# Patient Record
Sex: Female | Born: 1945 | Race: Black or African American | Hispanic: No | Smoking: Never smoker
Health system: Southern US, Community
[De-identification: ages and names within clinical notes are randomized; demographics above are authoritative.]

## PROBLEM LIST (undated history)

## (undated) DIAGNOSIS — E039 Hypothyroidism, unspecified: Secondary | ICD-10-CM

## (undated) DIAGNOSIS — A4902 Methicillin resistant Staphylococcus aureus infection, unspecified site: Secondary | ICD-10-CM

## (undated) DIAGNOSIS — R569 Unspecified convulsions: Secondary | ICD-10-CM

## (undated) DIAGNOSIS — I739 Peripheral vascular disease, unspecified: Secondary | ICD-10-CM

## (undated) DIAGNOSIS — F32A Depression, unspecified: Secondary | ICD-10-CM

## (undated) DIAGNOSIS — Z86718 Personal history of other venous thrombosis and embolism: Secondary | ICD-10-CM

## (undated) DIAGNOSIS — D649 Anemia, unspecified: Secondary | ICD-10-CM

## (undated) DIAGNOSIS — I639 Cerebral infarction, unspecified: Secondary | ICD-10-CM

## (undated) DIAGNOSIS — R130 Aphagia: Secondary | ICD-10-CM

## (undated) DIAGNOSIS — Z789 Other specified health status: Secondary | ICD-10-CM

## (undated) DIAGNOSIS — F329 Major depressive disorder, single episode, unspecified: Secondary | ICD-10-CM

## (undated) DIAGNOSIS — I82409 Acute embolism and thrombosis of unspecified deep veins of unspecified lower extremity: Secondary | ICD-10-CM

## (undated) DIAGNOSIS — I779 Disorder of arteries and arterioles, unspecified: Secondary | ICD-10-CM

## (undated) DIAGNOSIS — K5790 Diverticulosis of intestine, part unspecified, without perforation or abscess without bleeding: Secondary | ICD-10-CM

## (undated) DIAGNOSIS — I10 Essential (primary) hypertension: Secondary | ICD-10-CM

## (undated) HISTORY — PX: FEMORAL-POPLITEAL BYPASS GRAFT: SHX937

## (undated) HISTORY — DX: Personal history of other venous thrombosis and embolism: Z86.718

## (undated) HISTORY — DX: Methicillin resistant Staphylococcus aureus infection, unspecified site: A49.02

## (undated) HISTORY — DX: Unspecified convulsions: R56.9

## (undated) HISTORY — PX: ABOVE KNEE LEG AMPUTATION: SUR20

---

## 2001-07-31 ENCOUNTER — Encounter: Payer: Self-pay | Admitting: *Deleted

## 2001-07-31 ENCOUNTER — Inpatient Hospital Stay (HOSPITAL_COMMUNITY): Admission: EM | Admit: 2001-07-31 | Discharge: 2001-08-11 | Payer: Self-pay | Admitting: *Deleted

## 2001-07-31 ENCOUNTER — Encounter: Payer: Self-pay | Admitting: Family Medicine

## 2001-08-02 ENCOUNTER — Encounter: Payer: Self-pay | Admitting: Family Medicine

## 2001-08-11 ENCOUNTER — Inpatient Hospital Stay (HOSPITAL_COMMUNITY)
Admission: AD | Admit: 2001-08-11 | Discharge: 2001-09-02 | Payer: Self-pay | Admitting: Physical Medicine & Rehabilitation

## 2001-08-12 ENCOUNTER — Encounter: Admission: RE | Admit: 2001-08-12 | Discharge: 2001-08-12 | Payer: Self-pay | Admitting: Family Medicine

## 2001-08-14 ENCOUNTER — Encounter: Payer: Self-pay | Admitting: Physical Medicine & Rehabilitation

## 2001-08-25 ENCOUNTER — Encounter: Payer: Self-pay | Admitting: Physical Medicine & Rehabilitation

## 2001-10-13 ENCOUNTER — Ambulatory Visit (HOSPITAL_COMMUNITY)
Admission: RE | Admit: 2001-10-13 | Discharge: 2001-10-13 | Payer: Self-pay | Admitting: Physical Medicine & Rehabilitation

## 2001-10-13 ENCOUNTER — Encounter: Payer: Self-pay | Admitting: Physical Medicine & Rehabilitation

## 2001-10-25 ENCOUNTER — Encounter: Payer: Self-pay | Admitting: Emergency Medicine

## 2001-10-25 ENCOUNTER — Observation Stay (HOSPITAL_COMMUNITY): Admission: EM | Admit: 2001-10-25 | Discharge: 2001-10-26 | Payer: Self-pay

## 2001-10-25 ENCOUNTER — Encounter: Payer: Self-pay | Admitting: Internal Medicine

## 2001-11-02 ENCOUNTER — Encounter: Payer: Self-pay | Admitting: Emergency Medicine

## 2001-11-02 ENCOUNTER — Inpatient Hospital Stay (HOSPITAL_COMMUNITY): Admission: EM | Admit: 2001-11-02 | Discharge: 2001-11-07 | Payer: Self-pay | Admitting: Emergency Medicine

## 2001-11-02 ENCOUNTER — Encounter: Payer: Self-pay | Admitting: Internal Medicine

## 2001-11-06 ENCOUNTER — Encounter (INDEPENDENT_AMBULATORY_CARE_PROVIDER_SITE_OTHER): Payer: Self-pay | Admitting: Cardiology

## 2001-11-19 ENCOUNTER — Inpatient Hospital Stay (HOSPITAL_COMMUNITY): Admission: EM | Admit: 2001-11-19 | Discharge: 2001-11-26 | Payer: Self-pay | Admitting: Emergency Medicine

## 2002-06-19 ENCOUNTER — Emergency Department (HOSPITAL_COMMUNITY): Admission: EM | Admit: 2002-06-19 | Discharge: 2002-06-19 | Payer: Self-pay | Admitting: Emergency Medicine

## 2002-06-19 ENCOUNTER — Encounter: Payer: Self-pay | Admitting: Emergency Medicine

## 2002-08-14 ENCOUNTER — Ambulatory Visit (HOSPITAL_COMMUNITY): Admission: RE | Admit: 2002-08-14 | Discharge: 2002-08-14 | Payer: Self-pay | Admitting: Neurology

## 2002-08-14 ENCOUNTER — Encounter: Payer: Self-pay | Admitting: Neurology

## 2002-08-26 ENCOUNTER — Ambulatory Visit (HOSPITAL_COMMUNITY): Admission: RE | Admit: 2002-08-26 | Discharge: 2002-08-26 | Payer: Self-pay | Admitting: Neurology

## 2002-09-25 ENCOUNTER — Inpatient Hospital Stay (HOSPITAL_COMMUNITY): Admission: EM | Admit: 2002-09-25 | Discharge: 2002-09-26 | Payer: Self-pay | Admitting: Emergency Medicine

## 2002-09-25 ENCOUNTER — Encounter: Payer: Self-pay | Admitting: Emergency Medicine

## 2002-09-26 ENCOUNTER — Encounter: Payer: Self-pay | Admitting: Internal Medicine

## 2002-09-26 ENCOUNTER — Encounter: Payer: Self-pay | Admitting: Neurology

## 2003-02-11 ENCOUNTER — Emergency Department (HOSPITAL_COMMUNITY): Admission: AD | Admit: 2003-02-11 | Discharge: 2003-02-11 | Payer: Self-pay

## 2003-10-09 ENCOUNTER — Inpatient Hospital Stay (HOSPITAL_COMMUNITY): Admission: EM | Admit: 2003-10-09 | Discharge: 2003-10-14 | Payer: Self-pay

## 2004-01-26 ENCOUNTER — Ambulatory Visit: Payer: Self-pay | Admitting: *Deleted

## 2004-02-01 ENCOUNTER — Ambulatory Visit: Payer: Self-pay | Admitting: Family Medicine

## 2004-06-05 ENCOUNTER — Ambulatory Visit: Payer: Self-pay | Admitting: Family Medicine

## 2004-10-04 ENCOUNTER — Ambulatory Visit: Payer: Self-pay | Admitting: Family Medicine

## 2005-06-14 ENCOUNTER — Inpatient Hospital Stay (HOSPITAL_COMMUNITY): Admission: EM | Admit: 2005-06-14 | Discharge: 2005-07-18 | Payer: Self-pay

## 2005-06-14 ENCOUNTER — Ambulatory Visit: Payer: Self-pay | Admitting: Pulmonary Disease

## 2005-06-14 ENCOUNTER — Ambulatory Visit: Payer: Self-pay | Admitting: Internal Medicine

## 2005-08-15 ENCOUNTER — Ambulatory Visit: Payer: Self-pay | Admitting: Family Medicine

## 2005-09-10 ENCOUNTER — Encounter: Admission: RE | Admit: 2005-09-10 | Discharge: 2005-09-11 | Payer: Self-pay | Admitting: Family Medicine

## 2005-09-16 ENCOUNTER — Ambulatory Visit: Payer: Self-pay | Admitting: Cardiology

## 2005-09-16 ENCOUNTER — Ambulatory Visit: Payer: Self-pay | Admitting: Internal Medicine

## 2005-09-16 ENCOUNTER — Inpatient Hospital Stay (HOSPITAL_COMMUNITY): Admission: EM | Admit: 2005-09-16 | Discharge: 2005-09-21 | Payer: Self-pay | Admitting: Emergency Medicine

## 2005-09-17 ENCOUNTER — Encounter: Payer: Self-pay | Admitting: Cardiology

## 2005-10-04 ENCOUNTER — Inpatient Hospital Stay (HOSPITAL_COMMUNITY): Admission: EM | Admit: 2005-10-04 | Discharge: 2005-10-10 | Payer: Self-pay | Admitting: Emergency Medicine

## 2005-10-08 ENCOUNTER — Encounter (INDEPENDENT_AMBULATORY_CARE_PROVIDER_SITE_OTHER): Payer: Self-pay | Admitting: Specialist

## 2005-10-16 ENCOUNTER — Inpatient Hospital Stay (HOSPITAL_COMMUNITY): Admission: EM | Admit: 2005-10-16 | Discharge: 2005-10-19 | Payer: Self-pay | Admitting: Emergency Medicine

## 2006-03-12 ENCOUNTER — Emergency Department (HOSPITAL_COMMUNITY): Admission: EM | Admit: 2006-03-12 | Discharge: 2006-03-12 | Payer: Self-pay | Admitting: Emergency Medicine

## 2006-06-29 ENCOUNTER — Emergency Department (HOSPITAL_COMMUNITY): Admission: EM | Admit: 2006-06-29 | Discharge: 2006-06-29 | Payer: Self-pay | Admitting: Emergency Medicine

## 2006-12-17 DIAGNOSIS — R32 Unspecified urinary incontinence: Secondary | ICD-10-CM | POA: Insufficient documentation

## 2006-12-17 DIAGNOSIS — E1165 Type 2 diabetes mellitus with hyperglycemia: Secondary | ICD-10-CM

## 2006-12-17 DIAGNOSIS — I10 Essential (primary) hypertension: Secondary | ICD-10-CM | POA: Insufficient documentation

## 2006-12-17 DIAGNOSIS — E1149 Type 2 diabetes mellitus with other diabetic neurological complication: Secondary | ICD-10-CM

## 2006-12-17 DIAGNOSIS — Z8679 Personal history of other diseases of the circulatory system: Secondary | ICD-10-CM | POA: Insufficient documentation

## 2006-12-17 DIAGNOSIS — R569 Unspecified convulsions: Secondary | ICD-10-CM | POA: Insufficient documentation

## 2006-12-17 HISTORY — DX: Unspecified convulsions: R56.9

## 2007-02-14 ENCOUNTER — Emergency Department (HOSPITAL_COMMUNITY): Admission: EM | Admit: 2007-02-14 | Discharge: 2007-02-14 | Payer: Self-pay | Admitting: Emergency Medicine

## 2007-02-17 ENCOUNTER — Encounter (HOSPITAL_BASED_OUTPATIENT_CLINIC_OR_DEPARTMENT_OTHER): Admission: RE | Admit: 2007-02-17 | Discharge: 2007-05-06 | Payer: Self-pay | Admitting: Surgery

## 2007-04-18 ENCOUNTER — Inpatient Hospital Stay (HOSPITAL_COMMUNITY): Admission: EM | Admit: 2007-04-18 | Discharge: 2007-04-21 | Payer: Self-pay | Admitting: Emergency Medicine

## 2007-05-26 ENCOUNTER — Encounter (HOSPITAL_BASED_OUTPATIENT_CLINIC_OR_DEPARTMENT_OTHER): Admission: RE | Admit: 2007-05-26 | Discharge: 2007-06-17 | Payer: Self-pay | Admitting: Surgery

## 2007-07-13 ENCOUNTER — Emergency Department (HOSPITAL_COMMUNITY): Admission: EM | Admit: 2007-07-13 | Discharge: 2007-07-13 | Payer: Self-pay | Admitting: Emergency Medicine

## 2007-07-28 ENCOUNTER — Emergency Department (HOSPITAL_COMMUNITY): Admission: EM | Admit: 2007-07-28 | Discharge: 2007-07-28 | Payer: Self-pay | Admitting: Emergency Medicine

## 2007-11-12 ENCOUNTER — Inpatient Hospital Stay (HOSPITAL_COMMUNITY): Admission: EM | Admit: 2007-11-12 | Discharge: 2007-11-20 | Payer: Self-pay | Admitting: Emergency Medicine

## 2008-05-24 ENCOUNTER — Emergency Department (HOSPITAL_COMMUNITY): Admission: EM | Admit: 2008-05-24 | Discharge: 2008-05-24 | Payer: Self-pay | Admitting: Emergency Medicine

## 2008-10-12 ENCOUNTER — Ambulatory Visit (HOSPITAL_COMMUNITY): Admission: RE | Admit: 2008-10-12 | Discharge: 2008-10-12 | Payer: Self-pay | Admitting: *Deleted

## 2009-12-01 ENCOUNTER — Ambulatory Visit: Payer: Self-pay | Admitting: Vascular Surgery

## 2010-01-14 ENCOUNTER — Inpatient Hospital Stay (HOSPITAL_COMMUNITY): Admission: EM | Admit: 2010-01-14 | Discharge: 2010-01-20 | Payer: Self-pay | Admitting: Emergency Medicine

## 2010-02-01 ENCOUNTER — Ambulatory Visit: Payer: Self-pay | Admitting: Vascular Surgery

## 2010-03-01 ENCOUNTER — Telehealth: Payer: Self-pay | Admitting: Family Medicine

## 2010-04-19 ENCOUNTER — Emergency Department (HOSPITAL_COMMUNITY)
Admission: EM | Admit: 2010-04-19 | Discharge: 2010-04-19 | Payer: Self-pay | Source: Home / Self Care | Admitting: Emergency Medicine

## 2010-05-30 NOTE — Progress Notes (Signed)
Summary: PT RE-EST?  Phone Note Call from Patient   Caller: Patient's caregiver  [phone] Summary of Call: Dorinda Hill at [facility].... called to set up appt with Dr Clent Ridges for pt - Adv that pt will need to have order for abdominal US?.... Pt hasn't been seen since 2007 [facility].... Can you advise?.....[phone].  Initial call taken by: Debbra Riding,  March 01, 2010 10:32 AM  Follow-up for Phone Call        sorry but I am not taking new medicare pts now Follow-up by: Nelwyn Salisbury MD,  March 01, 2010 1:38 PM  Additional Follow-up for Phone Call Additional follow up Details #1::        Lft msg for Dorinda Hill at [phone] advising same.   Additional Follow-up by: Debbra Riding,  March 01, 2010 1:55 PM

## 2010-07-13 LAB — CBC
HCT: 31.6 % — ABNORMAL LOW (ref 36.0–46.0)
HCT: 35.1 % — ABNORMAL LOW (ref 36.0–46.0)
Hemoglobin: 11.6 g/dL — ABNORMAL LOW (ref 12.0–15.0)
MCHC: 33 g/dL (ref 30.0–36.0)
MCV: 84.2 fL (ref 78.0–100.0)
Platelets: 303 10*3/uL (ref 150–400)
Platelets: 377 10*3/uL (ref 150–400)
Platelets: 433 10*3/uL — ABNORMAL HIGH (ref 150–400)
RDW: 13.9 % (ref 11.5–15.5)
RDW: 14.5 % (ref 11.5–15.5)
RDW: 14.6 % (ref 11.5–15.5)
WBC: 10.3 10*3/uL (ref 4.0–10.5)
WBC: 10.9 10*3/uL — ABNORMAL HIGH (ref 4.0–10.5)

## 2010-07-13 LAB — DIFFERENTIAL
Lymphs Abs: 1 10*3/uL (ref 0.7–4.0)
Monocytes Absolute: 0.4 10*3/uL (ref 0.1–1.0)
Monocytes Relative: 4 % (ref 3–12)
Neutro Abs: 9.3 10*3/uL — ABNORMAL HIGH (ref 1.7–7.7)
Neutrophils Relative %: 86 % — ABNORMAL HIGH (ref 43–77)

## 2010-07-13 LAB — BASIC METABOLIC PANEL
BUN: 31 mg/dL — ABNORMAL HIGH (ref 6–23)
BUN: 53 mg/dL — ABNORMAL HIGH (ref 6–23)
BUN: 56 mg/dL — ABNORMAL HIGH (ref 6–23)
CO2: 22 mEq/L (ref 19–32)
CO2: 25 mEq/L (ref 19–32)
Calcium: 8.6 mg/dL (ref 8.4–10.5)
Calcium: 9.3 mg/dL (ref 8.4–10.5)
Chloride: 112 mEq/L (ref 96–112)
Chloride: 113 mEq/L — ABNORMAL HIGH (ref 96–112)
Creatinine, Ser: 1.3 mg/dL — ABNORMAL HIGH (ref 0.4–1.2)
Creatinine, Ser: 2.03 mg/dL — ABNORMAL HIGH (ref 0.4–1.2)
Creatinine, Ser: 3.11 mg/dL — ABNORMAL HIGH (ref 0.4–1.2)
GFR calc Af Amer: 41 mL/min — ABNORMAL LOW (ref >60)
GFR calc non Af Amer: 15 mL/min — ABNORMAL LOW (ref >60)
GFR calc non Af Amer: 25 mL/min — ABNORMAL LOW (ref >60)
GFR calc non Af Amer: 27 mL/min — ABNORMAL LOW (ref >60)
GFR calc non Af Amer: 34 mL/min — ABNORMAL LOW (ref >60)
Glucose, Bld: 144 mg/dL — ABNORMAL HIGH (ref 70–99)
Glucose, Bld: 251 mg/dL — ABNORMAL HIGH (ref 70–99)
Glucose, Bld: 280 mg/dL — ABNORMAL HIGH (ref 70–99)
Glucose, Bld: 85 mg/dL (ref 70–99)
Potassium: 3.3 mEq/L — ABNORMAL LOW (ref 3.5–5.1)
Potassium: 3.6 mEq/L (ref 3.5–5.1)
Potassium: 5.1 mEq/L (ref 3.5–5.1)
Sodium: 138 mEq/L (ref 135–145)

## 2010-07-13 LAB — GLUCOSE, CAPILLARY
Glucose-Capillary: 122 mg/dL — ABNORMAL HIGH (ref 70–99)
Glucose-Capillary: 136 mg/dL — ABNORMAL HIGH (ref 70–99)
Glucose-Capillary: 143 mg/dL — ABNORMAL HIGH (ref 70–99)
Glucose-Capillary: 172 mg/dL — ABNORMAL HIGH (ref 70–99)
Glucose-Capillary: 191 mg/dL — ABNORMAL HIGH (ref 70–99)
Glucose-Capillary: 216 mg/dL — ABNORMAL HIGH (ref 70–99)
Glucose-Capillary: 239 mg/dL — ABNORMAL HIGH (ref 70–99)
Glucose-Capillary: 271 mg/dL — ABNORMAL HIGH (ref 70–99)
Glucose-Capillary: 289 mg/dL — ABNORMAL HIGH (ref 70–99)
Glucose-Capillary: 308 mg/dL — ABNORMAL HIGH (ref 70–99)
Glucose-Capillary: 335 mg/dL — ABNORMAL HIGH (ref 70–99)
Glucose-Capillary: 353 mg/dL — ABNORMAL HIGH (ref 70–99)
Glucose-Capillary: 355 mg/dL — ABNORMAL HIGH (ref 70–99)
Glucose-Capillary: 56 mg/dL — ABNORMAL LOW (ref 70–99)
Glucose-Capillary: 75 mg/dL (ref 70–99)

## 2010-07-13 LAB — COMPREHENSIVE METABOLIC PANEL
ALT: 22 U/L (ref 0–35)
AST: 56 U/L — ABNORMAL HIGH (ref 0–37)
Albumin: 3.1 g/dL — ABNORMAL LOW (ref 3.5–5.2)
Alkaline Phosphatase: 41 U/L (ref 39–117)
Potassium: 4.4 mEq/L (ref 3.5–5.1)
Sodium: 143 mEq/L (ref 135–145)
Total Protein: 7.3 g/dL (ref 6.0–8.3)

## 2010-07-13 LAB — URINE CULTURE
Colony Count: NO GROWTH
Culture  Setup Time: 201109171108
Culture: NO GROWTH

## 2010-07-13 LAB — URINALYSIS, ROUTINE W REFLEX MICROSCOPIC
Bilirubin Urine: NEGATIVE
Ketones, ur: NEGATIVE mg/dL
Nitrite: NEGATIVE
Protein, ur: >300 mg/dL — AB
Urobilinogen, UA: 1 mg/dL (ref 0.0–1.0)

## 2010-07-13 LAB — CULTURE, BLOOD (ROUTINE X 2): Culture  Setup Time: 201109171107

## 2010-07-13 LAB — MRSA CULTURE

## 2010-07-13 LAB — LACTIC ACID, PLASMA: Lactic Acid, Venous: 1.4 mmol/L (ref 0.5–2.2)

## 2010-07-13 LAB — URINE MICROSCOPIC-ADD ON

## 2010-07-13 LAB — MRSA PCR SCREENING

## 2010-08-02 ENCOUNTER — Ambulatory Visit: Payer: Self-pay | Admitting: Vascular Surgery

## 2010-09-12 NOTE — Assessment & Plan Note (Signed)
[facility]   NAME:  Allison Washington NO.:  [id number]   MEDICAL RECORD NO.:  [id number]      DATE OF BIRTH:  Oct 21, 1945   PHYSICIAN:  Jake Shark A. Tanda Rockers, M.D. VISIT DATE:  06/09/2007                                   OFFICE VISIT   SUBJECTIVE:  Allison Washington is a 65 year old female who we are seeing  for combined arterial insufficiency ulcers and stasis ulcer involving  her right lower extremity.  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  In the interim she has used antiseptic soap wash  and daily application of triamcinolone cream.  She is mobile using a  motorized wheelchair.  There has been no interim fever.  She is  accompanied by a driver only.   OBJECTIVE:  Blood pressure is 117/74, respirations 18, pulse rate 98,  temperature 98.3.  Capillary blood glucose is 142 mg percent.  Inspection of the left lower extremity shows that the ulcers labeled 1A  and 2A have soft eschars.  There is no drainage, there is no evidence of  active infection.  The capillary refill is sluggish and consistent with  a known level of vascular insufficiency.  The foot is warm, but it is  certainly not ischemic in appearance.  The motor ability is preserved.  No debridement is needed.   ASSESSMENT:  Stable arterial and venous ulcerations.   PLAN:  We will continue daily antiseptic soap wash with triamcinolone  cream application.  We have written these instructions for the benefit  of the nursing staff at [facility].  We will discharge this patient  from active followup in the wound center.  We have indicated that we are  willing to reevaluate her on a p.r.n. basis or if the institution  requires that we perform all of her care.  Otherwise, these wound care  orders may be executed by the staff at [facility]      Harold A. Tanda Rockers, M.D.  Electronically Signed     HAN/MEDQ  D:  06/09/2007  T:  06/10/2007  Job:  [id number]   cc:   Maxwell Caul, M.D.

## 2010-09-12 NOTE — Assessment & Plan Note (Signed)
[facility]   NAME:  TALULAH, SCHIRMER            ACCOUNT NO.:  [id number]   MEDICAL RECORD NO.:  [id number]      DATE OF BIRTH:  02/25/1946   PHYSICIAN:  Jake Shark A. Tanda Rockers, M.D. VISIT DATE:  03/06/2007                                   OFFICE VISIT   SUBJECTIVE:  Ms. Iseminger is a 65 year old female who we have followed  for stasis ulcer involving the left lower extremity.  In the interim,  the patient has been treated with external compression and triamcinolone  ointment.  She has been keeping the leg elevated.  There has been no  interim pain or fever or excessive drainage.   SUBJECTIVE:  Blood pressure is 145/68, respirations 18, pulse rate 93,  temperature is 99.0.  Capillary blood glucose is 158 mg percent.  Inspection of the lower extremity shows that there has been significant  decrease in volume and area of the wound.  There is no evidence of  active infection.  The patient has responded to external compression.   ASSESSMENT:  Near resolved stasis ulcer.   PLAN:  We will discharge the patient from active follow-up in the Wound  Center.  She will be returned to the nursing facility with instructions  to continue to apply the triamcinolone ointment daily and to apply a 4x4  and an Ace wrap for compression.  We anticipate that this wound should  heal without difficulty.  The patient is discharged.      Harold A. Tanda Rockers, M.D.  Electronically Signed     HAN/MEDQ  D:  03/06/2007  T:  03/06/2007  Job:  [id number]   cc:   Maxwell Caul, M.D.

## 2010-09-12 NOTE — Assessment & Plan Note (Signed)
[facility]   NAME:  Allison Washington, Allison Washington            ACCOUNT NO.:  [id number]   MEDICAL RECORD NO.:  [id number]      DATE OF BIRTH:  27-Jan-1946   PHYSICIAN:  Jonelle Sports. Sevier, M.D.  VISIT DATE:  04/16/2007                                   OFFICE VISIT   HISTORY:  This 65 year old female is followed for stasis ulceration on  the anterior aspect of the left leg, which was thought to have healed  approximately 1 month ago.  She is in a chronic care facility, and they  noted that she had apparently sustained a very superficial abrasion  above this area on the left pretibial area that has been somewhat weepy.  Accordingly, they have referred her back for our evaluation and advice.  On arrival here, she clearly has a recurrence of the ulcer in the  original place and it is filled with a fibrous semi-necrotic slough.   The patient reports no pain.  No fever or systemic symptoms.  She is  unaware of any significant odor and there has been very little drainage  from the primary wound but some just simple weeping from the secondary  wound.   PHYSICAL EXAMINATION:  Blood pressure is 108/61, pulse 90, respirations  16, temperature 98.2.   On the lower anterior aspect of the left pretibial area is an ulcer  measuring 1.2 x 1.0 x 0.25 cm filled with purulent fibrinous semi-  necrotic slough.  There is some surrounding erythema here, but there is  absolutely no warmth and it does not appear to be infectious in nature.   On the higher pretibial area, there is an area now measuring  approximately 3 x 3 cm that is just superficially denuded and there is  some slight weeping.  Again, there is no evidence of significant  infection.   IMPRESSION:  Stasis ulceration, left lower extremity, in a woman whose  circulatory status will not permit compressive therapy.   DISPOSITION:  The primary wound is sharply debrided to the extent that  it can be tolerated of a considerable amount  of this fibrinous semi-  necrotic material.  The base of the wound is cross-hatched.  The wound  is subjected to an application of Accuzyme ointment covered by a Sofsorb  pad.   The secondary wound is covered with a silver line pad and then that  extremity is wrapped lightly in a Kerlix wrap, over which it is placed  in an Ace wrap from the base of the toes to the knee.   The receiving facility is advised to change the dressing every 2 days,  using Santyl ointment in the primary wound in place of the  Accuzyme.  They will also continue silver line application to secondary wound and  will keep that extremity wrapped in an Ace wrap and elevated as much as  possible.   Followup visit here will be in 2 weeks.          ______________________________  Jonelle Sports Cheryll Cockayne, M.D.    RES/MEDQ  D:  04/16/2007  T:  04/16/2007  Job:  [id number]

## 2010-09-12 NOTE — Discharge Summary (Signed)
NAME:  Allison Washington, Allison Washington            ACCOUNT NO.:  [id number]   MEDICAL RECORD NO.:  [id number]          PATIENT TYPE:  INP   LOCATION:  1337                         FACILITY:  [facility]   PHYSICIAN:  Herbie Saxon, MDDATE OF BIRTH:  09/17/1945   DATE OF ADMISSION:  11/11/2007  DATE OF DISCHARGE:                               DISCHARGE SUMMARY   DISCHARGE DIAGNOSES:  1. Hypoglycemic episode resolved.  2. Diabetes improved control.  3. Dehydration resolved.  4. Anemia of chronic disease with iron deficiency.  5. Mild urinary tract infection.  6. Methicillin-resistant Staphylococcus aureus wound infection.  7. Sacral, left leg ulcers.  8. 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  9. 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  10.Hypertension stable.  11.History of coronary artery disease.  12.History of hyperlipidemia on statin.  13.History of diverticulosis.  14.Also has a history of gastrointestinal bleed.  15.Status epilepticus.   RADIOLOGY:  The renal ultrasound of November 15, 2007, shows normal course  apparent of the kidneys.  Chest x-ray of November 11, 2007, shows no acute  findings with chronic interstitial change noted.   HOSPITAL COURSE:  This 65 year old female who is an assisted living  facility resident presented to emergency room with low blood sugar of  44, given IV dextrose and sugar rose to 200.  Patient was initially  having dizzy spells prior to presentation.  Patient admitted and started  on q.4 hourly glucose Cheks with history of mild UTI on the urinalysis  and mild leukocytosis, started on Avelox.  Also noticed to have a mild  dehydration, occurring  heart failure.  This is improving with the IV  fluid hydration with 1/2 normal saline.  Sacral right buttock blisters  were noted and wound culture was sent which was positive for MRSA.  Her  antibiotic coverage has been widened to cover against  pseudomonas and  MRSA.  She is presently on IV Cipro and vancomycin.  Also noted to have  elevated TSH on November 13, 2007.  Levothyroxine low-dose 25 mg daily has  been added to medication regimen.  A TSH is to be repeated in 6 weeks as  outpatient at the assisted living facility.  Patient is being followed  by the wound care nurse and she has been Aquagel application to left  heel and sacral ulcers.  Initially urticaria has improved.  Renal  function is back to baseline.  Currently she is on contact precautions.  Planning to discharge to the nursing facility next 48 hours.  Patient  started on IV vancomycin.  This is to be continued in the nursing  facility.   PHYSICAL EXAMINATION:  On examination today,elderly lady not in acute  distress.  Temperature 97.  Pulse 70.  Respiratory rate is 19.  Blood pressure  130/48.  Pupils equal, reactive to light and accommodation.  Has expressive  aphasia and dysarthria.  NECK:  Supple.  Oropharynx, nasopharynx are clear.  HEAD:  Atraumatic normocephalic.  CHEST:  Clear clinically.  Heart sounds are 1 and 2 regular rate and  rhythm.  No  rubs, gallops or murmurs.  ABDOMEN:  Benign.  He is alert, oriented x3 with a right above-knee amputation.  Stage 2  decubitus in the right left heel stage 2 ulcers.  Peripheral pulses  reduced.  No pedal edema.   LABORATORIES:  Wound cultures November 14, 2007, shows a moderate MRSA.  Chemistry of November 17, 2007, sodium is 140, potassium 4.2, chloride 109,  bicarbonate 26, glucose 156, BUN 23, creatinine 1.1, BBC is 7.7,  hematocrit 31, platelets 327.   PLAN:  Discontinue intravenous hydration, intravenous KVO, and Coreg.  Liberal oral fluids.  CSW input in arranging discharge plan to the  nursing facility in the next 24-48 hours.  Discharge medication  previously dictated at final discharge.      Herbie Saxon, MD  Electronically Signed     MIO/MEDQ  D:  11/17/2007  T:  11/17/2007  Job:  [id number]

## 2010-09-12 NOTE — Consult Note (Signed)
NAME:  Allison Washington, Allison Washington            ACCOUNT NO.:  [id number]   MEDICAL RECORD NO.:  [id number]          PATIENT TYPE:  REC   LOCATION:  FOOT                         FACILITY:  [facility]   PHYSICIAN:  Theresia Majors. Tanda Rockers, M.D.DATE OF BIRTH:  1945-06-23   DATE OF CONSULTATION:  DATE OF DISCHARGE:                                 CONSULTATION   REASON FOR CONSULTATION:  Allison Washington is a 65 year old diabetic who is  referred by Allison Washington for evaluation of bilateral ulcers  referred for the evaluation of an ulceration of the left lower  extremity.   IMPRESSION:  Combined arterial insufficiency and venous stasis  ulceration.   RECOMMENDATIONS:  Proceed with a full battery of noninvasive studies to  ascertain the relative contribution of arterial insufficiency.  In the  interim use a mild to no compression wrap instituting continuous  elevation of the left leg to control edema.  The patient will require  serial exams and possibly a more aggressive debridement as the areas of  slough are resolved.   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  She has been a resident of the nursing care facility for  the past 2 years.   The current also has been present for 2 weeks and was initiated with a  pump to the underside of the bed while utilizing a motorized wheelchair.  There has been no interim fever.  There has been moderate pain.  Edema  has decreased somewhat from the initial injury.   PAST MEDICAL HISTORY:  Her past medical history is remarkable for  previous peripheral vascular disease, hypertension.  She has had  multiple strokes.  She has suffered with diabetes for over a decade.   ALLERGIES:  She has no known allergies.   MEDICATIONS:  Her current medications include Lantus insulin 40 units in  the  morning and 45 in the evening, Detrol LA 4 mg daily, KCl 20 mEq  daily, simvastatin 80 mEq h.s., MiraLax 17 grams daily, Colace 100 mg  daily, multiple vitamins one daily, vitamin C 500 mg b.i.d., Mucinex 600  mg q.12h., Tricor 145 mg daily, omeprazole 20 mg daily, lisinopril 40  mEq daily, Norvasc 10 mEq daily, labetalol 20 mg b.i.d., and clonidine  0.1 mg b.i.d.   PAST SURGICAL HISTORY:  Previous surgeries included a cholecystectomy  and an above-the-knee amputation of the right leg for suppurative  complications.   FAMILY HISTORY:  Her family history is positive for diabetes, stroke and  cancer.   SOCIAL HISTORY:  Socially she is a widow.  She has two children who live  in the local area and who visit her. She is a retired Engineer, civil (consulting).   REVIEW OF SYSTEMS:  She has never smoked.  She is ambulatory minimally  which involves shifting from the bed to her motorized wheelchair.  She  does not do any cooking or washing and needs assistance with her  bathing.  She has had significant impediment to speaking.  Her visual  acuity has decreased.  She specifically denies angina pectoris.  There  are no bowel or bladder complaints.  Her weight is stable.  Her diabetes  has been under reasonable control with the previously described regimen.  She has had episodes of hematochezia and has had extensive workup at  [facility] with upper and lower endoscopy.  She has received  transfusions in the course of the management of her gastrointestinal  hemorrhage.  The remainder of the review of systems is negative.   PHYSICAL EXAMINATION:  GENERAL APPEARANCE: On physical exam she is an  alert, chronically ill female in a motorized wheelchair.  She is in no  acute distress.  She appears to be alert and oriented to time, place and  person.  VITAL SIGNS:  Blood pressure is 149/73, respirations 18, pulse rate 97,  temperature is 98.6. Capillary blood glucose is 108 mg percent.  HEENT:  Exam is remarkable for a  flattened facial crease on the left.  The patient has significant difficulty in phonation and expressive  aphasia.  NECK:  The neck is supple. The trachea is midline.  There are large  airway sounds with rhonchi which clear with cough.  CARDIORESPIRATORY:  The heart sounds were distant.  The lung fields in  general are clear.  ABDOMEN:  The abdomen is soft.  There is a healed right above-the-knee  amputation stump. On the left there is taut 3+ edema with hyperemia.  Capillary refill is sluggish.  There are multiple excoriated areas with  ulcerations over the anterior lateral shin. There is no evidence of  ascending infection.  The capillary refill is sluggish.  The ankle-  brachial index calculated in the clinic is 0.50 with extremely dampened  monophasic signals.  NEUROLOGICAL:  The patient is insensate to the [employer].   DISCUSSION:  This 65 year old diabetic has dependent edema in her  remaining left lower extremity.  She has a post-traumatic stasis ulcer  at this point.  We will judiciously balance compression to control edema  and avoid arterial compromise.  We have explained this approach to the  patient in terms that she seems to understand.  The patient also informs  me that she has been extensively evaluated vascular wise both here and  in [place] and has not been advised to undergo surgery.  If there is  any deterioration in her wound care over the next visit, we will  recommend a confirmatory vascular consult.      Harold A. Tanda Rockers, M.D.  Electronically Signed     HAN/MEDQ  D:  02/19/2007  T:  02/20/2007  Job:  [id number]   cc:   Maxwell Caul, M.D.

## 2010-09-12 NOTE — Consult Note (Signed)
NAME:  KETARA, CAVNESS NO.:  [id number]   MEDICAL RECORD NO.:  [id number]          PATIENT TYPE:  EMS   LOCATION:  MAJO                         FACILITY:  [facility]   PHYSICIAN:  Pramod P. Pearlean Brownie, MD    DATE OF BIRTH:  1946/02/19   DATE OF CONSULTATION:  07/13/2007  DATE OF DISCHARGE:  07/13/2007                                 CONSULTATION   REASON FOR CONSULTATION:  Code Stroke.   HISTORY OF PRESENT ILLNESS:  Ms. Dillingham is a 65 year old African-  American lady who was brought in by the family with new onset of slurred  speech and left sided weakness. History was provided to the physician by  the patient's daughter-in-law, who unfortunately is not available at  present to corroborate the history. The patient also tells me that she  woke up at 6:30 in the morning and she had some pain on the left side of  the face, arm, leg, and chest and felt her speech was a little worse  than her baseline dysarthria. She denies any increasing weakness to me  but apparently the daughter-in-law felt she was weaker on the left. The  patient has a history of multiple one or two strokes and has been living  in a nursing home. She in fact, came back home yesterday from the  nursing home. The patient's family felt that they were not ready but the  patient insisted because there was a new grandchild that she wanted to  see. She apparently spent the night on the sofa bed. There was an  altercation last night, an argument with her son, which was upsetting to  her. The patient denies at present, any headache, any double vision, any  increased weakness.   PAST MEDICAL HISTORY:  1. Multiple strokes.  2. Diabetes mellitus.  3. Hypertension.  4. Diverticulitis.  5. Coronary artery disease.  6. Right above the knee amputation.   MEDICATIONS:  Tylenol, Lisinopril, MiraLax, Benadryl, Clonidine,  multivitamins, Colace, Norvasc, Detrol, Prilosec, __________ ,  Zocor,  potassium, Labetalol,  Lantus insulin, vitamin C, Vicodin, Tylenol,  Zocor, and Prilosec.   SOCIAL HISTORY:  The patient has been living in a nursing home for 2  years. Just got home yesterday as stated above. She does not smoke or  drink. She is unable to walk because of amputation. She uses a  wheelchair for mobility.   PHYSICAL EXAMINATION:  GENERAL:  A middle aged African-American lady who  is at the present, not in distress.  VITAL SIGNS:  She is afebrile with pulse rate of 91 per minute,  respiratory rate 22, blood pressure 181/78. Saturations 96% on room air.  HEENT:  Head is nontraumatic.  NECK:  Supple. No bruit.  CARDIAC:  Regular heart sounds.  LUNGS:  Clear to auscultation.  ABDOMEN:  Soft, nontender.  NEUROLOGIC:  The patient is awake, alert. She is oriented to person,  place, and time. She has severe dysarthria with she can be understood.  She has pseudobulbar speech. Her eye movements are full range. There is  minimum right lower facial asymmetry. Tongue midline. Her tongue  movements  are slow. Motor system examination reveals no upper extremity  drift. She has intact finger to nose and coordination. She has right  above the knee amputation. Left leg strength seems good. She has  decreased sensation to touch and pinprick in the left body. There is no  sensory extinction. On a NIH stroke scale, she is code 2. Her jaw jerk  is brisk. Deep tendon reflexes are brisk bilaterally. Plantar's are  downgoing. Gait was not tested.   LABORATORY DATA:  Non-contrast CT scan of the head shows multiple remote  age lacunar infarcts involving the pons and 1 or 2 subcortical white  matter and mild generalized atrophy and white matter changes, which are  age disproportionate.   CBC is normal. BMP is pending. UA is pending.   IMPRESSION:  A 65 year old lady with known history of bilateral  subcortical strokes and pseudobulbar state with transient worsening of  her speech, questionable increased left sided  weakness of unclear  etiology. I doubt this presents a new large stroke and due to the  minimum NIH stroke scale and poor neurological baseline and unclear  history, I would not consider invasive intervention with TPA. The  patient's family is unfortunately not available at the present time to  corroborate the history. She can be hydrated aggressively and check a UA  for infection. If she returns to baseline, she may go back home if the  family is able to take care of her. Otherwise, she may need to go back  to the nursing home, if they cannot take care of her. She will continue  aspirin for secondary stroke prevention and with treatment of blood  pressure, diabetes, and cholesterol. Apparent no future strokes.           ______________________________  Sunny Schlein. Pearlean Brownie, MD     PPS/MEDQ  D:  07/13/2007  T:  07/13/2007  Job:  [id number]

## 2010-09-12 NOTE — H&P (Signed)
NAME:  LENOIR, FACCHINI NO.:  [id number]   MEDICAL RECORD NO.:  [id number]          PATIENT TYPE:  OBV   LOCATION:  1337                         FACILITY:  [facility]   PHYSICIAN:  Della Goo, M.D. DATE OF BIRTH:  Oct 18, 1945   DATE OF ADMISSION:  11/11/2007  DATE OF DISCHARGE:                              HISTORY & PHYSICAL   PRIMARY CARE PHYSICIAN:  Unassigned.   CHIEF COMPLAINT:  Low blood sugar.   HISTORY OF THE PRESENT ILLNESS:  This is a 65 year old female who  resides at an area assisted living center who was sent to the emergency  department secondary to an episode of low blood sugar.  The patient was  evaluated in the emergency department and was found to have a blood  sugar level of 44 and the patient was administered IV dextrose times one  dosage, and had improvement to a blood sugar of 200.  The patient was  further evaluated.  The patient herself has dysarthria and severe  difficulty speaking secondary to five previous cerebrovascular  accidents, and is able to write down her thoughts.  The patient has  complaints of having dizziness.  She denies having any pain.   PAST MEDICAL AND SURGICAL HISTORY:  The past medical history is  significant for:  1. Previous cerebrovascular accidents times five.  2. History of Type 2 diabetes mellitus.  3. Hypertension.  4. History of peripheral vascular disease.  5. 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  6. Also history of coronary artery disease.  7. Diverticulosis/diverticulitis.   MEDICATIONS:  The patient's medications include:  1. Clonidine 0.1 mg by mouth twice a day.  2. Labetalol 200 mg 1 by mouth twice a day.  3. Lisinopril 40 mg 1 by mouth every day.  4. Simvastatin 40 mg 1 by mouth at bedtime.  5. Norvasc 10 mg 1 by mouth every day.  6. Duragesic patch 25 mcg 1 patch every 72 hours.  7. Detrol 4 mg 1 by mouth every day.  8. Colace 100 mg 1 by mouth every day.  9. Benadryl 25 mg 1 by  mouth every 6 hours as needed.  10.Lactescence insulin 45 units subcu at bedtime.  11.Vitamin C 500 mg 1 by mouth twice a day.  12.Multivitamin 1 by mouth every day.  13.MiraLax 17 grams in 8 ounces of fluid once daily.  14.Vicodin 5/500 mg 1 by mouth every 4 hours as needed for pain.  15.K-Dur 20 mEq 1 by mouth every day.   PAST SURGICAL HISTORY:  None.   ALLERGIES:  No known drug allergies.   SOCIAL HISTORY:  The patient resides in an area assisted living center.  She is a nonsmoker and a nondrinker.   FAMILY HISTORY:  The family history is noncontributory.   REVIEW OF SYSTEMS:  Pertinents are mentioned above.   PHYSICAL EXAMINATION:  The physical examination findings are:  GENERAL APPEARANCE:  This is a 65 year old obese, sickly female who is  debilitated and bed-bound, and in no acute distress currently.  VITAL SIGNS:  Temperature 98.5, blood pressure 116/59, heart rate 90,  respirations 18, and  O2 saturation 95-96%.  HEENT EXAMINATION:  Normocephalic and atraumatic.  There is a facial  droop on the right side.  Pupils are reactive to light bilaterally.  Extraocular movements are intact.  Oropharynx is clear.  There is tongue  deviation; all are chronic from her previous CVAs.  NECK:  The neck is supple with full range of motion.  No thyromegaly,  adenopathy or jugular venous distention.  HEART:  Cardiovascular reveals regular rate and rhythm.  No murmurs,  gallops or rubs.  LUNGS:  The lungs are clear to auscultation bilaterally.  ABDOMEN:  The abdomen is positive for an oblique scar in the right upper  quadrant consistent with a cholecystectomy.  Otherwise positive bowel  sounds, soft, nontender ad nondistended.  No hepatosplenomegaly.  No  rebound and no guarding.  EXTREMITIES:  The extremities show a right AKA present and the left  lower extremity without cyanosis, clubbing or edema.  There is atrophy.  NEUROLOGIC:  On neurologic examination patient is awake and  oriented.  She has significant dysarthria and expressive aphasia.  She has mild  movement in her right upper extremity.   LABORATORY STUDIES:  White blood cell count 10.9, hemoglobin 10.9,  hematocrit 32.9 and platelets 286,000 with neutrophils 66% and  lymphocytes 25% .  Sodium 142, potassium 4.5, chloride 110, bicarb 23,  BUN 52, creatinine 2.1, and glucose 44 on admission.  Cardiac enzymes  with a myoglobin of 441, CK/MB 3.3 and troponin less than 0.05.  Urinalysis with trace leukocyte esterase, but otherwise negative.  Chest  x-ray reveals no acute findings, but chronic interstitial changes are  seen.   ASSESSMENT:  This is a 65 year old female being admitted with:  1. Hypoglycemic episode.  2. Hypertension.  3. Coronary artery disease.  4. Anemia.  5. Type 2 diabetes mellitus.   PLAN:  1. The patient will be admitted and monitored for further changes in      her blood sugars.  2. The patient will continue on her regular medications at this time      and sliding scale insulin coverage.  3. The hypoglycemic protocol will be followed as needed low blood      sugars.  4. DVT and GI prophylaxes have also been ordered.  5. Case management will be requested for evaluation and placement of      the patient to a higher level of care to skilled nursing.      Della Goo, M.D.  Electronically Signed     HJ/MEDQ  D:  11/11/2007  T:  11/12/2007  Job:  [id number]

## 2010-09-12 NOTE — Assessment & Plan Note (Signed)
[facility]   NAME:  MAREENA, CAVAN            ACCOUNT NO.:  [id number]   MEDICAL RECORD NO.:  [id number]      DATE OF BIRTH:  [birth year]   PHYSICIAN:  Jake Shark A. Tanda Rockers, M.D. VISIT DATE:  02/26/2007                                   OFFICE VISIT   SUBJECTIVE:  Ms. Sant is a 65 year old female who we are following  for a left stasis ulcer.  Her ABI from her previous exam suggested  significant occlusive disease.  There has been no interim pain, fever or  malodor.   OBJECTIVE:  Blood pressure is 161/80, respirations 16, pulse 97,  temperature is 99.1.  Capillary blood glucose is 93 mg%.  Inspection of  the left lower extremity shows that the wounds have decreased in area  and volume.  There is advancing epithelium from the multiple areas.  There is no need for debridement.  The capillary refill is moderate.   ASSESSMENT:  Clinical improvement with mild compression and  triamcinolone ointment.   PLAN:  We will resume the noncompressive wrap while maintaining  elevation.  We will reevaluate the patient in 1 week.  With regard to  her vascular workup, we would defer the vascular referral at this point  as this patient would not be a candidate for revascularization due to  her relative immobility and the sequelae from her CVA.      Harold A. Tanda Rockers, M.D.  Electronically Signed     HAN/MEDQ  D:  02/26/2007  T:  02/26/2007  Job:  [id number]

## 2010-09-12 NOTE — Consult Note (Signed)
NEW PATIENT CONSULTATION   Allison, Washington  DOB:  Dec 30, 1945                                       12/01/2009  ZOXWR#:[id number]   I saw the patient in the office today in consultation concerning her  left leg pain.  She is referred by Dr. Chilton Si.  This patient has a  significant expressive aphasia related to a previous stroke and history  is somewhat difficult to obtain from her.  Her son is with her today,  and most of the history is obtained from him.  The pain in her left leg  began several months ago and came on gradually.  The pain is constant  and fairly severe.  She describes it as a cramping pain.  There are  really no aggravating or alleviating factors.  She does have some rest  pain of the left foot, which is relieved somewhat with dependency.  She  has had no history of nonhealing ulcers that she is aware of.   PAST MEDICAL HISTORY:  Significant for adult onset diabetes.  She does  require insulin.  In addition, she has hypertension and  hypercholesterolemia.  She has had a previous stroke associated with  dysarthria and expressive aphasia.  She also has a history of coronary  artery disease but denies any previous history of myocardial infarction  or history of congestive heart failure.  She has anemia of chronic  disease.   PAST SURGICAL HISTORY:  The patient underwent previous attempted  revascularization of the right leg in 2003 by Dr. Elyn Peers and then had a  right above-the-knee amputation by Dr. Arbie Cookey in 2007.   SOCIAL HISTORY:  She is married.  She is widowed.  She has 2 children.  She quit tobacco 10 years ago.   FAMILY HISTORY:  There is no history of premature cardiovascular  disease.   REVIEW OF SYSTEMS:  GENERAL:  She has had no recent weight loss, weight  gain, or problems with her appetite.  CARDIOVASCULAR:  She has had no recent chest pain, chest pressure,  palpitations or arrhythmias.  She does admit to some dyspnea on  exertion.   She has had no history of DVT according to her.  However,  according to our records, she has had a previous DVT.  GI:  She has had GI bleeding in the past.  She does not remember the  details.  She has had no recent change in her bowel habits.  NEUROLOGIC:  She has had a previous stroke.  She has had no recent  dizziness, blackouts, headaches or seizures.  PULMONARY, HEMATOLOGIC, GU, ENT, PSYCHIATRIC:  Review of systems is  unremarkable.  MUSCULOSKELETAL:  She does have some arthritis.   PHYSICAL EXAMINATION:  This is a pleasant 65 year old woman who appears  her stated age.  Her blood pressure is 106/62, heart rate is 92,  respiratory rate is 18.  HEENT:  Unremarkable.  Lungs:  Are clear  bilaterally to auscultation without rales, rhonchi or wheezing.  Cardiovascular exam:  I do not detect any carotid bruits.  She has a  regular rate and rhythm.  She has a diminished right femoral pulse.  I  cannot palpate a left femoral pulse.  She has a right above-the-knee  amputation, which is healed.  I cannot palpate popliteal or pedal pulses  on the left side.  She  has hyperpigmentation in the left calf and also  significant 3+ edema of the left calf.  Abdomen:  Soft and nontender  with normal-pitched bowel sounds.  No masses are appreciated.  Musculoskeletal exam:  She has a right AKA.  Neuro:  She has expressive  aphasia.  She has no focal weakness noted.  Skin:  She has  hyperpigmentation of the left leg with no open ulcers.   I did review her previous lab studies from her discharge summary in  2009, which showed a normal creatinine of 1.1.  I also reviewed her  culture from July, which showed MRSA.  This was from a sacral wound.  I  independently performed a Doppler study of her left leg today and was  unable to obtain Doppler flow in the posterior tibial, anterior tibial,  peroneal, or dorsalis pedis positions of the left leg.   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  She also has  evidence of significant chronic venous insufficiency.  Unfortunately,  the treatment for her leg swelling would be to elevate her legs.  However, this would worsen her ischemia, and therefore she cannot do  this.  She clearly has a limb-threatening problem.  We have discussed  the option of proceeding with arteriography to at least see what our  options for revascularization are.  However, at this point, she is not  willing to proceed.  I have offered this fairly aggressive approach  given that she feels quite strongly about not wanting to have an  amputation on the left side.  If she changes her mind about proceeding  with arteriography, certainly we can schedule this.  Unfortunately,  given her multilevel arterial occlusive disease, I suspect there are not  any simple options for revascularization and ultimately I think she will  likely require an above-the-knee amputation on the left.     Di Kindle. Edilia Bo, M.D.  Electronically Signed   CSD/MEDQ  D:  11/27/2009  T:  12/02/2009  Job:  [id number]   cc:   Lenon Curt. Chilton Si, M.D.

## 2010-09-12 NOTE — Assessment & Plan Note (Signed)
OFFICE VISIT   Allison, Washington  DOB:  [birth year]                                       02/01/2010  BJYNW#:[id number]   I saw Allison Washington in the office today for continued follow-up of her  left leg wounds.  I had seen in consultation on December 01, 2009.  At that  time, she had evidence of multilevel arterial occlusive disease and also  venous insufficiency.  I discussed proceeding with an arteriogram to at  least see what our options might be for revascularization although  clearly she would be at increased risk.  She did not wish to proceed  with arteriography.  She comes in today for continued follow-up.   Since I saw her last, she is able to communicate to the leg does still  hurt.  The pain increases with elevation and is relieved somewhat with  dependency.  She has had some moderate swelling in the left leg.   SOCIAL HISTORY:  She is widowed.  She has 2 children.  She quit tobacco  10 years ago.   REVIEW OF SYSTEMS:  CARDIOVASCULAR:  She has had no chest pain, chest  pressure, palpitations or arrhythmias.  PULMONARY:  She had no productive cough, bronchitis, asthma or wheezing.   PHYSICAL EXAMINATION:  This is a pleasant 65 year old woman who appears  her stated age.  Blood pressure is 115/65, heart rate 93 and respiratory rate 12.  LUNGS:  Clear bilaterally to auscultation.  CARDIAC:  She has a regular rate and rhythm.  She does have a palpable  right femoral pulse.  She has a right above-the-knee amputation.  On the  left side I cannot palpate a femoral pulse.  I was able to get a  dampened monophasic signals in the dorsalis pedis and posterior tibial  positions today which I was not able to get previously.  She has  hyperpigmentation and chronic venous insufficiency of the left leg.   I again explained that the challenge that she has is that the treatment  for chronic venous insufficiency is leg elevation.  However, given her  arterial  insufficiency, she cannot tolerate this.  Certainly this is a  limb threatening problem given that she has multilevel arterial  occlusive disease and the wounds in the left leg including a small wound  on the posterior aspect of her left leg.  However, currently she still  does not wish to proceed with arteriography.  I recommend that they  continue with conservative treatment using only mild compression with  her dressing changes because of her arterial insufficiency.  If the  wounds progress, she would have to decide between considering  arteriography with a small chance she would have an option for  revascularization versus proceeding directly to a primary amputation.  I  will see her back in 6 months.  She knows to call sooner if she has  problems.     Di Kindle. Edilia Bo, M.D.  Electronically Signed   CSD/MEDQ  D:  02/01/2010  T:  02/02/2010  Job:  [id number]   cc:   Lenon Curt. Chilton Si, M.D.

## 2010-09-12 NOTE — Discharge Summary (Signed)
NAMEJERITA, WIMBUSH NO.:  [id number]   MEDICAL RECORD NO.:  [id number]          PATIENT TYPE:  INP   LOCATION:  6705                         FACILITY:  [facility]   PHYSICIAN:  Madaline Savage, MD        DATE OF BIRTH:  30-Oct-1945   DATE OF ADMISSION:  04/18/2007  DATE OF DISCHARGE:  04/21/2007                               DISCHARGE SUMMARY   PRIMARY CARE PHYSICIAN:  Maxwell Caul, M.D.   FINAL DIAGNOSES:  1. Acute gastritis which has resolved.  2. Acute renal insufficiency.  3. Acute bronchitis.  4. Diabetes mellitus.  5. Peripheral vascular disease.  6. Dyslipidemia.  7. Mild elevation of CK-MB.  8. Seizure disorder.   DISCHARGE MEDICATIONS:  1. Avelox 400 mg once daily for 5 more days.  2. Ambien CR 12.5 mg nightly.  3. Vicodin 5/500 one nightly as needed.  4. Lantus 40 units subcutaneously q.a.m. and 45 units subcutaneously      q.p.m.  5. ProPass 2 scoops up to twice daily.  6. Neurontin 200 mg 3 times daily.  7. Detrol LA 4 mg daily.  8. Potassium chloride 10 mEq daily.  9. Zocor 80 mg daily.  10.MiraLax 17 g in 8 ounces of water daily.  11.Colace 10 mg by mouth daily.  12.Multivitamin tablet 1 daily.  13.Vitamin C 500 mg twice daily.  14.Mucinex 600 mg twice daily.  15.TriCor 145 mg daily.  16.Omeprazole 20 mg daily.  17.Lasix 20 mg daily.  18.Questran 2 mg p.o. twice daily.  19.Lisinopril 40 mg twice daily.  20.Norvasc 10 mg daily.  21.Labetalol 200 mg twice daily.  22.Clonidine 0.1 mg twice daily.   PROCEDURES DONE IN THE HOSPITAL:  She had an x-ray chest done on  April 18, 2007 which showed low lung volumes with possible  bronchitis, no edema or focal air space disease.   HISTORY OF PRESENT ILLNESS:  For full history and physical, see history  and physical dictated by Dr. Ricke Hey on April 18, 2007. In short,  Ms. Kemler is a 65 year old lady who was transferred to the nursing  facility with nausea and vomiting for 2 to 3 days  and chronic wound to  the left shin. She was found to have an elevated creatinine on  admission. She was admitted for hydration and further evaluation and  management.   PROBLEM LIST:  1. Acute gastritis. Ms. Quimby has nausea and vomiting which has      since resolved. She rechecked her lipase which was negative. She      was treated for IV fluids and Phenergan and Zofran. She is now able      to tolerate regular diet without any difficulty.  2. Acute renal failure. On admission, her creatinine was 4.2 which      fell back to her baseline of 1.3 with hydration. We have      reinitiated her ACE inhibitor, and her creatinine has been stable      since then. We will restart her Lasix at the time of discharge.  3. Acute bronchitis. Ms. Villasenor has been coughing,  and she had x-ray      chest which showed possible bronchitis. She has been on Avelox as      an outpatient. We have continued it, and we will continue it for      five more days after discharge.  4. Left leg ulcer. She is being treated with Avelox as an outpatient.      We will complete the course of Avelox.  5. Diabetes mellitus type 2. She was controlled while she was in the      hospital. We will put her on the same regimen as outpatient.   DISPOSITION:  She is now being discharged back to skilled nursing  facility in stable condition.   FOLLOW UP:  She will be followed up at the doctors at the nursing  facility.      Madaline Savage, MD  Electronically Signed     PKN/MEDQ  D:  04/21/2007  T:  04/21/2007  Job:  [id number]

## 2010-09-12 NOTE — Discharge Summary (Signed)
NAME:  Allison Washington, Allison Washington            ACCOUNT NO.:  [id number]   MEDICAL RECORD NO.:  [id number]          PATIENT TYPE:  INP   LOCATION:  1337                         FACILITY:  [facility]   PHYSICIAN:  Hind I Elsaid, MD      DATE OF BIRTH:  Dec 03, 1945   DATE OF ADMISSION:  11/11/2007  DATE OF DISCHARGE:  11/20/2007                               DISCHARGE SUMMARY   DISCHARGE DIAGNOSES:  1. Hypoglycemia, resolved.  2. Diabetes mellitus.  3. Acute renal failure, resolved.  4. Dehydration.  5. Anemia.  6. Methicillin-resistant Staphylococcus aureus decubitus ulcer.  7. Decubitus ulcer.  8. History of deep vein thrombosis.  9. History of cerebrovascular accident with expressive aphasia.  10.Hypertension.  11.Coronary artery disease.  12.Hyperlipidemia.  13.History of diverticulosis.   DISCHARGE MEDICATIONS:  1. Norvasc 10 mg daily.  2. Ascorbic acid 500 mg p.o. b.i.d.  3. Cipro 500 mg p.o. every 12 hours for 2 weeks.  4. Clonidine 0.1 mg p.o. 3 times daily.  5. Docusate 100 mg p.o. daily.  6. Duragesic patch 25 mcg every 72 hours.  7. Neurontin 400 mg 3 times daily.  8. Insulin Lantus 30 units subcu at bedtime.  9. Insulin sliding scale with coverage t.i.d., a.c., and nightly.  10.Labetalol 200 mg twice daily.  11.Synthroid 12.5 mcg daily.  12.Lisinopril 20 mg daily.  13.Multivitamin 1 tablet daily.  14.Protonix 40 mg daily.  15.Zocor 40 mg p.o. nightly.  16.Vicodin 500/5 one tablet every 4 to 6 hours as needed.  17.MiraLax 17 mg grams daily for constipation.  18.Detrol 4 mg daily.  19.Doxicycline 100 mg twice daily for 3 weeks.  20.Lactobacillus acephalies 1 tablet twice daily for 3 weeks.   CONSULTATIONS:  Wound Care consulted with recommendation  aquacell_ to  absorb drainage, provide antimicrobial benefit and promote healing.   HOSPITAL COURSE COVERING THIS PERIOD:  This is add-on to the discharge  summary done by Dr. Christella Noa.  1. Wound cultrue grew methicillin resistant  staph aureus, which is      sensitive to Bactrim, doxicycline and gentamicin.  According,      patient will be discharged on doxicycline and Cipro for 2 to 3      weeks with wound care as documented per wound care consultation.      Patient has no evidence of fever or white blood cells.  2. Acute renal failure, which completely resolved.  Creatinine back to      normal.  We have decreased the dose of lisinopril to 20 mg p.o.      daily.  We hold Lasix and potassium supplement.  We leave the      decision to the nursing home physician to resume her Lasix or      increasing her ACE inhibitor.  3. Hypertension.  Blood pressure remains reasonably controlled with      Norvasc and we increased clonidine to 0.1 mg p.o. 3 times daily.  4. Diabetes mellitus with episode of hypoglycemia.  Patient has no      further hypoglycemia and fingersticks remain stable.   DISPOSITION:  It was felt that the  patient is medically stable to be  discharged to nursing home.      Hind Bosie Helper, MD  Electronically Signed     HIE/MEDQ  D:  11/20/2007  T:  11/20/2007  Job:  [id number]

## 2010-09-12 NOTE — H&P (Signed)
NAME:  Allison Washington, KRONE NO.:  [id number]   MEDICAL RECORD NO.:  [id number]          PATIENT TYPE:  INP   LOCATION:  6705                         FACILITY:  [facility]   PHYSICIAN:  Isidor Holts, M.D.  DATE OF BIRTH:  [birth year]   DATE OF ADMISSION:  04/18/2007  DATE OF DISCHARGE:                              HISTORY & PHYSICAL   PRIMARY CARE PHYSICIAN:  Maxwell Caul, M.D., [facility].   CHIEF COMPLAINT:  Vomiting for 2-3 days, increasing lethargy, chronic  wound to left shin.   HISTORY OF PRESENT ILLNESS:  This is a 65 year old female resident of  [facility] nursing facility.  For Past Medical History, see  below.  The patient was referred to the emergency department from  nursing facility for above symptoms and also worsening BUN and  creatinine.  Reportedly,  her labs were done on April 17, 2007, and  showed elevated BUN and creatinine.  She denies fever, denies cough,  chest pain, abdominal pain or diarrhea.  Her last bowel movement was on  April 18, 2007, and was normal.  In addition, the patient has a  history of left lower extremity venous stasis ulcer and has been under  the care of the wound care clinic; i.e., Dr. Janyth Pupa and Dr. Cheryll Cockayne.  Reportedly, this wound had healed in November 2008.  However, she had  scraped her left anterior shin fairly recently and, since then, has  developed another ulcer.  Her last wound care visit was on April 16, 2007, when she was seen by Dr. Cheryll Cockayne.   PAST MEDICAL HISTORY:  1. Cerebrovascular disease status post multiple CVAs, now has      dysarthria.  2. Dysphagia secondary to #1 above.  The patient, however, is now on a      regular diet.  3. Type 2 diabetes mellitus, now insulin requiring  4. Dyslipidemia.  5. Hypertension.  6. Previous history of sacral decubitus, now healed.  7. Peripheral vascular disease status post right above-knee      amputation.  8. 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  9. Chronic renal insufficiency (creatinine was 0.9 in June 2007).  10.Anemia of chronic disease.  11.Recurrent diverticular bleed.  12.Status post cholecystectomy.  13.DNR status.   MEDICATION HISTORY.:  1. Vicodin (5/500) one p.o. nightly.  2. Ambien CR 12.5 mg p.o. nightly.  3. Lantus 40 units subcutaneously q.a.m. and 45 units subcutaneously      q.p.m.  4. Propass two scoops p.o. b.i.d.  5. Neurontin 100 mg p.o. 3 times a day.  6. Detrol LA 4 mg p.o. daily.  7. KCl 10 mEq p.o. daily.  8. Zocor 80 mg p.o. nightly.  9. MiraLax 17 grams p.o. daily.  10.Colace 100 mg p.o. daily.  11.Multivitamin one p.o. daily.  12.Vitamin C 500 mg p.o. b.i.d.  13.Mucinex 600 mg p.o. b.i.d.  14.Tricor 145 mg p.o. daily.  15.Omeprazole 20 mg p.o. daily.  16.Lasix 20 mg p.o. daily.  17.Questran 2 grams p.o. b.i.d.  18.Lisinopril 40 mg p.o. daily.  19.Norvasc 10 mg p.o. daily.  20.Labetalol 200 mg p.o. b.i.d.  21.Clonidine 0.1 mg p.o. b.i.d.  22.Avelox 400 mg p.o. daily, now on day #11 of 14 (Was on Augmentin      until April 07, 2007).   ALLERGIES:  No known drug allergies.  However, appears to have  intolerance to LIPITOR.   REVIEW OF SYSTEMS:  As per HPI and Chief Complaint, otherwise negative.   SOCIAL HISTORY:  The patient is a resident of [facility]  nursing facility.  She is an ex-smoker, used to smoke 1 packet of  cigarettes per day for the past 30 years; however, quit about 5 years  ago. Nondrinker. Has no history of drug abuse.   FAMILY HISTORY:  Both parents are deceased.  Mother died of colon cancer  at the age 70 years. Father died at age 40 years status post CVA.  He  had diabetes mellitus.  Family history is otherwise noncontributory.   PHYSICAL EXAMINATION:  VITAL SIGNS:  Temperature 98.8, pulse 83 per  minute and regular, respiratory rate 16, blood pressure 102/57 mmHg,  pulse oximeter 94% on  2 liters of oxygen.  GENERAL:  The patient does not appear to be in obvious acute distress,  alert, communicative, not short of breath at rest.  HEENT:  Mild clinical pallor, no jaundice.  No conjunctival injection.  Throat: Visible mucous membranes appear dry.  NECK:  Supple.  JVP not seen.  No palpable lymphadenopathy.  No palpable  goiter.  No carotid bruits.  CHEST:  Clinically clear to auscultation.  No wheezes or crackles.  CARDIAC:  Heart sounds 1 and 2 heard, normal, regular, no murmurs.  ABDOMEN:  Obese.  Cholecystectomy scar is seen in the right upper  quadrant. Abdomen is otherwise soft, nontender.  No palpable masses, no  palpable organomegaly.  Bowel sounds are heard.  EXTREMITIES:  Lower extremity examination:  Right above-knee amputation  stump appears unremarkable.  Left lower extremity:  No pitting edema.  The patient does have a 2 x 2 cm rather shallow ulcer anterior shin and  a small even more superficial lesion just below that.  There is focal  redness; however, it is doubtful that cellulitis is present.  Peripheral  pulses are palpable.  MUSCULOSKELETAL:  System unremarkable.  CENTRAL NERVOUS SYSTEM:  The patient has dysarthria, otherwise no focal  neurologic deficit is elicitable.   INVESTIGATIONS:  CBC:  WBC 7.3, hemoglobin 10.9, hematocrit 33.2,  platelets 394.  Electrolytes:  Sodium 134, potassium 5.5, chloride 104,  CO2 22, BUN 60, creatinine 4.21, glucose 240.  AST 21, ALT 18.  Urinalysis shows wbc's 0-2, rbc's 0-2, bacteria rare.   Chest x-ray dated April 18, 2007, shows central airway thickening  without infiltrate, query bronchitis.  No edema.   EKG dated April 18, 2007, shows sinus rhythm, regular, 83 per minute,  normal axis, old Q waves in V1-V2.  No acute ischemic changes.   ASSESSMENT AND PLAN:  1. Vomiting, query etiology.  Likely secondary to gastritis. However,      gastroparesis has to be considered in this diabetic.  We shall       commence the patient on clear fluids, commence intravenous fluid      hydration, utilize antiemetics and proton pump inhibitor.  Also      place the patient on Reglan.  For completeness, will do lipase      level, although the patient has no abdominal pain.   1. Dehydration and acute  renal failure.  This is secondary to #1      above.  Creatinine was 0.9 in June 2007.  Her recent renal indices,      however, are not known.  We shall manage intravenous fluids, of      course hold ACE inhibitor, diuretics and potassium chloride.  We      anticipate improvement in renal function, as this appears to be      prerenal; however, if resolution is slow, we shall investigate      further with renal ultrasound and may consider nephrology      consultation.   1. Left leg ulcer.  This is a venous stasis ulcer.  The patient is      currently under the management of Dr. Cheryll Cockayne at the wound care      clinic.  We shall complete course of Avelox. The patient is now      currently on day #11 of 14, although wound appears to be improving      significantly, and we shall also involve the wound management care      team.   1. Type 2 diabetes mellitus.  This is uncontrolled.  We shall manage      with carbohydrate-modified diet, sliding scale insulin coverage and      Lantus insulin.   1. Blood pressure.  This is controlled.  The patient is on multiple      hypertensive medications.  We shall hold her ACE inhibitor and      Clonidine for now, and continue Norvasc and labetalol.  Further      adjustments will be made depending on the patient's blood pressure.   Further management will depend on clinical course.   Note:  The patient presents with out office with a documentation  indicating that she is DNR.  We shall maintain this status during the  course of her hospitalization.      Isidor Holts, M.D.  Electronically Signed     CO/MEDQ  D:  04/18/2007  T:  04/20/2007  Job:  [id number]   cc:    Maxwell Caul, M.D.

## 2010-09-12 NOTE — Assessment & Plan Note (Signed)
[facility]   NAME:  Allison Washington, Allison Washington            ACCOUNT NO.:  [id number]   MEDICAL RECORD NO.:  [id number]      DATE OF BIRTH:  09/08/1945   PHYSICIAN:  Jake Shark A. Tanda Rockers, M.D. VISIT DATE:  04/30/2007                                   OFFICE VISIT   SUBJECTIVE:  Allison Washington is a 65 year old diabetic with  nonreconstructable vascular disease, who we have followed for combined  arterial and stasis ulceration of the left lower extremity.  In the  interim she has been treated with a bulky dressing, antiseptic soap  washes and care to avoid recurrent trauma.  The patient denies pain.  She is nonambulatory.   OBJECTIVE:  Blood pressure is 183/83, respirations 18, pulse rate of 96,  temperature 98.6.  Inspection of the left lower extremity shows two separate ulcerations,  1A and 2, on the left lower leg and the left posterior leg,  respectively.  Both wounds have a moist scab.  There is associated 2+  edema.  The pedal pulses are indeterminate.  The capillary refill is  sluggish and consistent with the known level of vascular insufficiency.  There is no pain.  However, the patient remains insensate   ASSESSMENT:  Stable combined ulcerations of stasis and arterial  insufficiency.   PLAN:  We will place the patient on palliative status.  We will continue  to apply topical triamcinolone cream with a bulky dressing to avoid  recurrent trauma.  We will reevaluate the patient on a monthly basis or  p.r.n.      Harold A. Tanda Rockers, M.D.  Electronically Signed     HAN/MEDQ  D:  04/30/2007  T:  04/30/2007  Job:  [id number]   cc:   Baltazar Najjar, MD

## 2010-09-15 NOTE — Discharge Summary (Signed)
[place]  Patient:    Allison Washington, SUNDSTROM Visit Number: [id number] MRN: [id number]          Service Type: [facility] Location: [id number] Attending Physician:  Herold Harms Dictated by:   Mcarthur Rossetti. Angiulli, P.A. Admit Date:  08/11/2001 Disc. Date: 09/02/01   CC:         Deanna Artis. Sharene Skeans, M.D.  [facility], [facility]   Discharge Summary  DISCHARGE DIAGNOSES:  1. 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  2. Dysphagia.  3. Insulin dependent diabetes mellitus with poor compliance.  4. Hypertension.  5. History of transient ischemic attack x2.  6. Coronary artery disease with a PTCA x2.  7. Depression.  8. Tobacco abuse.  HISTORY OF PRESENT ILLNESS:  This is a 65 year old right handed black female with poor compliance of her diabetes mellitus and hypertension who has had transient ischemic attacks in January of 1999 and September of 2002 who was admitted July 31, 2001 with inability to speak and right sided weakness. On evaluation, cranial CT scan with chronic scattered lacunar infarctions, carotid duplex negative. Echocardiogram with ejection fraction 40-50% without thrombus. MRI with acute infarct of the left basal ganglia, posterior limb of the internal capsule and left thalamus advanced small vessel disease. Neurology services, Dr. Sharene Skeans, placed her on aspirin and subcutaneous Lovenox. ______ barium swallow April 5, the patient made n.p.o. with panda tube feeds. Follow-up fees April 9 n.p.o. Later placed on dysphagia 1 nectar thick liquid diet with intravenous fluid for hydration. Gastroenterology consultant Dr. Randa Evens on April 10 for evaluation of gastrostomy tube of which she refused. Max assistance for supine to sit, moderate assistance sitting balance. Speech therapy follow-up for her swallowing difficulties. Latest chemistries unremarkable. She did have a hemoglobin A1C of 12.6,  hemoglobin of 12.9. She was admitted for comprehensive rehab program.  PAST MEDICAL HISTORY:  1. Insulin dependent diabetes mellitus.  2. Hypertension.  3. Transient ischemic attack x2.  4. Coronary artery disease with PTCA x2.  5. Depression.  6. She does have a history of tobacco abuse, denies alcohol.  ALLERGIES:  Penicillin.  PAST SURGICAL HISTORY:  Cholecystectomy.  MEDICATIONS PRIOR TO ADMISSION:  Insulin 45 units twice daily, questionable how compliant she was with this medication.  SOCIAL HISTORY:  Widowed, retired Engineer, civil (consulting). Independent prior to admission. Lives at [facility]. Local son and daughter.  HOSPITAL COURSE:  The patient was slow to progressive gains while in rehab services with therapies initiated on b.i.d. basis. The following issues are followed during the patients rehab course. Pertaining to Ms. Gorsline left cerebrovascular accident, right sided weakness grossly graded 3+/5 ongoing aphasia followed by speech therapy. She was able to communicate her needs by writing and was supplied with communications board. She continued on aspirin therapy as well as subcutaneous Lovenox for deep venous thrombosis prophylaxis. Speech therapy input for her dysphagia. She had graduated from a panda feeding tube to a dysphagia 1 nectar thick liquid diet at the time of admission ______ since April 14. The family remained quite adamant on advancing of her diet. All issues were discussed on possibilities of aspirin pneumonia and even death; nonetheless, it still continued to be their request thus diet was advanced to a mechanical soft still accepting of nectar thick liquids. Ongoing chest congestion. She did receive a chest x-ray on April 28 showing no acute abnormalities. She remained afebrile and monitored. Her blood sugars had some variables early on in her hospital course,  continued to improve with adjustments in medication. She remained on her Actos 30 mg daily which had  been increased on August 18, 2001 as well as NPH insulin 15 units every a.m. and Lantus insulin 22 units every bedtime. It was again discussed at length the need to be complaint with these medications for her diabetes mellitus. Blood sugars controlled with multiple medications including hydrochlorothiazide, Altace and Lopressor. There were no orthostatic changes. She had a history of depression and she continued on the Remeron 50 mg at bedtime. This was monitored per neuropsychologist, Dr. Eula Flax. Again ongoing history of tobacco abuse. She was weaned from a nicoderm patch. She continued on Protonix for gastroesophageal reflux disease and Zocor for her hyperlipidemia. Overall for her functional mobility, she was minimum to moderate assists for bed mobility and transfers ambulating 60 feet. Minimal assists for upper body dressing and moderate assist lower body dressing. Again it was questionable if the family could provide the necessary supervision at home. There was a question of skilled nursing facility needed and this was discussed with the family.  The latest labs showed a sodium of 141, potassium 3.3, BUN 9, creatinine 0.7. glucose of 154, hemoglobin 12.4, hematocrit 36.2.  DISCHARGE MEDICATIONS:  1. Remeron 50 mg at bedtime.  2. Ecotrin 325 mg daily.  3. Hydrochlorothiazide 25 mg daily.  4. Protonix 40 mg daily.  5. Zocor 20 mg daily.  6. Altace 10 mg daily.  7. Actos 30 mg daily.  8. Nicoderm patch 14 mg tapered accordingly.  9. Lopressor 50 mg twice daily. 10. Insulin Humulin NPH 15 units daily and Lantus insulin 22 units at     bedtime. 11. Tylenol as needed.  DISCHARGE ACTIVITY:  As tolerated.  DISCHARGE DIET:  Mechanical soft nectar thick liquid diet.  SPECIAL INSTRUCTIONS:  Continue to check blood sugars twice daily. Follow-up with primary M.D. which is at the [facility]. She will  continued to be followed in follow-up by Dr. Ellwood Dense at the [facility]. Dictated by:   Mcarthur Rossetti. Angiulli, P.A. Attending Physician:  Herold Harms DD:  09/01/01 TD:  09/01/01 Job: [id number]

## 2010-09-15 NOTE — Discharge Summary (Signed)
[facility]  Patient:    Allison Washington, Allison Washington Visit Number: [id number] MRN: [id number]          Service Type: MED Location: [id number] Attending Physician:  Phifer, Trinna Post Dictated by:   Kerrie Pleasure, M.D. Admit Date:  10/25/2001 Discharge Date: 10/26/2001   CC:         Marinda Elk, M.D.   Discharge Summary  DISCHARGE DIAGNOSES: 1. Lower extremity swelling, possible cellulitis. 2. Type 2 diabetes mellitus. 3. History of multiple cerebrovascular accidents. 4. Anemia. 5. Hypertension.  DISCHARGE MEDICATIONS:  1. Aspirin 325 mg p.o. q.d.  2. Hydrochlorothiazide 25 mg p.o. q.d.  3. Zocor 20 mg p.o. q.d.  4. Altace 10 mg p.o. q.d.  5. Remeron 50 mg p.o. q.h.s.  6. Actos 30 mg p.o. q.d.  7. Metoprolol 50 mg b.i.d.  8. Lantus 22 units q.p.m.  9. NPH insulin 50 units q.a.m. 10. Augmentin 75 mg p.o. b.i.d. 11. Vicodin 1 to 2 tablets q.4-6h. p.r.n. 12. Laxative of choice.  CONSULTATIONS:  Candy Sledge, M.D., neurology.  PROCEDURES PERFORMED: 1. Head CT scan with contrast, normal evolutionary changes of previous left    subcortical branches. Negative for acute disease. 2. Lower extremity Dopplers performed on October 25, 2001, was negative for DVT. 3. Arterial Dopplers performed showed ABI of 0.21 on the right and 0.44 on the    left. 4. X-ray of the right leg tibia and fibula complete as well as ankle complete,    thus was negative for fractures or bony involvement.  FOLLOWUP:  The patient is to follow up with Dr. Marinda Elk of [facility] who is apparently going to be her new primary care physician. During that follow up we recommend the following:  1. Her diabetes. It looks like the patient is on both Lantus and NPH insulin, and that has been her home dose according to old records from hospitalization. This does not seem to be the right thing from our perspective. We think that the patient should have at least  three doses of probably Regular Insulin around mealtime in addition to the Lantus. We did not make these changes because the patient was in the hospital for less than 24 hours, not adequate time for [place] to start some new medications and make adjustments. We suggest that her primary care physician, Dr. Foy Guadalajara, will deal with her insulin dosing.  2. The patient is to start on Augmentin orally, and when she sees Dr. Foy Guadalajara later this week, he will reevaluate her lower extremity swelling or cellulitis and come to a decision as to what is going on.  3. Anemia. The patient seemed to have some hemoglobin that has dropped from her previous hemoglobin levels. With orders for baseline studies done during this admission, including iron, ferritin, TVAC, which are not back at this point. These records will be forwarded to Dr. Foy Guadalajara once they are available.  4. History of recurrent CVAs. This is worrisome as the patient has had at least four documented CVAs. We recommend that she may be a good candidate for adding Plavix. However, since she has not had any GI workup and she does have some anemia, it would be good after a possible GI workup to add something like Plavix.  HISTORY OF PRESENT ILLNESS:  The patient is a 65 year old African-American female with a history of multiple CVAs x 4, diabetes mellitus type 2 which is poorly controlled. Residual effects from her CVA include a right  hemiparesis, dysarthria, right facial weakness and a history of TIAs back in November as well as September 2002, history of hypertension, history of coronary artery disease, status post PTCA x 2, history of depression, past tobacco abuse, she quit in April 2003, history of dysphagia, status post cholecystectomy and hyperlipidemia with a previous cholesterol of 215, HDL 37, LDL 147 on July 29, 2001. She was brought in by her daughter on October 25, 2001, secondary to right leg swelling for about a week, change in speech for  about three weeks, right-sided face puffiness for about a week, and dragging of her right leg for about a week. The patient was admitted here from July 29, 2001, to Sep 02, 2001, for right hemiparesis and dysarthria secondary to acute left basal ganglia, thalamic and PLIC infarct as per her MRI. Her MRA shows intracranial atherosclerosis with focal stenosis. The patient of note had dysphagia and was on rehabilitation prior to her discharge. She has apparently been doing well since discharge prior to this problem. She had no trauma, no fever or falls.  PHYSICAL EXAMINATION:  VITAL SIGNS:  On examination she had a temperature of 98.5, blood pressure 160/63, pulse 71, respirations 20, oxygen saturation 98% on room air.  GENERAL:  She was generally stable, in no acute distress. She was dysarthric and lying in bed.  HEENT:  Examination shows mild facial asymmetry. PERRL. EOMI.  NECK:  Supple, no JVD, no lymphadenopathy.  CHEST:  Clear to auscultation and symmetric.  CARDIOVASCULAR:  Regular rate and rhythm, no murmurs.  ABDOMEN:  Soft, nontender with positive bowel sounds.  EXTREMITIES:  Her right leg was swollen between her ankle and calf. It was cold, not cyanotic and had a positive dorsalis pedis pulse. She had a scab at the anterior shin but her left leg was OK.  NEUROLOGIC: She was alert and oriented x 1. She was dysarthric with right facial weakness which was mild, increased tone in the right upper and lower extremities. Mild right arm droop. Power was estimated at 4/5 on the right upper and lower extremities and 5/5 on the left upper and lower extremities. Her reflexes were 3+. She had intact sensation. No cerebellar defects. Gait was not measured.  LABORATORY DATA ON ADMISSION: White count 5.6, hemoglobin 11.1 which was down from 12.9 back in April, platelets 273, MCV 85.2. Sodium 141, potassium 3.6, chloride 104, bicarbonate 30, BUN 19, creatinine 0.1, glucose 124. LFTs  showed  alkaline phosphatase 75, AST 50, ALT 11, total protein 7, albumin 3, total bilirubin 0.3.  Her head CT scan, x-rays and Dopplers are as reported above.  HOSPITAL COURSE:  #1- RIGHT LOWER EXTREMITY SWELLING:  Negative tests for DVT, negative x-rays for bony involvement, and finally the patient has a scab on the right anterior shin. The most likely cause of her lower extremity swelling was thought to be probably cellulitis in a diabetic. We therefore admitted the patient for observation, gave her intravenous Unasyn which was later changed to Augmentin at the time of the discharge.  Vascular surgery was consulted due to her terrible ABI, but the patient has had a fem-pop in the past, so she is not thought to have peripheral vascular disease. No intervention is thought necessary at this time. We will therefore discharge her to home on oral antibiotics for at least 14 days, and she is to follow up with Dr. Foy Guadalajara, who is going to be her primary care doctor.  #2 - TYPE 2 DIABETES MELLITUS:  The patient  has a weird insulin regimen of Lantus as well as NPH. We recommended that the patient be switched to a better schedule of Lantus and maybe Regular Insulin at mealtimes. We did not do that because the patient is in the hospital for less than 24 hours, too short a time for [place] to make adjustment, and we did not want to make some changes immediately since her CBGs seem to be well controlled on the current regimen. We, however, have sent out laboratory work for hemoglobin A1C, which is pending at the time of discharge.  #3 - HISTORY OF CEREBROVASCULAR ACCIDENT:  There is no evidence of recurrence per neurology, as well as according to the MRI, so we kept the patient on her home medications.  #4 - ANEMIA:  The patient has had a previous hemoglobin of 11.1, but she was discharged with 12.9. We are therefore going to try to do a guaiac, doing some iron studies, B12 and folate. This was  also still pending at the time of this discharge.  #5 - HYPERTENSION:  The patient is came in on hydrochlorothiazide, Altace and Lopressor. Her blood pressure was slightly elevated on admission at 160/63, however, it was not sure whether she has been taking all those medications. We therefore made no changes to her medications. She will need to be seen as an outpatient.  #6 - HYPERLIPIDEMIA:  The patient has a history of hyperlipidemia with the numbers given above. She is currently on Zocor 20, which may need to be increased. The dose may have to be increased, especially now that her LFTs seem to be normal. We will, however, leave that to her primary care physician to do that at the time of his visit.  DISCHARGE LABORATORY DATA:  On the day of discharge the patients labs were as follows:  White count 5.4, hemoglobin 10.6, platelets 252. Sodium 142, potassium 3.5, chloride 107, CO2 30, BUN 15, creatinine 0.8, glucose 77, calcium 9.3. Dictated by:   Kerrie Pleasure, M.D. Attending Physician:  Phifer, Trinna Post DD:  10/26/01 TD:  10/27/01 Job: [id number]

## 2010-09-15 NOTE — Consult Note (Signed)
NAME:  Allison Washington, Allison Washington NO.:  [id number]   MEDICAL RECORD NO.:  [id number]          PATIENT TYPE:  INP   LOCATION:  3032                         FACILITY:  [facility]   PHYSICIAN:  Casimiro Needle L. Reynolds, M.D.DATE OF BIRTH:  09-Sep-1945   DATE OF CONSULTATION:  10/04/2005  DATE OF DISCHARGE:                                   CONSULTATION   REQUESTING PHYSICIAN:  Isidor Holts, M.D.   REASON FOR CONSULTATION:  Stroke and GI bleeding.   HISTORY OF PRESENT ILLNESS:  This is an inpatient consultation of this  existing [facility] patient. A 65 year old woman with  a past medical history which includes multiple previous subcortical strokes  with resultant dysarthria and dysphasia as well as an admission February of  this year for status epilepticus thought due to previous strokes and poorly  controlled blood sugars. She was seen for this in February by Dr. Anne Hahn and  was seen in May by Dr. Orlin Hilding in the hospital for right-sided numbness and  dysarthria thought to represent a stroke event. At that time, she had an MRI  which did not demonstrate an acute stroke and she was discharged to a  nursing home on Aggrenox which was a change from her aspirin and Plavix as  well as on low dose Lovenox for DVT prophylaxis. The patient has numerous  other medical problems as outlined below including anemia although I cannot  see that she has a history of GI bleeding. The patient was admitted to the  hospitalist service on October 03, 2005 after presenting to the emergency room  with an alteration of level of consciousness which was described as  catatonic as well as being unable to communicate. She was noted to have  hematochezia in the emergency room. She was admitted and is continued on  Aggrenox but her Lovenox has been held. She was found in the emergency room  to have a hemoglobin of 6.6 which is down over 4 grams from her discharge  hemoglobin just 2 weeks prior.  She was also found to have a urinary tract  infection and has been on intravenous ciprofloxacin. GI was consulted. She  underwent EGD and colonoscopy yesterday with the former demonstrating  gastritis and latter demonstrating only mild diverticulosis with no obvious  source of bleeding noted. She also has had an MRI of the brain this  admission which is reviewed and was read out as raising the possibility of  embolic cardiovascular events. Neurological consultation is subsequently  requested for further recommendation especially regarding anticoagulation or  treatment with antiplatelet therapy. The patient at this time is awake and  alert. She is quite dysarthric but is able to communicate by writing. She  reports that she is in pain mostly related to her right AK stump.   PAST MEDICAL HISTORY:  Remarkable for multiple previous strokes as above.  She was admitted to the stroke service in the past, most recently 2004 under  the care of Dr. Sharene Skeans. She has resultant chronic dysarthria and dysphasia  as a result of this. She has refused peg tube placement in the past. She  has  a history of known hypertension, diabetes and hyperlipidemia all of which  has been under poor control. She spent the last couple of months in a  nursing home and control has been better. She was admitted to the hospital  on June 14, 2005 in status epilepticus associated with nonketotic  hyperosmolar state. She has not been on chronic anticonvulsants. 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   Family/social/review of systems is outlined in the admission H&P by Dr.  Mikeal Hawthorne which is reviewed.   MEDICATIONS:  Prior to admission, she was taking oxycodone 10 mg q.8 h.,  Norvasc, Detrol, Protonix, Catapres patch, Zocor, NovoLog sliding scale,  Aggrenox b.i.d., Lantus insulin, lisinopril, labetalol and Lovenox 40 mg  subcu daily.   Her present medication list includes Norvasc, Aggrenox, ciprofloxacin,   Catapres patch, Duragesic patch 25 mg q.72 h, Lantus and NovoLog insulin,  labetalol, lisinopril, Protonix, Simvastatin and Detrol.   PHYSICAL EXAMINATION:  VITAL SIGNS:  Temperature 99.0, blood pressure  152/57, pulse 75, respirations 18. CBG 101, O2 sat 97% on room air.  GENERAL:  This is a chronically ill-appearing woman supine in the hospital  bed in no evident distress.  HEENT:  Head, cranium is normocephalic, atraumatic. Oropharynx is benign.  NECK:  Supple with a right carotid bruit.  HEART:  Regular rate and rhythm without murmurs.  NEUROLOGIC:  Mental status, she is awake and alert. She is able to follow  commands, she can communicate by writing. Her speech is extremely dysarthric  and quite difficult to understand. Again she can communicate well and follow  commands without much difficulty.   Cranial nerves, funduscopic exam reveals proliferative changes. Pupils are  equal and reactive. Extraocular movements are limited and upgaze. Visual  fields full to confrontation. She has glabellar and strong snout reflexes.  Face is weak bilaterally but much more so on the right. Her palate elevates  poorly. She is able to protrude her tongue.   Motor normal bulk and tone. The right leg is amputated above the knee.  Normal strength in all tested extremity muscles.   Sensation: intact to light touch and pinprick sensation over the left foot  and hyperpathia to touch over the stump on the right.   Coordination, finger-to-nose performed adequately. Gait is deferred.  Reflexes are brisk throughout. Toe is upgoing on the left and unobtainable  on the right.   LABORATORY DATA:  CBC from this morning, white count 9.6, hemoglobin 10.6,  platelets 305,000. Her hemoglobin is down from 12.2 on June 9 though much  improved from 6.6 at admission and in line with the 10.7 that she had on Sep 18, 2005 at her recent discharge. BMET from this morning, potassium a little  low at 3.2, glucose slightly  low at 68 otherwise unremarkable. Initial urine  culture consistent with UTI with Klebsiella pneumoniae. Urinalysis taken  this morning clear. I personally reviewed the MRI of the brain performed on  October 07, 2005. The study is remarkable for several subcortical strokes  particularly in the corpus callosum and to a lesser extent in the white  matter of both hemispheres. These keystrokes are in a very unusual  distribution and the question of embolic phenomenon is raised although I  have a hard timing explaining why the lesions are not more peripheral and  more cortical. This is all against the background of fairly severe  subcortical white matter disease and old strokes in the pons bilaterally as  well  as in the thalami.   IMPRESSION:  1.  Multiple subcortical stroke with resultant worsening of baseline      dysarthria, dysphasia and transient mental status change in a patient      with multiple previous subcortical strokes and known vascular risk      factors including hypertension, hyperlipidemia and diabetes.  2.  There are two recent GI bleeds with hemoglobin drop of over 4 grams over      2 weeks while the patient was on aspirin and DVT prophylaxis doses of      Lovenox.  3.  Right leg phantom limb pain.   RECOMMENDATIONS:  In regards to anticoagulation and antiplatelet therapy, I  would continue Aggrenox and discontinue and hold the Lovenox, this has been  done in the hospital. She is unlikely to bleed significantly on this regimen  but she needs to have CBCs watched very closely throughout the rest of her  hospital stay and also at discharge. While the MRI does raise the question  of embolic disease, she is obviously an extremely poor Coumadin candidate at  this time. If at some point, she might become a Coumadin candidate in the  future, it would be reasonable to proceed with a transesophageal  echocardiogram and possibly a TCD bubble study as well. Again that would  depend a  lot on whether or not she would ever be a Coumadin candidate in the  future which would depend on GI issues such as whether her gastritis could  be controlled. She is undergoing risk factor control, consider gabapentin  for neuropathic pain.   Thank you for this consultation.      Michael L. Thad Ranger, M.D.  Electronically Signed     MLR/MEDQ  D:  10/09/2005  T:  10/09/2005  Job:  [id number]

## 2010-09-15 NOTE — H&P (Signed)
NAME:  Allison, Washington                        ACCOUNT NO.:  [id number]   MEDICAL RECORD NO.:  [id number]                   PATIENT TYPE:  EMS   LOCATION:  MAJO                                 FACILITY:  [facility]   PHYSICIAN:  Corinna L. Lendell Caprice, MD             DATE OF BIRTH:  1945-11-22   DATE OF ADMISSION:  10/09/2003  DATE OF DISCHARGE:                                HISTORY & PHYSICAL   CHIEF COMPLAINT:   CHIEF COMPLAINT:  Left facial pain.   HISTORY OF PRESENT ILLNESS:  [name] is a 65 year old black female who  presents to the emergency room with an hour's worth of left facial pain.  She also is saying that her right leg is hurting her.  She apparently was  seen under code stroke circumstances by Dr. Vickey Huger.  The patient has had  admissions in the past for similar symptoms and had a negative workup.  The  patient reports that today's facial pain feels similar to the one that she  had in May 2004 except this is more severe. She has not seen Dr. Joycelyn Rua, her primary care physician, for some time and ran out of her  medications two weeks ago.  She felt well and felt that she was on too many  medicines, so she simply did not call for refills.  The patient's blood  pressure is very high here in the emergency room, and Dr. Vickey Huger has asked  that the hospitalists admit the patient.   PAST MEDICAL HISTORY:  1. Stroke with dysarthria and lower extremity weakness resulting in     wheelchair bound status.  2. Hypertension.  3. Diabetes.  4. Hyperlipidemia.  5. History of ischemia of the foot.  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   MEDICATIONS:  None.  She had been on:  1. Protonix.  2. Potassium  3. Actos 30 mg  4. Altace 10 mg.  5. Metoprolol XL 50 mg.  6. Plavix 75 mg.  7. Aspirin.  8. Hydrochlorothiazide 25 mg.  9. Norvasc 5 mg.  10.      Mevacor 40 mg.   SOCIAL HISTORY:  The patient is here with her daughter.  She lives alone and  has an aide during the  day.  She does not smoke or drink.   FAMILY HISTORY:  Noncontributory.   REVIEW OF SYSTEMS:  As above, otherwise negative.   PHYSICAL EXAMINATION:  VITAL SIGNS:  Blood pressure initially was 174/123  and is now 219/109.  Respiratory rate 20, temperature 97.8, heart rate 75.  GENERAL:  Well-nourished, well-developed, in no acute distress.  HEENT:  Normocephalic and atraumatic.  Her right pupil is fixed.  NECK:  Supple.  No carotid bruits.  LUNGS:  Clear to auscultation bilaterally without wheeze, rhonchi, rales.  CARDIOVASCULAR:  Regular rate and rhythm without murmurs, gallops, or rubs.  ABDOMEN:  Minimal bowel sounds. Soft, nontender, nondistended.  GU/RECTAL:  Deferred.  EXTREMITIES:  She has edema in both legs, right greater than left.  She has  no calf tenderness.  She has full range of motion in her right ankle and is  having slight tenderness over the first MTP joint on the right.  Pulses are  intact.  NEUROLOGIC:  The patient has a fixed pupil.  She has slurred speech.  Strength in upper extremities is 5/5.  Lower extremity strength 2/5.  Upgoing Babinski on the left.  SKIN:  No rash.  PSYCHIATRIC:  Normal affect.   LABORATORY AND X-RAY DATA:  CBC unremarkable except for hemoglobin 16,  hematocrit 47.  PT/PTT normal.  Basic metabolic panel significant for  glucose 296, otherwise unremarkable.  CPK and troponin are normal.   EKG shows sinus tachycardia with left atrial enlargement, LVH by voltage,  with strain pattern.   CT of the brain shows nothing acute.   IMPRESSION AND PLAN:  1. Hypertension urgency secondary to noncompliance with medication.  I will     resume her outpatient medications and try to streamline them as much as     possible as one of the patient's major problems is the shear number of     medicines she takes.  It may be a good idea to increase the metoprolol     and ACE inhibitor and stop the Norvasc to try and streamline this. She     will be admitted  to telemetry in case she needs a drip of some kind.  2. Right facial pain. Question neuralgia.  Question psychogenic.  The     patient has been worked up for similar complaints in the past.  Workup     was negative.  3. Diabetes.  I will resume Actos and monitor CBGs.  The patient will be on     a diabetic diet.  4. Hyperlipidemia.  Resume Mevacor.  5. History of cerebrovascular accident with dysarthria and wheelchair-bound     status.  6. Foot pain.  The patient does have a slight tenderness at the first     metatarsophalangeal joint.  I will get an x-ray.  The patient will also     be on deep vein thrombosis prophylaxis.                                                Corinna L. Lendell Caprice, MD    CLS/MEDQ  D:  10/09/2003  T:  10/09/2003  Job:  [id number]

## 2010-09-15 NOTE — Consult Note (Signed)
NAME:  Allison Washington, Allison Washington            ACCOUNT NO.:  [id number]   MEDICAL RECORD NO.:  [id number]          PATIENT TYPE:  INP   LOCATION:  4735                         FACILITY:  [facility]   PHYSICIAN:  John C. Madilyn Fireman, M.D.    DATE OF BIRTH:  Nov 03, 1945   DATE OF CONSULTATION:  10/04/2005  DATE OF DISCHARGE:                                   CONSULTATION   REASON FOR CONSULTATION:  GI bleed.   HISTORY OF ILLNESS:  The patient is a 65 year old black female with a  complicated medical history mainly related to multiple CVAs.  She was  discharged a week ago after having had another stroke.  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  The history  is obtained primarily from the admission history and physical, given that  the patient is alone and unable to speak other than to indicate yes or  no to questions, and she was able to indicate to me that she is hungry.  She is not aware of having any rectal bleeding but apparently when brought  to the emergency room she was found to have hematochezia there.  At any  rate, she had a hemoglobin of 6.6, which was down to 10.7 on Sep 18, 2005,  during her last hospitalization.  She has been given some blood and her  hemoglobin has improved to 7.6 today, she is receiving further blood.  She  denies any abdominal pain and is able to tell me she is hungry.   PAST MEDICAL HISTORY:  1.  Multiple CVAs.  2.  Uncontrolled diabetes.  3.  Chronic dysphagia.  Refusing modified barium swallow during the last      admission.  It is unclear to me how much she is able to eat or drink      safely.  4.  History of sacral decubitus ulcer.  5.  History of peripheral vascular disease.  6.  History of status epilepticus.  7.  History of nonketotic hyperosmolar coma.  8.  History of renal insufficiency.   MEDICATIONS ON ADMISSION:  Aggrenox, Lovenox, Norvasc, Protonix, Catapres,  Zocor, sliding scale insulin, Lantus insulin,  lisinopril, labetalol.   SOCIAL HISTORY:  The patient lives in [facility].  She has no  alcohol or tobacco use.   ALLERGIES:  NONE KNOWN.   PHYSICAL EXAM:  The patient appears alert, relatively oriented but is  aphasic with very limited verbal communication.  HEART:  Regular rate and rhythm without murmur.  LUNGS:  Clear.  ABDOMEN:  Soft, nondistended with normoactive bowel sounds, no  hepatosplenomegaly.   LABS:  Hemoglobin 7.6, WBC 10,500, platelets 260,000, BUN 40, creatinine  1.3.   IMPRESSION:  Reported hematochezia documented primarily in the emergency  room yesterday, unclear whether she has continued to bleed today.   PLAN:  Will document all stools and follow hemoglobin.  Obviously, this  presents management difficulties given her multiple CVAs and previous  anticoagulation.  I will need more information before being able to  determine whether she can safely take a bowel prep for colonoscopy which,  ideally,  she should have, although while colonoscopy and lower GI bleed is  often diagnostic it is rarely therapeutic and may not make decision-making  regarding anticoagulation easy.  Will follow with you and discuss management  further with primary M.D.           ______________________________  Everardo All. Madilyn Fireman, M.D.     JCH/MEDQ  D:  10/04/2005  T:  10/05/2005  Job:  [id number]

## 2010-09-15 NOTE — Consult Note (Signed)
. [facility]  Patient:    Allison Washington, Allison Washington Visit Number: [id number] MRN: [id number]          Service Type: MED Location: 3000 [id number] Attending Physician:  Willow Ora Dictated by:   Llana Aliment. Randa Evens, M.D. Proc. Date: 08/07/01 Admit Date:  07/31/2001   CC:         Camille L. Mardelle Matte, M.D.   Consultation Report  DATE OF BIRTH:  [birth year]  HISTORY:  Ms. Oyer is a 65 year old woman who was admitted with a stroke. She has a history of peripheral vascular disease and diabetes and has had several previous strokes and presented with inability to speak and right hemiparesis.  She has been evaluated and unfortunately has required tube feedings.  She has not been able to swallow.  She has been evaluated by speech pathology.  Speech pathology revealed aspiration.  She failed a swallowing test and had this subsequently repeated and failed this again.  ______ procedure revealed superior oropharyngeal dysphagia with silent aspiration. For these reasons a PEG was recommended.  The patient had been on tube feeding.  She pulled out the tube feeding and did not want the tube put back down.  Apparently she is very hesitant to go with the PEG due to desire not to have a permanent feeding tube.  CURRENT MEDICATIONS:  Insulin, Benadryl, aspirin, Lovenox, Prevacid, Vasotec, hydrochlorothiazide, Remeron, nicotine patch, Actos, labetalol, Lopressor, Tylenol.  ALLERGIES:  None.  PAST MEDICAL HISTORY:  She does have diabetes with only medication is insulin, hypertension, peripheral vascular disease, arthritis.  No previous surgeries.  SOCIAL HISTORY:  She is a smoker for 30 years.  Does not drink.  Has a daughter and son who live here in [place].  She is widowed.  FAMILY HISTORY:  Mom died from colon cancer at 45.  Father died of diabetes.  PHYSICAL EXAMINATION  VITAL SIGNS:  Patient is afebrile.  Vital signs normal.  GENERAL:  Pleasant white  female who is unable to speak, but does write on a tablet.  Asked appropriate questions.  HEART:  Regular rate and rhythm without murmurs or gallops.  LUNGS:  Clear.  ABDOMEN:  Soft and nontender.  ASSESSMENT:  Marked impairment of ability to take in adequate nutrition due to stroke.  Long talk with the patient about PEG.  I explained the procedure to her.  She asked questions and wrote these questions on a tablet.  The concern that she has is whether or not that I could guarantee this would be a temporary PEG and that it could be removed.  I told her I could not guarantee that.  She seems somewhat hesitant about going ahead with it.  I did indicate to her that whether or not it was temporary or permanent would depend on how well she did with her therapy.  I explained to her that many patients with strokes will improve to the point that they no longer need PEGs.  PLAN:  Will go ahead and tentatively set her up for Monday at 12:30.  Will hold her Lovenox on Sunday.  I would suggest replacing pan until Monday. Dictated by:   Llana Aliment. Randa Evens, M.D. Attending Physician:  Willow Ora DD:  08/07/01 TD:  08/08/01 Job: [id number]

## 2010-09-15 NOTE — Consult Note (Signed)
NAME:  Allison Washington, BOHLKEN NO.:  [id number]   MEDICAL RECORD NO.:  [id number]                   PATIENT TYPE:  EMS   LOCATION:  MAJO                                 FACILITY:  [facility]   PHYSICIAN:  Melvyn Novas, M.D.               DATE OF BIRTH:  June 16, 1945   DATE OF CONSULTATION:  DATE OF DISCHARGE:                                   CONSULTATION   CONSULTING PHYSICIAN:  Melvyn Novas, M.D.   Ms. Griffey presented today at 1 a.m. to the emergency room an hour after  having left sided facial spasms and pain.  In addition, she has an extremely  high blood pressure and has complained over the last days of progressive  right sided lower extremity weakness.  The patient has a history of  spasticity on the right, had a right parietal stroke which left her with  left sided weakness and upgoing toe on previous evaluations, and is reported  by e-chart to have a history of noncompliance with her medication.  Indeed,  she discontinued her antidiabetic, and antihypertensive, and anti-  cholesterol medications.  She did not even take her aspirin.   PAST MEDICAL HISTORY:  1. IDDM.  2. Hypertension.  3. DVT.  4. Anemia.  5. Elevated cholesterol.  6. Reduced ejection fraction.   The patient can not state why she discontinues her medication, only that she  did it approximately 14 days ago.  She is a very poor historian and has a  distorted form of dysarthria that I do not believe is organic.  The  patient's symptoms had started over 24 hours.  Her CT shows no new stroke.  I discussed the findings with Dr. Constance Goltz.   MEDICATIONS:  Currently none.   LIPITOR is listed as an allergy.   PHYSICAL EXAMINATION:  VITAL SIGNS:  Blood pressure 219/109, respiratory  rate 20, temperature 97.8, heart rate is over 100.  LUNGS:  No wheezing, no rhonchi.  NECK:  No carotid bruit.  The patient does not provide range of motion  testing for the neck due to pain.  EXTREMITIES:   No edema.  No clubbing.  HEENT:  Pupils do react very sluggishly to light, on the right side slower  than on the left.  I did not see retinal abnormalities at least no  papilledema.  The patient does not comply with visual field evaluation.  She  has facial asymmetry which has been described in evaluations in 2004, 2003.  A stuttering distorted dysarthria is associated with the left facial droop  but interestingly neither tongue or uvula deviation is noticed.  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  Deep tendon reflexes of the upper extremities  are 2+.  The patient refuses to have her lower extremities evaluated  and  actually screamed when the nurses tried to get her blood pressure.  She is  fairly agitated.  I have tried to obtain a Babinski response and obtained  one on the left which is not the side she complains about weakness  __________  .  __________  shows dysmetria left and right.  Gait had to be  deferred due to the patient's refusal to comply.   ASSESSMENT:  Noncompliant patient, history of previous admissions with  similar complaints.  By computed tomography scan multiple old strokes which  match computed tomography scans from November and May last year.  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  At that time, they questioned if the patient had a  psychogenic origin for her noncompliance with medication and her failure to  follow up with outpatient care.  Her blood pressure needs to be managed, so  that the patient probably can not avoid admission tonight.   I recommend internal medicine to manage her blood pressure, and magnetic  resonance imaging brain in the morning, and I would strongly recommend a   psychiatric consultation.                                               Melvyn Novas, M.D.    CD/MEDQ  D:  10/09/2003  T:  10/10/2003  Job:  [id number]

## 2010-09-15 NOTE — Discharge Summary (Signed)
NAME:  Allison Washington, SAVOIA NO.:  [id number]   MEDICAL RECORD NO.:  [id number]          PATIENT TYPE:  INP   LOCATION:  3032                         FACILITY:  [facility]   PHYSICIAN:  Isidor Holts, M.D.  DATE OF BIRTH:  [birth year]   DATE OF ADMISSION:  10/03/2005  DATE OF DISCHARGE:  10/10/2005                                 DISCHARGE SUMMARY   PRIMARY CARE PHYSICIAN:  Maxwell Caul, M.D., [facility].   DISCHARGE DIAGNOSES:  1.  Recurrent cerebrovascular accidents.  2.  Hematochezia.  3.  Seizure disorder.  4.  Dysarthria.  5.  Dysphasia and dysphagia.  6.  Hypertension.  7.  Diabetes mellitus.  8.  Dyslipidemia.  9.  Anemia of chronic disease.  10. History of renal insufficiency.  11. Urinary tract infection.   DISCHARGE MEDICATIONS:  1.  Catapres TTS-3 patch 0.3 mg/24 hours one patch to skin each week.  2.  Lantus insulin 35 units subcutaneously q.h.s.  3.  Duragesic patch 25 mcg/hr. one patch to skin q.72h.  4.  Sliding scale insulin with NovoLog as follows:  CBG 60-100, no insulin;      CBG 101-150, 1 units; CBG 151-200, 2 units; CBG 201-250, 3 units; CBG      251-300, 5 units; CBG 301-350, 7 units; CBG over 350, 9 units.  5.  Lisinopril 40 mg p.o. daily.  6.  Labetalol 200 mg p.o. b.i.d.  7.  Amlodipine 10 mg p.o. daily.  8.  Simvastatin 40 mg p.o. q.h.s.  9.  Aggrenox one pill p.o. b.i.d.  10. Protonix 40 mg daily.  11. Detrol LA 4 mg p.o. daily.  12. K-Dur 40 mEq p.o. daily.  13. Tylenol 650 mg p.o. p.r.n. q.4h.  14. Dysphagia II diet (minced/finely chopped), carbohydrate-modified diet,      healthy heart diet, full range of liquids.   PROCEDURES:  1.  Head CT scan dated October 03, 2005.  This showed no acute intracranial      abnormality with multiple lacunar infarctions and chronic microvascular      ischemic change noted.  2.  Chest x-ray dated October 04, 2005.  This showed cardiomegaly and chronic-      appearing interstitial change.   No acute finding.  3.  Brain MRI dated October 07, 2005.  This showed background pattern of      extensive chronic small vessel disease affecting the brain stem,      thalami, basal ganglia and hemispheric deep white matter.  There were      multiple scattered acute strokes in the deep white matter of both      hemispheres as well as the corpus callosum.  4.  Colonoscopy dated October 08, 2005, by Graylin Shiver, M.D.  This showed      diverticulosis but no evidence of acute bleeding.  5.  Esophagogastroduodenoscopy dated October 08, 2005, by Graylin Shiver, M.D.      This showed diffuse gastritis.  Biopsies were taken.  Pathology was      negative for Helicobacter pylori.  There was benign antral and body-type  mucosa with no evidence of active mucosal inflammation, metaplasia or      dysplasia.   CONSULTATIONS:  1.  Bernette Redbird, M.D., gastroenterologist.  2.  Everardo All. Madilyn Fireman, M.D., gastroenterologist.  3.  Graylin Shiver, M.D., gastroenterologist.  4.  Marolyn Hammock. Thad Ranger, M.D., neurologist.   ADMISSION HISTORY:  Per H&P note of October 03, 2005, dictated by Lonia Blood,  M.D.  However, in brief, this is a 65 year old female, who has a known  history of multiple previous CVAs, type 2 diabetes mellitus, dysphagia,  dyslipidemia, hypertension, peripheral vascular disease, status post right  AKA, seizure disorder, and renal insufficiency as well as anemia of chronic  disease, who presents with complete aphasia and catatonia.  On arrival in  the ED she was noted to have hematochezia.  The patient was admitted for  further evaluation, investigation and management.   CLINICAL COURSE:  Problem 1.  CEREBROVASCULAR ACCIDENT:  The patient apparently was discharged  fairly recently, approximately one week prior to this presentation,  following a CVA.  She now presents with aphasia/catatonia and was found to  have a recurrence of CVA.  Brain imaging scans were done, including head CT  scan and MRI,  which showed multiple acute strokes in the deep white matter  of both hemispheres as well as the corpus callosum.  The patient is already  on risk factor modification with statins, antihypertensives and Aggrenox.  The question, however, was of whether anticoagulation would be required. As  mentioned in the admission history above, the patient was noted to have  hematochezia in the emergency department, effectively militating against  anticoagulation.  A neurology consultation was called.  This was kindly  provided by Dr. Thad Ranger, who felt that for the moment, anticoagulation was  probably not indicated, but that this may be required in the future.  Should  that be the case, the patient would need a TEE as well a bubble study.  The  patient responded to physical/occupational therapy and as of October 10, 2005,  although still has expressive dysphasia, has no evidence of receptive  dysphasia and no focal neurology beyond her baseline.  She has been  evaluated by speech pathology, given her background history of dysphagia and  has been recommended D-II dysphagia diet with minced and chopped food as  well as full range of liquids. Of course, full aspiration precautions  recommended.  Arrangements have been put in place for the patient to follow  up with Dr. Thad Ranger on November 14, 2005, at [facility], at  9:30 a.m.   Problem 2.  HEMATOCHEZIA:  The patient was noted to have hematochezia in the  emergency department.  Gastroenterology consultation was called, which was  provided initially by Dr. Madilyn Fireman.  The patient was subsequently seen by Dr.  Bernette Redbird and on October 08, 2005, she underwent an upper and lower GI  endoscopy by Dr. Herbert Moors.  This showed diverticulosis and chronic  gastritis but no culprit lesions for hematochezia.  The patient is currently  on a proton pump inhibitor, but clearly against a background of possible recurrent GI bleed, anticoagulation is  contraindicated for now.   Problem 3.  TYPE 2 DIABETES MELLITUS:  This was controlled with a  combination of a carbohydrate-modified diet, sliding scale insulin coverage  as well as Lantus.  The patient remained euglycemic.   Problem 4.  HYPERTENSION:  The patient has required multiple  antihypertensive medications and currently blood pressure ranges between  112/55 to  160/57.  This range is probably adequate, against a background of  recent CVA, but this will need to be monitored by the patient's primary MD  in due course, and medications adjusted as indicated.   Problem 5.  URINARY TRACT INFECTION:  At the time of presentation the  patient was found to have a positive urinary sediment, consistent with a  urinary tract infection.  She was treated with ciprofloxacin and concluded  treatment on October 10, 2005.  Urinalysis of October 09, 2005, was negative,  confirming resolution of infection.   Problem 6.  SEIZURE DISORDER:  The patient remained asymptomatic from this  viewpoint, during the course of her hospital stay.   Problem 7.  DYSLIPIDEMIA:  The patient was managed with statin, during the  course of her hospital stay.   Problem 8.  ANEMIA OF CHRONIC DISEASE:  Hemoglobin remained stable, during  the course of the patient's hospital stay.  As a matter of fact, on October 09, 2005, hemoglobin as 10.6 with a hematocrit of 31.1.   Problem 9.  HISTORY OF SACRAL DECUBITUS:  The patient was evaluated by the  wound care team during her hospital stay.  Sacral decubitus appears to be  healed, and the recommendation was for protection of the newly-formed skin  and frequent turning to avoid further decubiti.   DISPOSITION:  The patient was considered sufficiently recovered and  clinically stable to be discharged on October 10, 2005.   DIET:  D-II diet (minced/finely chopped)/carbohydrate-modified/healthy heart  diet.   ACTIVITY:  Per PT/OT.   WOUND CARE:  Not applicable.   PAIN MANAGEMENT:   Refer to discharge medication list.   FOLLOW-UP INSTRUCTIONS:  The patient is to return to Dr. Thad Ranger, [facility], telephone number [phone], on November 14, 2005, at 10:30  a.m.  She is to follow up routinely with Dr. Baltazar Najjar, [facility], and certainly within 1-2 weeks.      Isidor Holts, M.D.  Electronically Signed     CO/MEDQ  D:  10/10/2005  T:  10/10/2005  Job:  [id number]   cc:   Maxwell Caul, M.D.  Fax: [phone]   Marolyn Hammock. Thad Ranger, M.D.  Fax: [phone]

## 2010-09-15 NOTE — Discharge Summary (Signed)
NAME:  Allison Washington, Allison Washington NO.:  [id number]   MEDICAL RECORD NO.:  [id number]          PATIENT TYPE:  INP   LOCATION:  5502                         FACILITY:  [facility]   PHYSICIAN:  Rene Paci, M.D. LHCDATE OF BIRTH:  1945/12/05   DATE OF ADMISSION:  09/16/2005  DATE OF DISCHARGE:  09/21/2005                                 DISCHARGE SUMMARY   DISCHARGE DIAGNOSES:  1.  Subacute cerebrovascular accident.  2.  Diabetes, type 2, uncontrolled.  3.  History of chronic dysphagia.  4.  Dyslipidemia.  5.  Hypertension.  6.  History of decubitus ulcers.  7.  Anemia.   HISTORY OF PRESENT ILLNESS:  The patient is a 65 year old female admitted on  09/16/2005 with a chief complaint of right-sided numbness and dysarthria.  She was initially evaluated by Dr. Orlin Hilding of neurology who felt that the  stroke was subacute and recommended admission to her primary care team.  The  patient was admitted for further evaluation and treatment.   PAST MEDICAL HISTORY:  1.  Right above the knee amputation.  2.  Sacral decubitus ulcer.  3.  Diabetes, type 2, poorly controlled.  4.  Dyslipidemia.  5.  Cerebrovascular accidents, multiple.  6.  Seizure disorder secondary to cerebrovascular accidents.  7.  Hypertension.   COURSE OF HOSPITALIZATION BY PROBLEMS:  1.  Subacute cerebrovascular accidents:  The patient was admitted and      underwent a CT of the head.  The CT revealed no acute intracranial      abnormalities.  It did note multiple old bilateral cerebral lacunar      strokes and an old left pontine lacunar stroke.  In addition, severe      changes of small vessel disease of the white matter were noted.  She,      then, underwent an MRI/MRA which revealed premature atrophy with      extensive chronic microvascular ischemic change with no definite acute      stroke.  This was followed by a 2-D echo which revealed a normal left      ventricular ejection fraction with a range of  50-55%.  The patient was      seen during this admission by physical therapy and occupational therapy      who recommended returning to a short-term skilled nursing.  The patient      and her family wished for the patient to go to skilled nursing and plans      to transfer patient to [facility] this afternoon.  2.  Uncontrolled diabetes, type 2:  Lantus is increased during this      hospitalization, and the patient's blood sugars had improved.  Current      blood sugars are ranging between 93 and 202.  Plan to continue current      regimen at discharge.  The patient's hemoglobin A1c was 10.1 on this      admission.  3.  Hypertension:  The patient's blood pressure is currently controlled on a      multi-drug regimen.  See below.  Plan to continue current medications  at      discharge.  4.  Dyslipidemia:  Total cholesterol was 326, triglycerides 745, HDL 36.      She was initiated on a statin during this admission which will be      continued at the time of discharge.  5.  History of decubitus ulcer:  The patient has a known history of      decubitus ulcers and a would care eval was requested during this      admission.  It was noted that the right buttocks area had no open areas      or drainage noted, that it was a healed scarred area.  Mepilex Lite was      applied for protection.  6.  Severe constipation:  The patient complained of severe constipation      which was accompanied by some mild vomiting.  A moderate-sized fecal      impaction was noted in the mid and upper rectum with no definite      obstruction or perforation.  The patient was given multiple laxatives, a      tap water enema, and has been able to successfully have a bowel movement      and is currently feeling much better.  7.  Anemia:  The patient's hemoglobin during this admission was noted to be      9.9.  Iron studies were performed.  The patient's iron level was noted      to be normal.  B21 and folate were also  normal.  8.  History of dysphagia:  The patient has a known history of dysphagia      which was evaluated by speech therapy during this diabetes mellitus.      Speech therapy recommended modified barium swallow to rule out      __________ aspiration; however, the patient adamantly refused the      modified barium swallow and also refused anything in liquid.  She was      placed on a regular diet with thin liquids.  The patient understands      risk of aspiration.   DISCHARGE MEDICATIONS:  1.  Norvasc 10 mg by mouth daily.  2.  Oxycodone 10 mg orally every 8 hours.  3.  Detrol 4 mg by mouth daily.  4.  Protonix 40 mg by mouth daily.  5.  Catapres patch 0.3 mg per 24 hours to be changed every 7 days.  6.  Zocor 40 mg by mouth daily.  7.  NovoLog sliding scale 2 before meals and at bedtime per protocol.  8.  Aggrenox 1 cap by mouth daily through 09/30/2005 then start 1 cap by      mouth twice a day.  9.  Tylenol 650 by mouth every 24 hours.  10. Lantus insulin 35 units subcutaneously at bedtime.  11. Lisinopril 40 mg by mouth daily.  12. Labetalol 200 mg by mouth twice a day.  13. Lovenox 40 mg subcutaneously daily for deep venous thrombosis      prophylaxis.   PHYSICAL EXAMINATION:  VITAL SIGNS:  BP 134/64, heart rate 78, respiratory  rate 20, temp 97.7, O2 sat 96% on room air.  GENERAL:  The patient is sleeping, easily arouseable, in no acute distress.  CARDIOVASCULAR:  S1 S2, regular rate and rhythm.  LUNGS:  Clear to auscultation bilaterally with no wheezes, rales, or  rhonchi.  ABDOMEN:  Soft, nontender, nondistended.  EXTREMITIES:  The patient is moving all extremities.  She  has a right above  the knee amputation.  No peripheral edema noted.  NEURO:  The patient is awake, alert, and moving all extremities.  Speech is  slurred and is currently at baseline.   DISPOSITION:  Plan to transfer the patient to [facility] __________.  DISCHARGE DIET:  She is on a carb-modified  diet with thin liquids as she  refuses any dysphagia diet.   DISCHARGE FOLLOWUP:  The patient will need followup as needed and upon  discharge from the facility with Dr. Gershon Crane, her primary care  physician.      Melissa S. Peggyann Juba, NP      Rene Paci, M.D. [facility]  Electronically Signed    MSO/MEDQ  D:  09/21/2005  T:  09/21/2005  Job:  [id number]   cc:   Jeannett Senior A. Clent Ridges, M.D. [facility]  [address] [place]  [place] [address]

## 2010-09-15 NOTE — Op Note (Signed)
NAME:  [name], [name] NO.:  [id number]   MEDICAL RECORD NO.:  [id number]          PATIENT TYPE:  INP   LOCATION:  3032                         FACILITY:  [facility]   PHYSICIAN:  Graylin Shiver, M.D.   DATE OF BIRTH:  06/17/1945   DATE OF PROCEDURE:  10/08/2005  DATE OF DISCHARGE:                                 OPERATIVE REPORT   PROCEDURE:  Esophagogastroduodenoscopy with biopsy.   INDICATIONS FOR PROCEDURE:  Anemia, heme positive stool,  hematochezia.   Informed consent was obtained after explanation of the risks of bleeding,  infection, and perforation.   PREMEDICATION:  1.  Fentanyl 30 mcg IV.  2.  Versed 3 mg IV.   PROCEDURE:  With the patient in the left lateral decubitus position, the  Olympus gastroscope was inserted into the oropharynx and passed into the  esophagus.  It was advanced down the esophagus, then into the stomach and  into the duodenum.  The second portion and bulb of the duodenum looked  normal.  The stomach showed a diffuse erythematous appearance to the mucosa  compatible with gastritis.  Biopsies were obtained for histology. No lesions  were seen in the fundus or cardia. The esophagus looked normal in its  entirety.  She tolerated the procedure well without complications.   IMPRESSION:  Diffuse gastritis.   PLAN:  The biopsies will be checked.           ______________________________  Graylin Shiver, M.D.     SFG/MEDQ  D:  10/08/2005  T:  10/08/2005  Job:  [id number]   cc:   [facility]

## 2010-09-15 NOTE — Consult Note (Signed)
NAME:  RUDOLPH, DOBLER NO.:  [id number]   MEDICAL RECORD NO.:  [id number]          PATIENT TYPE:  INP   LOCATION:  5502                         FACILITY:  [facility]   PHYSICIAN:  Gustavus Messing. Orlin Hilding, M.D.DATE OF BIRTH:  Sep 17, 1945   DATE OF CONSULTATION:  09/16/2005  DATE OF DISCHARGE:                                   CONSULTATION   CHIEF COMPLAINT:  Right-sided numbness and dysarthria.   PRIMARY PHYSICIAN:  Dr. Clent Ridges of [facility]   HISTORY OF PRESENT ILLNESS:  Ms. Twardowski is a 65 year old left-handed black  woman with complicated past medical history including multiple strokes in  the past, poorly controlled diabetes with recent admission for right AKA  [phone] secondary to vascular disease from her diabetes.  She was in a  nursing home until two weeks ago and has been home in the same apartment  building as her daughter but this afternoon at around 3:50 p.m. she had  sudden onset of worsening dysarthria and dysphagia which actually are  present from before.  EMS was called and she was transported to the  emergency room.   REVIEW OF SYSTEMS:  She complained of some pain left hand IV site.  She has  a decubitus ulcer in the right buttock and she has been bleeding from the  rectum secondary to hemorrhoids.   PAST MEDICAL HISTORY:  1.  Significant for poorly controlled type 2 diabetes with admission for      diabetic coma in the past.  2.  Hypertension.  3.  Acute renal failure, although her creatinine had returned to 1 at the      time of discharge in March.  4.  She has anemia of chronic disease.  5.  Seizure onset in March2007 that she was seen by Dr. Anne Hahn in consult,      felt to be due to multiple strokes and her high sugars.  6.  She has hyperlipidemia.  7.  Chronic dysarthria.   MEDICATIONS:  Detrol, amlodipine, labetalol, clonidine, lisinopril, and  Plavix.  Doses are unknown.   ALLERGIES:  It is listed as no known drug allergies but I did fine one  place  in the chart where it is listed as LIPITOR.   SOCIAL HISTORY:  No cigarette or alcohol use.   FAMILY HISTORY:  Noncontributory.   PHYSICAL EXAMINATION:  VITAL SIGNS:  Temperature is 97.3, pulse 74,  respirations 12, blood pressure 184/83.  HEENT:  Head:  Normocephalic, atraumatic.  NECK:  Supple without bruits.  She has a right AKA.  NEUROLOGIC:  On the neuro stroke scale level of consciousness MeSH is  normal.  She gets a zero.  Answers she answers two questions correctly, gets  a zero.  Follows 2/2 commands appropriately, gets a zero.  Best gaze, full  horizontal gaze gets a zero.  Visual fields:  She may have a partial left  upper quadrant anopsia.  Facial palsy:  She has very mild right facial  droop, gets a 1 for that.  She gets a 1 for the partial hemianopsia and the  facial droop, each.  On motor examination  there is no drift of the left  upper extremity or lower extremity.  She has an AKA on the right but there  is no drift of the stump.  She does have a mild drift of the right upper  extremity.  No ataxia.  SENSORY:  She has decreased sensation on the right,  gets a 1.  No aphasia, but does have a moderate dysarthria.  No extinction  was noted.  The total score is a 5.   CT scan shows multiple old lacunar infarcts, small vessel disease, no blood,  no definite acute abnormality.  Laboratories are pending, although a CBG via  EMS was read as greater than 352.   IMPRESSION:  Right face and arm weakness and decreased sensation, and  dysarthria in a patient with history of previous stroke with dysarthria.  It  is unclear which symptoms are new. Although time-wise she would be a TPA  candidate, I am not recommending using it because of a history of  gastrointestinal bleeding from hemorrhoids and decubitus ulcer, and recent  major surgery.   RECOMMENDATIONS:  MRI of the head, MRA of the head and neck, 2-D  echocardiogram, carotid Doppler, homocystine levels, lipid panel.   Would  consider Aggrenox.  Will defer management of her blood pressure and diabetes  to internal medicine.  Would recommend she be n.p.o. until swallow  evaluation.      Catherine A. Orlin Hilding, M.D.  Electronically Signed     CAW/MEDQ  D:  09/16/2005  T:  09/17/2005  Job:  [id number]

## 2010-09-15 NOTE — Consult Note (Signed)
NAME:  Allison Washington, Allison Washington            ACCOUNT NO.:  [id number]   MEDICAL RECORD NO.:  [id number]          PATIENT TYPE:  INP   LOCATION:  5737                         FACILITY:  [facility]   PHYSICIAN:  James L. Malon Kindle., M.D.DATE OF BIRTH:  March 21, 1946   DATE OF CONSULTATION:  10/16/2005  DATE OF DISCHARGE:  10/19/2005                                   CONSULTATION   The patient to be admitted to the [facility].   PRIMARY CARE PHYSICIAN:  Maxwell Caul, M.D.   HISTORY:  The patient is a nursing home resident and is unable to give any  history.  She shakes her head yes and no.  Denies abdominal pain or  constipation.  She was sent from the nursing home with a large amount of  bright red blood and 2 bloody bowel movements in the emergency room.  Patient was admitted from October 03, 2005 to June 13, with multiple medical  problems including hematochezia, had a negative endoscopy at that time  except some gastritis, colonoscopy was negative, and diverticular disease.  It appears that she did okay after going home, but the bleedings started  back again.   ADMISSION MEDICATIONS:  Catapres, Lantus insulin, Duragesic patch, sliding-  scale insulin, lisinopril, labetalol.   ALLERGIES:  NO KNOWN DRUG ALLERGIES.   PAST MEDICAL HISTORY:  History of peripheral vascular disease, diabetes, has  had a right AKA, has a history of ventilator dependent respiratory failure,  strokes.  She has had dysphagia and dysarthria and was dysphagia 2 diet in  June recently.  She has had to have panda tubes in the past.  She also had a  history of renal failure, resolved.  Still does have chronic renal  insufficiency.  She has had a seizure disorder felt to be due to previous  strokes.  She has had a history of nonketotic osmolar coma.   FAMILY HISTORY:  Unable to obtain.   SOCIAL HISTORY:  She is a resident of [facility], followed by Dr.  Leanord Hawking, is a full code.   PHYSICAL EXAMINATION:   GENERAL:  Patient is alert and responds but is unable  to speak.  VITAL SIGNS:  Normal.  Systolic blood pressure 110, pulse 95 to 100.  EYES:  Sclerae nonicteric.  THROAT:  Normal.  HEART:  Regular rate and rhythm with no murmurs or gallops.  ABDOMEN:  Soft and nontender.  RECTAL:  Blood and stool that is maroon-ish to dark.   LABORATORY DATA:  Pending.  BUN and creatinine are unchanged.  All other  labs are not back yet.   ASSESSMENT:  Lower GI bleed, probably a recurrent diverticular bleed that  appears to be slowing down and she does appear to be hemodynamically stable.  At this point, I doubt a repeat endoscopy or colonoscopy would add anything  since she just had this done recently.   RECOMMENDATIONS:  Will start her on clear liquids.  Give her MiraLax.  Replete with blood and fluids.  If bleeding continues she may need a  bleeding scan or surgery consult.  ______________________________  Llana Aliment Malon Kindle., M.D.     Waldron Session  D:  12/20/2005  T:  12/21/2005  Job:  [id number]   cc:   Maxwell Caul, M.D.

## 2010-09-15 NOTE — Discharge Summary (Signed)
[place]. [facility]  Patient:    Allison Washington, Allison Washington Visit Number: [id number] MRN: [id number]          Service Type: [facility] Location: [id number] Attending Physician:  Herold Harms Dictated by:   Ellwood Handler, M.D. Admit Date:  08/11/2001 Disc. Date: 08/11/01                             Discharge Summary  DISCHARGE DIAGNOSES: 1. Cerebrovascular accident. 2. Hypertension. 3. Diabetes mellitus. 4. Peripheral vascular disease.  DISCHARGE MEDICATIONS:  1. Lipitor 10 mg per tube.  2. Aspirin 300 mg q.d. per tube.  3. Lovenox subcu 40 mg q.d.  4. Prevacid 15 mg per tube q.d.  5. Hydrochlorothiazide 25 mg per tube q.d.  6. Remeron 15 mg slow tab p.o. q.h.s.  7. Nicotine patch taper.  8. Actos 15 mg per tube q.d.  9. Lopressor 50 mg per tube b.i.d. 10. Lantus 40 units subcu q.h.s. 11. Vasotec 5 mg IV q.6h. 12. Labetalol 10 mg IV p.r.n. for blood pressure greater than 180/100.  HISTORY OF PRESENT ILLNESS:  Please see full H&P for details.  In brief, Allison Washington is a 65 year old, African-American female with a past medical history significant for uncontrolled diabetes, uncontrolled hypertension, peripheral vascular disease, tobacco abuse with four previous strokes who presented to [facility] ED with acute onset of right-sided weakness and aphasia.  LABORATORY DATA AND X-RAY FINDINGS:  WBC 6.3, hemoglobin 13.2, hematocrit 38.9, platelets 258.  Sodium 136, potassium 3.7, chloride 100, bicarb 28, BUN 9, creatinine 0.6, glucose 322.  Albumin 3.1, Alk phos 91, AST 15, ALT 11, total bilirubin 0.5.  Urinalysis revealed glucose of 500, protein greater than 300.  Cardiac enzymes revealed CK of 84, CK-MB 2.7 and troponin I of 0.02.  DIAGNOSTIC STUDIES: 1. On July 31, 2001, computed tomography of head without contrast revealed    small scattered lacunar infarcts, likely chronic, negative for hemorrhage    or edema. 2. On July 31, 2001, chest x-ray revealed  prominent central lung markings    which could be chronic or could possibly be due to vascular congestion with    mild interstitial edema. 3. On August 01, 2001, magnetic resonance imaging of brain revealed acute    infarction of the left basal ganglia, posterior limb of the internal    capsule and the left thalamus.  Question a few scattered foci of subacute    ischemic change in the right thalamus and deep white matter of the right    hemisphere.  Background pattern of advanced chronic small vessel changes in    the brain stem and moderate chronic small vessel changes in the deep white    matter of the cerebral hemispheres. 4. On August 01, 2001, magnetic resonance angiography of the neck revealed no    significant disease identified at the arch or either carotid bifurcation. 5. On August 01, 2001, magnetic resonance angiography of the head revealed large    and medium vessel atherosclerotic change intracranially without an apparent    focal stenosis, major vascular occlusion or aneurysm. 6. On August 01, 2001, carotid Dopplers revealed no evidence of significant    internal carotid artery stenosis, external carotid artery stenosis, left    greater than right, vertebral artery flow is antegrade. 7. On August 02, 2001, modified swallowing function study revealed significant    delay in initiation of the swallowing reflex and laryngeal penetration  with nectar-thick consistency. 8. On August 04, 2001, echocardiogram revealed overall left ventricular systolic    function mildly decreased, left ventricular ejection fraction was estimated    to be 45% with range being 40% to 50%.  Left ventricular wall thickness    with mildly increased.  Aortic valve thickness mildly increased.  Mild    mitral valvular regurgitation.  Left atrium mildly dilated.  Estimated peak    right ventricular systolic pressure within the upper limits of normal.    Mild to moderate tricuspid valvular regurgitation.  No cardiac  source of    embolism. 9. On August 06, 2001, SEES test with delay in swallow initiation, decreased    laryngeal elevation and closure, decreased pharyngeal sensation with silent    aspiration.  HOSPITAL COURSE:  #1 - CEREBROVASCULAR ACCIDENT:  The patient was followed closely by the stroke team, physical therapy, occupational therapy, speech therapy and rehabilitation therapy.  She continued to have right upper extremity and lower extremity weakness.  She remained anarthric, however, communicated well by writing.  She was started on aspirin and Aggrenox for stroke prevention.  At the time of discharge, she was only on aspirin secondary to the fact that Aggrenox could not be crushed and placed in her tube.  She was dysphagic and started on tube feeds on August 02, 2001. Nutrition was following and adjusted her tube feed regimen as needed. Unfortunately on August 07, 2001, her Karie Soda tube came out and the patient refused to have it replaced.  At the recommendation of the stroke team, a GI consult was obtained to place a PEG tube.  However, the patient refused this as well.  The patient was started on a dysphagia-I diet on August 10, 2001. After she acknowledged understanding of the risk of eating including aspiration pneumonia, serious infection, both of which could be potentially life threatening.  The patient was determined to be appropriate for rehabilitation placement.  #2 - HYPERTENSION:  She continued to have uncontrolled and labile blood pressure.  Her goal blood pressure for her following this stroke would be 160-180 systolic and 80-90 diastolic.  We attempted to control her blood pressure with IV labetalol and IV Vasotec.  Once her feeding tube was in place, we also initiated hydrochlorothiazide and Lopressor.  She may benefit from a clonidine patch as well.  #3 - DIABETES MELLITUS:  Her diabetes was poorly controlled prior to admission.  She had a hemoglobin A1C of 12.6 on July 31, 2001.  She was continued on sliding scale insulin and Lantus which was increased almost daily  for tighter control subcutaneous each night.  DISPOSITION:  She was discharged to rehabilitation in stable condition. Dictated by:   Ellwood Handler, M.D. Attending Physician:  Herold Harms DD:  08/11/01 TD:  08/12/01 Job: [id number]

## 2010-09-15 NOTE — Discharge Summary (Signed)
[place]. [facility]  Patient:    Allison Washington, Allison Washington Visit Number: [id number] MRN: [id number]          Service Type: MED Location: [phone] Attending Physician:  Farley Ly Dictated by:   Dominica Severin, P.A. Admit Date:  11/19/2001 Disc. Date: 11/26/01   CC:         Nicholos Johns of [facility]             Terald Sleeper, M.D.                           Discharge Summary  DATE OF BIRTH:  [birth year]  PRIMARY ADMISSION DIAGNOSIS:  Right foot ischemia.  SECONDARY DIAGNOSES/PAST MEDICAL HISTORY: 1. 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 2. Diabetes mellitus, insulin dependent. 3. Hypertension. 4. Peripheral vascular disease. 5. Normocytic anemia.  NEW DIAGNOSES/DISCHARGE DIAGNOSES: 1. Peripheral vascular occlusive disease status post right femoral    profundaplasty with greater saphenous vein and right femoral to anterior    tibial bypass graft with composite vein from both the right and left thigh    done November 21, 2001. 2. Postoperative anemia requiring transfusions on July 24, July 26, and July    29 respectively. 3. Postoperative confusion, resolved. 4. Postoperative fever, resolved.  HOSPITAL COURSE:  This patient was admitted by the teaching service here at [facility] on November 19, 2001.  She is a 65 year old African-American female who is status post left hemispheric stroke with a right hemiparesis with a questionable ability to ambulate.  She was admitted on July 23 with increasing right foot pain.  She had a similar presentation approximately three weeks ago, had an angiogram done with two-vessel runoff to her foot. She was felt to be a candidate for a bypass at the time, depending on an adequate vein, and she was currently being worked up for that.  Nevertheless, it was determined that she had some greater saphenous vein which was of poor caliber although Dr. Elyn Peers had a long  discussion with the patient regarding her surgical options which include the bypass or a primary amputation.  Dr. Elyn Peers felt initially that a femoral to anterior tibial was not a good option due to a poor conduit and profundaplasty as possibility it may at least provide at least enough inflow not to have the pain at rest.  The patient adamantly was against amputation and said she would rather die instead of having her leg amputated.  The patients situation was discussed with the daughter and they wished to proceed with profundaplasty in the a.m. and the risks were discussed with the family, and they agreed to proceed.  She underwent the procedure as stated above on November 21, 2001.  She tolerated the procedure well.  Her postoperative course was essentially uneventful.  She remained hemodynamically stable and was stable from a pulmonary standpoint, was tolerating oxygen saturation rates greater than 90% on room air.  She did have some postoperative anemia requiring transfusions on July 26 as well as July 29 in addition to her preoperative transfusion on July 24.  Her leg remained stable from a vascular standpoint.  She had palpable pulses bilaterally and her wounds remained clean and dry without any signs of infection.  She was restarted on her Coumadin as well as continued with physical therapy.  Neurologically she did not have any change in her status from her previous stroke.  She continued to  be followed by medical teaching service and they managed her diabetes as well as other medical issues.  Her postoperative ABIs revealed an increase on the right side from 0.21 to 0.71 but they also did reveal a decrease in the left side from 0.44 to 0.27. Nevertheless, it has been determined that the patient is stable for discharge back to [facility] for further therapy as long as her INR is therapeutic.  It anticipated that she may be ready for discharge on the morning of November 26, 2001.  DISCHARGE CONDITION:  Stable.  DISCHARGE MEDICATIONS:  1. Hydrochlorothiazide 25 mg q.d.  2. Altace 5 mg q.d.  3. Colace 200 mg q.d.  4. Insulin 70/30 20 units b.i.d. a.c.  5. Ambien 5 mg p.o. q.h.s.  6. Morphine 3-5 mg IV q.1h. p.r.n. breakthrough pain.  7. Tylox 5/375 one or two tablets q.4-6h. p.r.n. pain.  8. Lantus 20 units subcu q.h.s.  9. Norvasc 5 mg q.d. 10. Actos 30 mg q.d. before meals. 11. Aspirin 81 mg q.d. 12. Protonix 40 mg q.d. with meals. 13. Lopressor 50 mg b.i.d. 14. Coumadin 7.5 mg q.d.  ACTIVITY:  She is instructed to continue therapy as directed.  She is to avoid any strenuous activity or heavy lifting.  WOUND CARE:  She was told she may shower or bathe.  They are to notify the office of any increased redness, swelling, or drainage from her incisions, or if she has any temperatures greater than 101 degrees Fahrenheit.  FOLLOW-UP:  She is to have her PT/INR checked on Friday, November 28, 2001 and sent to Dr. Leanord Hawking or whoever will be following her Coumadin levels.  She is to have her staples out in 10 days at the nursing home.  She will have a follow-up appointment scheduled with Dr. Elyn Peers in approximately two weeks with ankle-brachial indices and they will call with this appointment date and time. She was also instructed to follow up with her other caregivers as indicated. Dictated by:   Dominica Severin, P.A. Attending Physician:  Farley Ly DD:  11/25/01 TD:  11/25/01 Job: [id number]

## 2010-09-15 NOTE — Op Note (Signed)
[place]. [facility]  Patient:    Allison Washington, Allison Washington Visit Number: [id number] MRN: [id number]          Service Type: MED Location: [phone] Attending Physician:  Catalina Pizza Dictated by:   Caralee Ates, M.D. Proc. Date: 11/03/01 Admit Date:  11/02/2001                             Operative Report  REASON FOR CONSULTATION:  Right lower extremity ischemia.  HISTORY OF PRESENT ILLNESS: The patient is a 65 year old black female with multiple medical problems including severe peripheral vascular disease, hypertension and diabetes who has had multiple left hemispheric strokes, the most recent of which was in April of 2003 which left her with residual expressive aphasia and right sided weakness in her upper and lower extremities.  Two weeks ago she developed pain and swelling in her right leg and was seen in the emergency department and found to have an ankle brachial index of 0.2 at that time. She has a known right fem/pop bypass performed in [place], [place] in 1995 which appears to be an above the artery reconstruction with a PTFE conduit.  That graft obviously has failed.  No intervention was recommended at that time, however, her symptoms have progressed and she was admitted last night with similar symptoms of pain and swelling in her right lower extremity.  She was found to have worsened Doppler flow yesterday with no measurable ankle brachial index on that side. Currently, she describes some intermittent pain in her right foot.  However, she is able to flex and extend her ankle although somewhat diminished in its range of motion which according to the daughter is her baseline state. Otherwise, she denies any recent acute illness except as mentioned above.  MEDICATIONS AT HOME INCLUDE: 1. Augmentin. 2. Aspirin. 3. Hydrochlorothiazide. 4. Zocor. 5. Altace. 6. Remeron. 7. Actos. 8. Lopressor. 9. Insulin.  ALLERGIES: She denies any drug  allergies.  PAST MEDICAL HISTORY: 1. History of multiple left hemispheric strokes with no identifiable    extracranial cerebrovascular disease. 2. History of peripheral vascular disease having undergone a right fem/pop    bypass as mentioned above. According to the daughter this was done for    limb salvage and she narrowly averted an amputation at that point eight    years ago. 3. Hypertension. 4. Chronic anemia. 5. Diabetes as mentioned above.  PAST SURGICAL HISTORY:  With the exception of her fem/pop bypass, she is unable to give any other surgical history.  SOCIAL HISTORY: She has a history of cigarette smoking although cannot give an accurate account of that history.  Apparently she lives independently with a motorized wheelchair although according to the daughter she is able to walk independently with a walker.  There is no history of illicit drug use.  REVIEW OF SYSTEMS: CONSTITUTIONAL:  This is a somewhat ill appearing 65 year old black female in no acute distress. She is unable to speak due to her recent stroke.  The details of her review of systems are sketchy, however, she has not had any recent acute illness, fevers, chills, nausea, vomiting or change in her bowel or bladder habit. CARDIAC: As far as her cardiovascular history she has known hypertension and hypercholesterolemia.  PULMONARY: History of cigarette smoking although she quit at the time of her last stroke in April; the details of that are sketchy as well.  GI:  Unknown.  GU: Unknown.  SKIN:  No ischemic ulceration although she does have a callus developed on the lateral aspect of her right foot.  There are no ischemic ulcerations.  RENAL:  Negative.  PSYCHIATRIC:  Unknown.  MUSCULOSKELETAL: Negative.  PHYSICAL EXAMINATION:  GENERAL:  This is a well developed 65 year old black female.  She is in no acute distress.  VITAL SIGNS: Temperature 98.0, blood pressure 160/75, heart rate 65.  HEENT EXAM:   Negative with the exception of her expressive aphasia.  CHEST:  Clear bilaterally.  CARDIOVASCULAR:  Regular rate and rhythm.  ABDOMEN:  Soft and nontender.  EXTREMITIES: Warm and dry with the exception of her right foot which is cool as compared to the left side.  There is 1+ pedal edema bilaterally.  VASCULAR EXAM: She has 2+ Dopplerable radial, brachial and femoral pulses bilaterally.  There are no palpable popliteal or pedal pulses.  As noted above, there is monophasic flow in her dorsalis pedis and peroneal artery on the right and biphasic flow in her dorsalis pedis and posterior tibial on the left.  LABORATORIES:  White count 5.9, hemoglobin 10.8, platelets 269,000.  Potassium 3.6, sodium 143, chloride 106, C02 31, BUN 16, creatinine 0.8 and blood glucose was 78.  IMPRESSION: Right lower extremity ischemia which has been ongoing for the last 1.5 to two weeks by history.  She currently has a viable foot as she is able to move her ankle and toes, however, she clearly is profoundly ischemic. There is some flow present in her peroneal and dorsalis pedis by Doppler.  I think that her limb certainly is threatened, however, the chronicity of it dates at least two weeks.  PLAN:  I will begin IV heparin now and schedule her for an arteriogram first thing in the morning.  We will then base our plans upon the findings of that arteriogram.  I have discussed with the patient and the daughter that given the fact that she has had two weeks of profound ischemia in this foot, she may have an intractable nerve injury and/or she may have limited options for reconstruction at which point she likely would ultimately lose her leg.  They indicated that they understand her current situation and wish to proceed with the arteriogram as mentioned. Dictated by:   Caralee Ates, M.D. Attending Physician:  Catalina Pizza DD:  11/03/01 TD:  11/05/01 Job: [id number]

## 2010-09-15 NOTE — Op Note (Signed)
NAME:  KENNADI, ALBANY NO.:  [id number]   MEDICAL RECORD NO.:  [id number]          PATIENT TYPE:  INP   LOCATION:  3032                         FACILITY:  [facility]   PHYSICIAN:  Graylin Shiver, M.D.   DATE OF BIRTH:  02-18-1946   DATE OF PROCEDURE:  10/08/2005  DATE OF DISCHARGE:                                 OPERATIVE REPORT   PROCEDURE:  Colonoscopy.   INDICATION:  Hematochezia, heme-positive stool.   Informed consent was obtained after explanation of the risks of bleeding,  infection, and perforation.   PREMEDICATIONS:  1.  Fentanyl 20 mcg IV.  2.  Versed 2 mg IV.   These doses were given for the colonoscopy.  The patient has already had an  EGD just prior to this exam with additional medications. See EGD report.   PROCEDURE:  With the patient in the left lateral decubitus position, a  rectal exam was performed.  No masses were felt.  The Olympus colonoscope  was inserted into the rectum and advanced around the colon to the cecum.  Cecal landmarks were identified.  The cecum and ascending colon were normal.  The transverse colon normal.  There were a few diverticula noted in the  descending colon.  The sigmoid and rectum looked normal.  She tolerated the  procedure well without complications.   IMPRESSION:  Diverticulosis.  No evidence of active bleeding on this exam.           ______________________________  Graylin Shiver, M.D.     SFG/MEDQ  D:  10/08/2005  T:  10/08/2005  Job:  [id number]   cc:   [facility]

## 2010-09-15 NOTE — H&P (Signed)
NAME:  Allison Washington, Allison Washington NO.:  [id number]   MEDICAL RECORD NO.:  [id number]          PATIENT TYPE:  EMS   LOCATION:  MAJO                         FACILITY:  [facility]   PHYSICIAN:  Lonia Blood, M.D.      DATE OF BIRTH:  1945-10-09   DATE OF ADMISSION:  10/03/2005  DATE OF DISCHARGE:                                HISTORY & PHYSICAL   The patient has two medical records numbers:  [id number] and [id number].   PRIMARY CARE PHYSICIAN:  Dr. Leanord Hawking.   PRESENTING COMPLAINT:  Catatonia and aphasia.   HISTORY OF PRESENT ILLNESS:  The patient is a 65 year old with complicated  medical history who was just discharged a week ago from the hospital after  having what appeared to be another episode of stroke.  At that time, a CT  scan only showed subacute strokes and nothing new.  She was sent back to  [facility].  Today, however, she was brought in by her  daughter who found her not able to talk.  She was completely aphasic and  catatonic.   On arrival in the ED, the patient was found to have hematochezia which is  the first for her.  The patient has slowly improved now and is able to  communicate.  She verbalizes but is not coherent.  She denies any pain.  No  abdominal pain.  No nausea and vomiting and no recent diarrhea.   PAST MEDICAL HISTORY:  1.  Significant for multiple CVAs.  2.  Uncontrolled diabetes with recent hemoglobin A1c 10.1 on Sep 18, 2005.  3.  Chronic dysphagia.  The patient refused modified barium swallow last      admission in May which ran from May 22 to Sep 21, 2005.  4.  History of dyslipidemia.  5.  Hypertension.  6.  Sacral decubitus ulcer.  7.  Peripheral vascular disease status post right AKA.  8.  History of seizure disorder secondary to previous strokes.  9.  History of VDRF.  10. Secondary status epilepticus.  11. 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  12. Acute on chronic renal insufficiency.  13. Previous  anemia of chronic disease.  14. Chronic dysarthria.  15. Prior persistent fevers and leukocytosis.  16. History of funguria.  17. History of metabolic acidosis in the past.  18. History of phenytoin hypersensitivity.   MEDICATIONS:  1.  Currently on Norvasc 10 mg daily.  2.  Oxycodone 10 mg p.o. q.8h.  3.  Detrol 4 mg daily.  4.  Protonix 40 mg daily.  5.  Catapres 5.3 mg q.24h. to be changed every 7 days.  6.  Zocor 40 mg daily.  7.  NovoLog sliding scale 2 units before meals and at bed time.  8.  Aggrenox currently b.i.d.  9.  Tylenol 650 1-2 tablets q.24h.  10. Lantus 35 units subcutaneously q.h.s.  11. Lisinopril 40 mg daily.  12. Labetalol 200 mg b.i.d.  13. Lovenox for DVT prophylaxis.   ALLERGIES:  The patient has no known drug allergies.   SOCIAL HISTORY:  The patient is currently  a resident of [facility].  No history of tobacco or alcohol use.   FAMILY HISTORY:  Noncontributory.   REVIEW OF SYSTEMS:  The patient is virtually aphasic, although denied any  pain.  A 10-point review of systems is mainly per HPI at this point.   PHYSICAL EXAMINATION:  VITAL SIGNS:  Temperature 99.3, blood pressure  130/66, pulse 82, respiratory rate 20, saturations 100% on room air.  GENERAL:  The patient is aphasic, although she is verbalizing.  She is in no  acute distress but mainly withdrawn.  She follows command.  HEENT:  The patient has conjunctivae pallor but no jaundice.  She has  visible left facial droop which is not new.  NECK:  Supple.  No visible JVD and no lymphadenopathy.  RESPIRATORY:  She has good air entry bilaterally.  No wheezes or rales.  CARDIOVASCULAR:  The patient has S1 and S2.  No murmurs.  ABDOMEN:  Obese, soft, nontender.  Positive bowel sounds.  EXTREMITIES:  The patient has right AKA left.  No clubbing, cyanosis, or  edema.   LABORATORY DATA:  Showed a white count of 9.4, hemoglobin is 6.6 with a  platelet count of 286,000.  PT is 14.9, INR 1.2,  PTT 28.  Sodium 138,  potassium 4.0, chloride 105, CO2 22, glucose 100, BUN 45, creatinine 1.3,  calcium 9.4, total protein 7.0.  LFTs essentially normal.  Her UA showed  cloudy urine with small bilirubin, large leukocyte esterase, WBC 11 to 20  and many bacteria.  A head CT without contrast showed no acute intracranial  findings.   ASSESSMENT:  Therefore, this is a 65 year old female with unfortunate  chronic history of multiple medical problems presenting with what appears to  be bright red blood per rectum and anemia.  She also has altered mental  status presumed to be probably another cerebrovascular accident but with  negative head CT so far.  The patient is on multiple medications that could  have led to this.  She is on Aggrenox and Lovenox.  She has a previously  been on aspirin and Plavix apparently but not at this point.  The patient  has no prior gastrointestinal bleed.  The family would want the patient to  be on some blood thinners; however, with this episode, it is likely a huge  decision has to be made regarding the continuation of blood thinners.   PLAN:  1.  Bright red blood per rectum:  We will admit the patient.  We will start      two wide bore IV.  Start IV Protonix.  I will also type and cross match      for four units of packed red blood cells and transfuse two units as a      start.  We will get GI involvement.  We will also hold all      anticoagulants at this point.  I have discussed with the family the fact      that she might be at risk for further CVA while we are stopping these      medications until she is cleared by GI to resume.  The patient may      ultimately be able to resume Aggrenox and maybe the Lovenox is      responsible for her current bleed.  2.  Anemia with blood loss:  We will proceed as indicated above with      transfusion as necessary. 3.  Diabetes:  We  will watch the patient CBGs closely.  It looks like her      sugars are controlled  today.  I will continue with her home dose of      insulin at this point.  4.  Hypertension:  Her blood pressure also seems reasonably controlled.  I      will continue with her Clonidine patch.  All her oral medications at      this point, she may not be able to take them.  So I will hold off for      now.  5.  Possible CVA:  As indicated, the patient's CT is negative for anything      acute.  If she had any other CVAs, chances that workup right now may not      change anything as the treatment anyway is not going to change.  The      patient is not likely to be on anticoagulation right now due to what      happened.  6.  Decubitus ulcers:  We will continue with wound care as previous visit.  7.  Peripheral vascular disease:  Seems to be stable at this point.  8.  Dyslipidemia:  I will hold off on Zocor now until the patient is able to      start taking orally.  9.  Other medical problems seem to be stable and we will handle them      accordingly while the patient is in the hospital.      Lonia Blood, M.D.  Electronically Signed     LG/MEDQ  D:  10/04/2005  T:  10/04/2005  Job:  [id number]

## 2010-09-15 NOTE — Discharge Summary (Signed)
NAME:  Allison Washington, Allison Washington            ACCOUNT NO.:  [id number]   MEDICAL RECORD NO.:  [id number]          PATIENT TYPE:  INP   LOCATION:  5737                         FACILITY:  [facility]   PHYSICIAN:  Jonna L. Robb Matar, M.D.DATE OF BIRTH:  [birth year]   DATE OF ADMISSION:  10/16/2005  DATE OF DISCHARGE:  10/19/2005                                 DISCHARGE SUMMARY   PRIMARY CARE PHYSICIAN:  Maxwell Caul, M.D.   DISCHARGE DIAGNOSES:  1.  Recurrent diverticular bleed.  2.  Multiple cerebrovascular accidents.  3.  Uncontrolled type 2 diabetes.  4.  Chronic dysphagia.  5.  Dyslipidemia.  6.  Hypertension.  7.  Peripheral vascular disease.  8.  Seizure disorder.  9.  Chronic renal insufficiency.  10. Anemia of chronic disease.  11. Dysarthria.   PHYSICAL EXAMINATION:  HEENT:  Arcus senilis, dentures.  EXTREMITIES:  Right AKA, residual hemiparesis.  NEUROLOGY:  Aphasia.   Hemoglobin was 10.4.   HOSPITAL COURSE:  The patient was hydrated and transfused intermittently.  She was evaluated by Fayrene Fearing L. Randa Evens, M.D. for gastroenterology.  He  doubted that repeat colonoscopy would add anything and suggested MiraLax and  transfusions.  The patient had mild hypoglycemic episode, so her Lantus was  cut down.  Hemoglobin got lowest at 7.8.  No bleeding has been seen in the  last 48 hours.   DISCHARGE MEDICATIONS:  1.  Catapres TTS 3 patch once weekly.  2.  Lantus 35 units subcu q.h.s.  3.  Duragesic patch 25 mcg every 3 days.  4.  Lisinopril 40 daily.  5.  Labetalol 200 b.i.d.  6.  Amlodipine 100 daily.  7.  Simvastatin 40 q.h.s.  8.  Protonix 40 daily.  9.  Detrol LA 4 daily.  10. Potassium 40 daily.  11. Tylenol 650 q.4 hours p.r.n.  12. MiraLax 17 grams daily.  13. Colace 100 mg daily.      Jonna L. Robb Matar, M.D.  Electronically Signed     JLB/MEDQ  D:  10/19/2005  T:  10/19/2005  Job:  [id number]   cc:   Maxwell Caul, M.D.  Fax: [phone]

## 2010-09-15 NOTE — H&P (Signed)
NAME:  Allison Washington, Allison Washington NO.:  [id number]   MEDICAL RECORD NO.:  [id number]                   PATIENT TYPE:  INP   LOCATION:  3010                                 FACILITY:  [facility]   PHYSICIAN:  Melvyn Novas, M.D.               DATE OF BIRTH:  20-Jan-1946   DATE OF ADMISSION:  09/25/2002  DATE OF DISCHARGE:                                HISTORY & PHYSICAL   HISTORY OF PRESENT ILLNESS:  This lady is seen here today on 09/25/02 in the  [facility] emergency room where she presented with a history of three days  of left facial pain and left arm pain and cramping.  She has noticed that  she has significant pain with rotatory movement of her head especially  towards the left.  She feels clumsy in her left hand and states that a pain  radiates from her axilla into the elbow and lateral antebrachium, her pulse  is palpable.  She is severely dysarthric, limited by a former stroke to the  left hemisphere subcortical as described in her E-chart.  She has been a  patient of Dr. Domingo Sep at [facility] and has been followed by  Dr. Kelli Hope for the stroke after her last admission to the  hospital.   She also has a history of lower extremity embolism 444.22, stroke 345.19,  anemia 285.1, hypertension 401.9.  Her stroke in 4/03 however affected her  right side with weakness.  The patient now complains about all symptoms  being left-sided.   PAST MEDICAL HISTORY:  Includes further, hypercholesterolemia, insulin-  dependent diabetes mellitus, coagulopathy, and injuries from multiple falls.   MEDICATIONS:  The patient does not know the dose of her medication but she  can name them.  1. Actos for diabetes.  2. Plavix one daily.  3. Aspirin, she believes a full size, one daily.  4. Altocor.  5. Altace.  6. Norvasc.  7. Toprol.  8. Pepcid.   ALLERGIES:  She states she is allergic to LIPITOR.   PHYSICAL EXAMINATION:  VITAL SIGNS: Normal.   Blood pressure is 170/68, heart  rate is 88, respiratory rate is 18 to 20 at rest.  Pulses are peripherally  decreased, I can feel the popliteal pulse in her right lower extremity, I  can feel no popliteal pulse in her left lower extremity but for some reason  can feel a dorsalis pedis pulse there.  GENERAL: She presents as rather a agitated and restless woman looking much  older that her stated age.  HEENT: Her mucous membranes appear well perfused.  She does have facial  asymmetry over the jaw from her previous stroke (she states).  Sclarea are  icteric.  She seems to be very hard of hearing.  LUNGS: Clear to auscultation.  CARDIOVASCULAR: Regular rate and rhythm.  NEUROLOGIC EXAMINATION: Mental status - agitated.  Speaks very loud in  incomplete sentences,  severe dysarthria.  Cranial nerves - pupils react to  light, I cannot see an accommodation reaction of the right pupil.  Her  extraocular movements are scotopic, she is unable to perform a smooth  pursuit in the horizontal plane.  She has no palpable edema. Left facial  structures appear puffy and weaker than on the right.  The tongue and uvula  - midline movements.  Her neck is not supple, she can move it much better to  the right than to the left.  Carotid pulse is not detected on the left.  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  I cannot  elicit a Babinski reflex.  Reflexes are 2+ otherwise.  On the right side I  can elicit an upgoing toe.  The patient is actually able to perform a finger-  to-nose test that is severely dysmetric.  She cannot perform a heel-shin  test.  She refused to be walked, the patient insists that she has to eat  first before any further examination if possible.  Her daughter who is in  the room with her is not willing to take her mother home until a possible  vascular abnormality leading to the facial pain  and arm pain is ruled out.   ASSESSMENT:  1. History of stroke and lower extremity thrombosis and vascular surgery in     7/03.  The patient is presenting now with atypical left facial pain, left     body pain and arm clumsiness - weakness.  Rule out new cerebrovascular     accident.  CT is negative.   LABORATORY DATA:  Studies are pending.   PLAN:  She will be admitted to the stroke service, Dr. Porfirio Mylar Dohmeier for  the weekend.  Her left arm will be evaluated with a Doppler Study, I have  also ordered an MRI and MRA of the neck and a subclavian flow study if able  to obtain over the weekend.  She will be on a regular diet upon her own  insistence.  We will have physical therapy, occupational therapy and ST see  her.                                                Melvyn Novas, M.D.    CD/MEDQ  D:  09/25/2002  T:  09/26/2002  Job:  [id number]   cc:   Ileana Roup, M.D.  [address], [place] [address]  Fax: [phone]   Domingo Sep  [address]  [place]  [place] [address]  Fax: [phone]

## 2010-09-15 NOTE — Discharge Summary (Signed)
[place]. [facility]  Patient:    JARRETT, CHICOINE Visit Number: [id number] MRN: [id number]          Service Type: MED Location: [id number] Attending Physician:  Catalina Pizza Admit Date:  11/02/2001 Disc. Date: 11/07/01   CC:         Caralee Ates, M.D.   Discharge Summary  DISCHARGE DIAGNOSES: 1. Critical right leg ischemia, resolved. 2. Diabetes mellitus, insulin-dependent. 3. Hypertension. 4. Anemia. 5. Status post cerebrovascular accident.  DISCHARGE MEDICATIONS:  1. Metoprolol 60 mg p.o. b.i.d.  2. Hydrochlorothiazide 25 mg p.o. q.d.  3. Aspirin 325 mg p.o. q.d.  4. Lantus insulin 20 units in the evening.  5. Insulin 70/30 20 units in the morning, 20 units in the evening.  6. Norvasc 5 mg q.d.  7. Actos 30 mg q.d.  8. Altace 5 mg q.d.  9. Lovenox 70 mg subcu every 12 hours. 10. Warfarin 7.5 mg q.d., adjust accordingly to the INR. 11. Tylenol p.r.n. for pain.  CONDITION ON DISCHARGE:  Regarding her critical leg ischemia, she is in very good condition with no pain.  She came in with pain at a 10 on the right leg.  FOLLOWUP:  She is scheduled to follow up with cardiovascular surgery, Dr. Elyn Peers, as an outpatient on August 15, at 1:15 p.m.  DISPOSITION:  She is to be discharged today to rehabilitation facility, [facility] [place].  SPECIAL INSTRUCTIONS:  If she has further questions about her medical management, please feel free to call me at [phone].  PROCEDURES: 1. On November 03, 2001, lower extremity arterial evaluation Doppler with ankle    brachial index evaluation.  On the right side, there were no pulses    detectable with Doppler and the ankle brachial index was 0.  On the left    side, the ankle brachial index was 0.52. 2. On November 04, 2001, she underwent aortogram which failed to show any major    thrombotic events in the femoral iliac or popliteal area. 3. On November 06, 2001, she underwent two-dimensional echocardiogram of  the    heart to evaluate for suspected source of embolism which showed no    echocardiographic evidence for cardiac source of embolism.  Overall,    ejection fraction ranged from 65-75%, mild left ventricular hypertrophy    with no regional ventricular motion abnormalities. 4. She underwent radiographic examination of the foot on November 02, 2001, which    did not show any signs of osteomyelitis. 5. On the previous admission, she underwent swallowing study which was    satisfactory swallowing with chin tuck.  CONSULTATION:  Vascular surgery, Dr. Elyn Peers, on November 03, 2001.  The impression was right lower extremity ischemia ongoing for two weeks by history.  The foot is viable.  She is able to move ankle and toes, but clearly profoundly ischemic.  The IV heparin was recommended and followup conservatively for the symptoms.  HISTORY OF PRESENT ILLNESS:  This is a 65 year old, African-American female with poorly-controlled diabetes and hypertension, cerebrovascular disease, status post left CVA in April 2003, with residual right-sided weakness, expressive aphasia and dysphagia with thin liquids who presented with severe progressive right foot pain.  She was empirically treated for possible cellulitis and felt improved.  She came in for increased right leg pain 10/10 in the last 24 hours.  PHYSICAL EXAMINATION:  GENERAL:  Middle-age, well-nourished female in distress secondary to the pain in the right leg.  VITAL SIGNS:  Temperature 99.1,  blood pressure 178/56, heart rate 56, respiratory rate 20, 98% saturations on room air.  HEENT:  PERRLA.  EOMI.  NECK:  Supple.  No carotid bruits.  LUNGS:  Clear to auscultation bilaterally.  HEART:  Regular rate and rhythm with no murmurs or gallops.  ABDOMEN:  Soft, nontender, slightly distended.  Bowel sounds present.  EXTREMITIES:  No thigh or calf tenderness.  +1 ankle edema bilaterally. Pulses not palpable bilaterally.  NEUROLOGIC:  Expressive  aphasia.  Alert and oriented x3.  Moves well with strength, left more than right.  Left lower extremity with +2 pain on mobilization.  Cerebellum intact.  LABORATORY DATA AND X-RAY FINDINGS:  Hemoglobin 10.8.  Sodium 143, potassium 3.6, chloride 108, CO2 31, BUN 16, creatinine 0.8, glucose 78.  Vitamin B12 252.  Ferritin 183, iron 65, TIBC 304.  HOSPITAL COURSE:  #1 - CRITICAL RIGHT LEG ISCHEMIA:  This was evaluated by vascular surgery and she was started on pain medication and intravenous heparin resolving in 24 hours.  Arteriogram failed to show any major changes. She was considered stable from the symptoms point of view and mobility was excellent.  She was starting on Lovenox subcu plus Coumadin p.o.  Please monitor the overanticoagulation with INR every day.  Overlap efficient anticoagulation with Warfarin for at least two days and then just stop the Lovenox and continue on the Coumadin at least until August 15, when she is due to see Dr. Elyn Peers in his office.  #2 - HYPERTENSION:  She is on multiple medical regimens.  She was not totally under control and due to her anticoagulation, we strengthened the blood pressure control, increasing the ACE inhibitor dosage from 10 to 15 and then to 20, adding Norvasc 5 mg every day.  #3 - DIABETES:  This was under poor control as she came in.  Good control was achieved with the current regimen and recommended that she continue this regimen.  #4 - ANEMIA:  The hemoglobin was stable.  She has normocytic anemia with no ferritin, B12 and folate changes.  #5 - RECENT STROKE:  She will continue with aspirin.  #6 - DECONDITIONING:  She needs rehabilitation to help her with the physical therapy, occupational therapy and speech therapy to try and return to previous function.  DISCHARGE LABORATORY DATA AND X-RAY FINDINGS:  White blood cells 6.6,  hemoglobin 11, hematocrit 33.9, thrombocytes 289, MCV 85.7. Attending Physician:  Catalina Pizza DD:   11/07/01 TD:  11/07/01 Job: [id number]

## 2010-09-15 NOTE — H&P (Signed)
NAME:  Allison Washington, Allison Washington NO.:  [id number]   MEDICAL RECORD NO.:  [id number]           PATIENT TYPE:   LOCATION:                                 FACILITY:   PHYSICIAN:  Hillery Aldo, M.D.   DATE OF BIRTH:  [birth year]   DATE OF ADMISSION:  10/16/2005  DATE OF DISCHARGE:                                HISTORY & PHYSICAL   PRIMARY CARE PHYSICIAN:  Maxwell Caul, M.D.   CHIEF COMPLAINT:  GI bleed.   HISTORY OF PRESENT ILLNESS:  The patient is a 65 year old female recently  admitted to the hospital from October 03, 2005, through October 10, 2005, with  hematochezia and stroke.  She is status post upper and lower endoscopy  showing diverticulosis and chronic gastritis. The patient was  hemodynamically stable during that admission.  She was discharged to a  nursing home and presents now with staff having found her with gross  hematochezia described as approximately 1/2 palm-ful.  The patient was,  therefore, transferred to the emergency department for further evaluation  and workup.  The patient denies any abdominal pain, nausea, vomiting.  Level  five caveat applies to her; however, because she is a aphasic and is not a  reliable historian.   PAST MEDICAL HISTORY:  1.  Multiple cerebrovascular accidents  2.  Uncontrolled diabetes.  3.  Chronic dysphagia.  4.  Dyslipidemia.  5.  Hypertension.  6.  Sacral decubitus ulcer.  7.  Peripheral vascular disease status post right AKA  8.  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  9.  History of ventilatory-dependent respiratory failure.  10. History of status epilepticus  11. Non ketotic hyperosmolar coma back in March 2006.  12. History of chronic renal insufficiency.  13. Anemia of chronic disease.  14. Dysarthria.  15. History of funguria.  16. Recent gastritis and diverticulosis status post GI bleed.   FAMILY HISTORY:  Unable to obtain   SOCIAL HISTORY:  The patient is resident of [facility].  Reportedly  there is prior history of tobacco or drug use.  She is a full code.   ALLERGIES:  None.   CURRENT MEDICATIONS:  1.  Lantus 1100 units daily.  2.  Detrol LA 4 mg daily.  3.  Protonix 40 mg daily.  4.  Potassium chloride 20 mEq daily.  5.  Simvastatin 40 mg daily.  6.  Lisinopril 40 mg daily.  7.  Norvasc 10 mg daily.  8.  Labetalol 200 mg b.i.d.  9.  Catapres TTS 0.3 mg q. 24 h  10. Aggrenox 1 capsule b.i.d.   REVIEW OF SYSTEMS:  As per HPI.  The patient denies any dysuria, shortness  of breath, cough, or pain anywhere.   PHYSICAL EXAMINATION:  VITAL SIGNS: Temperature 99.1, pulse 93, respirations  18, blood pressure 129/54, O2 saturation 96% on room air.  GENERAL:  Well-developed, well-nourished female in no acute distress.  HEENT:  Normocephalic, atraumatic.  PERRL.  There is arcus senilis  bilaterally.  Extraocular movements are intact.  Oropharynx is clear.  The  patient has dentures.  NECK:  Supple, no thyromegaly, no lymphadenopathy, no jugular venous  distension.  CHEST:  Lungs clear to auscultation bilaterally with good air movement.  HEART:  Regular rate, rhythm.  No murmurs, rubs, gallops.  ABDOMEN:  Soft, nontender, nondistended with normoactive bowel sounds.  EXTREMITIES:  There is a right AKA.  Left is without clubbing, edema,  cyanosis.  SKIN:  Warm and dry.  No rashes.  NEUROLOGIC:  The patient awakens easily.  She has some residual hemiparesis.  She is aphasic but nods yes or no to questions.   DATA REVIEW:  Sodium is 139, potassium 4, chloride 108, bicarb 25, BUN 12,  creatinine 0.9, glucose 96.  White blood cell count is 8.8, hemoglobin 10.4,  hematocrit 31.2, platelet count 444.  Urinalysis is negative nitrites and  has a small amount of leukocyte esterase.  There are 3-6 white blood cells,  0-2 red blood cells, and rare bacteria.  Specific gravity is 1.018.   ASSESSMENT/PLAN:  1.  Recent gastrointestinal bleed, likely secondary to  diverticulosis:  We      will admit the patient for observation and check serial CBCs q.8 h.  A      GI consultation has been obtained.  Will follow closely and  administer      blood products if needed.  2.  Diabetes:  Will administer sliding scale insulin, Lantus, for glycemic      control.  3.  History of stroke:  Will hold the patient's Aggrenox for now given her      GI bleed.  4.  Prophylaxis:  Will use PAS hose for deep vein thrombosis prophylaxis and      place the patient on proton pump inhibitor IV.      Hillery Aldo, M.D.  Electronically Signed     CR/MEDQ  D:  10/16/2005  T:  10/16/2005  Job:  [id number]   cc:   Maxwell Caul, M.D.  Fax: [phone]

## 2010-09-15 NOTE — Discharge Summary (Signed)
NAME:  Allison Washington, Allison Washington NO.:  [id number]   MEDICAL RECORD NO.:  [id number]                   PATIENT TYPE:  INP   LOCATION:  3010                                 FACILITY:  [facility]   PHYSICIAN:  Deanna Artis. Sharene Skeans, M.D.           DATE OF BIRTH:  Jun 11, 1945   DATE OF ADMISSION:  09/25/2002  DATE OF DISCHARGE:  09/26/2002                                 DISCHARGE SUMMARY   FINAL DIAGNOSES:  1. Dysesthesias left face and hand, unknown etiology, 783.0.  2. Non-insulin-dependent diabetes mellitus.  3. Hypertension.  4. History of hypercholesterolemia.  5. Peripheral vascular disease.  6. Recent deep vein thrombosis.  7. Prior cerebrovascular accident.   PROCEDURES:  1. CT scan of the brain.  2. MRI of the brain.   COMPLICATIONS:  None.   SUMMARY OF THE HOSPITALIZATION:  The patient had onset of two days of  dysesthesias of her left face and hand.  She appeared to be weak to Dr.  Vickey Huger and, therefore, was admitted for evaluation of possible  cerebrovascular accident.  MRI of the brain showed remote infarctions of the  left dorsal rostral thalamus, left subinsular region, left mid pons, and to  a much lesser extent the right mid pons.  There were scattered white matter  lesions bilaterally.  There was evidence of diffuse atrophy, especially of  the cerebellar region.  No acute lesions were seen.  MRA showed no  occlusions.  The patient had narrowed origins of the vertebral arteries  bilaterally, symmetric narrowing for several centimeters, and then normal  diameter.  The posteroinferior cerebellar arteries and the superior  cerebellar arteries were small.  The distal posterior cerebral artery  vessels were attenuated.  Anterior circulation showed atherosclerotic  changes in the intracranial vessels with no occlusion and no significant  stenosis at the origin of the common carotid or internal carotid arteries.   Etiology, therefore, of the  patient's symptoms was unclear.  This morning  the patient was complaining of difficulty getting her breath.  I had her  seen by Dr. Sherin Quarry.  He was unable to determine the etiology of this  condition.  He ordered a chest x-ray, which I have reviewed.  It shows mild  basilar atelectasis, but it is a poor inspiratory film.  There is no  evidence of heart failure.  No lesions in the lung fields, and no  infiltrates.   Beta natriuretic peptide was normal at 88.4.  TSH is pending.  Lipid panel  was ordered but cannot be done until the morning.  The patient wants to go  home now.  Similarly, we cannot do a 2-D echocardiogram, which Dr. Tresa Endo  suggested be done as an outpatient.  The patient's O2 saturations were in  the 95-98% range today on room air.  Capillary glucoses from 134-157,  currently 137.  He also ordered Humulin 20 units of 70/30.  It does not seem  reasonable to do that at this time with the patient's glucose as it is.   The patient vomited once tonight.  She had been well all day, but I believe  it is related to a problem with getting morphine without having antiemetic.  I thought that the patient was just receiving a single dose of morphine, but  it was ordered as a p.r.n.   DISCHARGE MEDICATIONS:  The patient will be discharged on the following  medications:  1. Baclofen 20 mg at bedtime.  2. Actos 30 mg per day.  3. Plavix 75 mg per day.  4. Ecotrin 325 mg per day.  5. Protonix 40 mg per day.  6. Metoprolol XL 25 mg per day.  7. Mevacor 20-mg tablets, 2 in the evening.  8. Altace 5 mg per day.  9. Detrol 4 mg in the evening.  10.      Norvasc 5 mg per day.  11.      Heparin was discontinued.  The patient has had no adverse problems     from that.   We will discontinue the Foley catheter.  Discontinue the patient's IV  fluids.  Discontinue morphine.  I had planned to put the patient on  Neurontin, but she did not wish to have other medications.   The  patient desired to go home before we were ready to discharge;  nonetheless, I do not feel that she is a danger to herself and have,  accordingly, discharged the patient.  Other laboratories from this  hospitalization include CPK total 276, CK-MB 11.5, troponin I less than 0.1.  This was repeated.  CK total 238, CK-MB 6.4, relative index 2.7, troponin I  less than 0.1.  TSH is pending at this time.  White count 6100, hemoglobin  11.4, hematocrit 33.9, MCV 85.1, platelet count 233,000.  Homocystine 13.08.  Cholesterol 225, triglycerides 164, HDL cholesterol 47, VLDL 33, LDL 145.  The patient is going to need to have increase of her statin medications in  order to adjust her LDL cholesterol below 100 (current recommendations for a  person with stroke and/or heart disease is to have it at 70 or below).  Serum homocystine is not elevated.  The patient does not need to be on Foltx  (13.08).  The patient's urine drug screen was positive for opiates.  She was  not taking opiates as an outpatient.  I do not know if she received that  before she had her urine test.   She is discharged in the same condition as admitted, but she does not have  evidence of stroke and I do not know the etiology of her dysfunction.                                               Deanna Artis. Sharene Skeans, M.D.    [facility]  D:  09/26/2002  T:  09/27/2002  Job:  [id number]   cc:   Joycelyn Rua, M.D.  [address]  [place] [address]  Fax: [phone]   Sherin Quarry, MD

## 2010-09-15 NOTE — Consult Note (Signed)
NAME:  Allison Washington, Allison Washington NO.:  [id number]   MEDICAL RECORD NO.:  [id number]          PATIENT TYPE:  EMS   LOCATION:  MAJO                         FACILITY:  [facility]   PHYSICIAN:  James L. Malon Kindle., M.D.DATE OF BIRTH:  11-Jun-1945   DATE OF CONSULTATION:  DATE OF DISCHARGE:                                   CONSULTATION   CONSULTING PHYSICIAN:  James L. Randa Evens, M.D.   REQUESTING PHYSICIAN:  [facility] to be admitted to the [facility].   PRIMARY CARE DOCTOR:  Maxwell Caul, M.D.   The patient is a resident __________  hematochezia today.  She was able to  turn her head yes or no.  She denies any constipation or abdominal pain.  She was hospitalized on October 03, 2005 to October 10, 2005, with multiple medical  problems which included hematochezia.  At that time she underwent an  endoscopy, colonoscopy by Dr. Herbert Moors with a finding of gastritis in the  stomach and diverticulosis.  It is unclear how the patient has done for the  past week but apparently the story we got is her bleeding just started  today.   MEDICATIONS:  Catapres, Lantus insulin 35 units nightly, Duragesic patch,  sliding scale insulin, Lisinopril, labetalol, __________.   ALLERGIES:  This patient does not have any allergies to medications  __________.   MEDICAL HISTORY:  1.  Diabetes.  2.  Peripheral vascular disease.      1.  Right AKA.  __________  .  1.  History ventilator-dependent respiratory failure.  __________  .  2.  Stroke in the past as well as a recent stroke.  3.  The past admission in June __________  dysarthria and dysphagia.  She is      on a dysphagia-2 diet, has had panda tubes in the past.  4.  History of acute renal failure that resolved __________  chronic renal      insufficiency. Creatinine is around 2.  5.  Seizure disorder thought to be secondary to previous strokes.  6.  History of __________  in the past.  7.  Hypertension.   SOCIAL  HISTORY:  The patient lives in a skilled nursing home.  I am not sure  which one she is a resident of at this time.   PHYSICAL EXAMINATION:  VITAL SIGNS:  Blood pressure is 110/__________  pulse  is 95-100, __________  GENERAL:  __________  unable to speak.  EYES:  Anicteric.  NOSE:  Clear.  HEART: Regular rate and rhythm with no gallops.  ABDOMEN:  Soft and nontender with positive bowel sounds.   ASSESSMENT:  1.  Lower gastrointestinal bleed, probably __________ in origin.  She just      had a recent colonoscopy a couple of weeks ago with no other findings.  2.  __________  as above.   PLAN:  We will __________ daily, clear liquids and support blood  transfusions if she continues to bleed .  We will need to consider GI  __________.           ______________________________  James L. Malon Kindle., M.D.     Waldron Session  D:  10/16/2005  T:  10/16/2005  Job:  [id number]   cc:   Maxwell Caul, M.D.  Fax: [phone]

## 2010-09-15 NOTE — Op Note (Signed)
Allison Washington, Allison Washington NO.:  [id number]   MEDICAL RECORD NO.:  [id number]                   PATIENT TYPE:  INP   LOCATION:  2012                                 FACILITY:  [facility]   PHYSICIAN:  Caralee Ates, M.D.                  DATE OF BIRTH:  Jul 08, 1945   DATE OF PROCEDURE:  11/21/2001  DATE OF DISCHARGE:  11/26/2001                                 OPERATIVE REPORT   PREOPERATIVE DIAGNOSIS:  Right lower extremity ischemia.   POSTOPERATIVE DIAGNOSIS:  Right lower extremity ischemia.   PROCEDURES:  1. Right femoral profundaplasty with vein patch angioplasty.  2. Right femoral to anterior tibial artery bypass graft with a composite     right and left thigh greater saphenous vein.   SURGEON:  Caralee Ates, M.D.   ASSISTANT:  Quita Skye. Hart Rochester, M.D., and Eber Hong, P.A.   ANESTHESIA:  General endotracheal.   ESTIMATED BLOOD LOSS:  200 cc.   DRAINS:  None.   SPECIMENS:  None.   COMPLICATIONS:  None.   HISTORY:  This is a 65 year old black female who had recently suffered a  left hemispheric stroke with some residual hemiparesis on the right side,  however had good movement and ability to transfer on the right leg, who was  seen initially with pain in the right foot in the end of June with an ankle  brachial index of 0.2, and ultimately was discharged home, returned back to  the hospital with worsening of her pain and a cool, mottled foot two weeks  later.  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  She was felt to be at  high risk for limb loss without some form of surgical intervention;  therefore, a revascularization via a femoral-anterior tibial bypass as well  as profundaplasty was felt to be her best option for limb salvage.  The  risks, benefits, and alternatives to this  procedure were explained in detail  to the patient, and she agreed to proceed.   DESCRIPTION OF PROCEDURE:  The patient was brought into the operating room  and placed on the operating table in supine position.  Following adequate  general endotracheal anesthesia, the lower extremities were draped and  prepped circumferentially from the waist to the feet.  A vertical incision  was made in the right groin and carried level of the common femoral artery.  The common femoral artery was identified and mobilized and encircled with an  umbilical tape proximally.  The SFA and profunda were also mobilized and  encircled with vessel loops.  The profunda was exposed down to its first  major branch several centimeters from its origin.  Each branch was  controlled individually with vessel loops as well.  The greater saphenous  veins on both legs were exposed from the groin to the knee, leaving  intermittent skin bridges.  The use of both greater saphenous veins was  indicated due to the fact that she had only short segments of usable conduit  between the knee and the thigh on both lower extremities to provide adequate  length for the femoral-anterior tibial revascularization.  Once these veins  were exposed, the side branches were ligated with 4-0 silk ties and divided.  The veins were then placed on the back table.  Next an incision was made on  the lateral aspect of the right lower extremity and the anterior  tibial  artery was exposed.  The artery was felt to be somewhat small; however, it  was not particularly calcific.  The artery was mobilized and encircled with  vessel loops proximally and distally.  Next the [place] tunneler was used to  create a lateral subcutaneous tunnel from the lower incision to the groin  incision.  The patient was then systemically heparinized.  During this time  the vein was brought back into the operative field.  It was spatulated and  sewn together in end-to-end  fashion with running 7-0 Prolene suture.  The  vein was then once again placed on the back table in a heparin-saline  solution.  Once an adequate circulation time had been achieved during the  splicing of the vein, the common femoral, SFA, and profunda branches were  occluded with vessel loops.  A longitudinal arteriotomy was performed on the  profunda beginning at its first branch up to its origin.  There was high-  grade shelf-like stenosis at the origin of the profunda. The profunda was  endarterectomized.  A short segment of previously-harvested greater  saphenous vein was then used as a patch angioplasty and sewn into position  with running 6-0 Prolene suture.  Once this had been completed, attention  was turned to the creation of the anterior tibial bypass.  The spliced vein  graft was then brought onto the operative field and its proximal end was  spatulated slightly.  A longitudinal arteriotomy was performed on the common  femoral artery.  The vein graft was then sewn into position in an end-to-  side fashion with running 6-0 Prolene suture.  The graft was positioned in a  nonreversed fashion.  Next the loops around the common femoral, SFA, and  profunda were released, and flow was restored via the profunda.  The vein  graft was then extended to its full length and marked on its anterior  surface, taking care not to twist the vein.  A [place] valvulotome was then  used to lyse all valves.  There was excellent flow through the graft.  The  graft was then attached to the introducer of the [place] tunneler and passed  in the subcutaneous tunnel.  Next the loops around the anterior tibial  artery were tightened to occlude flow.  A longitudinal arteriotomy was  performed on the anterior tibial artery.  The vein graft was then trimmed to  the appropriate length, spatulated, and sewn in position with a running 7-0 Prolene suture.  Prior to completion of the anastomosis, the vein was  flushed  and the anterior tibial artery was backbled.  The anastomosis was  then completed and flow was restored.  There was excellent Doppler flow at  the level of the foot at this point.  A completion arteriogram was  performed, which demonstrated good flow throughout the vein graft and  through the anterior tibial artery down to foot.  With this completed, the  heparin was reversed with 30 mg of protamine and then the wounds were  irrigated with warm sterile saline and all wounds were closed in layers with  2-0, 3-0, and 4-0 Vicryl suture.  Sterile dry dressings were applied.  The  patient was awakened from anesthesia and transferred to the recovery room in  stable condition.  The patient tolerated the procedure well.  There were no  complications.  All needle and sponge counts were correct.                                               Caralee Ates, M.D.    JWY/MEDQ  D:  12/12/2001  T:  12/14/2001  Job:  [id number]   cc:   Ileana Roup, M.D.

## 2010-09-15 NOTE — Discharge Summary (Signed)
NAME:  Allison Washington, Allison Washington NO.:  [id number]   MEDICAL RECORD NO.:  [id number]                   PATIENT TYPE:  INP   LOCATION:  3027                                 FACILITY:  [facility]   PHYSICIAN:  Deirdre Peer. Polite, M.D.              DATE OF BIRTH:  1945/10/26   DATE OF ADMISSION:  10/09/2003  DATE OF DISCHARGE:                                 DISCHARGE SUMMARY   DISCHARGE DIAGNOSES:  1. Hypertensive urgency, resolved, blood pressure controlled at discharge.  2. 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 Please note CT scan and MRI negative for     acute blindness.  3. Hypertension.  4. Diabetes.  5. Dyslipidemia.  6. Medical noncompliance.  7. Questionable blood per rectum with stable hemoglobin and hematocrit and     negative hemoccult per my examination.   DISCHARGE MEDICATIONS:  1. Mevacor 40 mg one daily.  2. Actos 30 mg daily.  3. Insulin 70/30 15 units b.i.d.  4. Toprol-XL 100 mg daily.  5. Glyburide 5 mg one p.o. b.i.d.  6. Altace 10 mg daily.  7. Plavix 75 mg daily.  8. K-Dur 20 mEq daily.  9. Restoril 15 mg one p.o. at night p.r.n.  Please note the patient has     prescription written for five tablets.  10.      Aspirin 325 mg one p.o. daily.   CONSULTANTS:  Neurology, Dr. Vickey Huger.   STUDIES:  1. CT scan of the head:  Impression:  Motion degraded examination     demonstrates several old infarcts superimposed upon atrophy, small vessel     disease type changes without CT evidence of hyperacute large infarct or     evidence of intracranial hemorrhage.  2. MRI of the brain.  Scan is negative for acute or subacute infarction.     Showed chronic atrophy and small vessel ischemic changes involving     multiple sites, particularly brain stem and basal ganglia, particularly     given the patient's age, marked atrophic changes of the cerebellum and     brain stem for the patient's  age.  No findings to suggest acute posterior     reversible encephalopathic changes.  3. CBC within normal limits.  BMET within normal limits.  4. Hemoccult negative.   DISPOSITION:  The patient will be discharged to home.  The patient has an  appointment to follow up with his primary M.D. in approximately one week.  Also the patient has been given information to follow up with [facility]  for assistance with medication.  The patient will be provided with some  medications at the time of discharge by case management to bridge her until  she can follow up at [facility].   HISTORY OF PRESENT ILLNESS:  The patient is a 65 year old black female with  above medical problems who presented to the  ED with complaints of left  facial pain and leg weakness.  The patient initially was seen by neurology  on presentation to the ED.  Dr. Vickey Huger was familiar with the patient's  case and has stated CT scan was negative and BP was significant for markedly  elevated blood pressure at 219/109.  With the patient's evident negative CT  scan and exam consistent with prior evaluation, neurology requested that  [facility] admit the patient for further evaluation and treatment.   PAST MEDICAL HISTORY:  As stated above.   MEDICATIONS ON ADMISSION:  Protonix, potassium, Actos, Altace, Metoprolol,  Plavix, aspirin, hydrochlorothiazide, Norvasc, and Mevacor.   SOCIAL HISTORY:  The patient lives with her daughter.  No smoking and no  tobacco.   FAMILY HISTORY:  Noncontributory.   HOSPITAL COURSE:  Problem 1.  The patient was admitted to a medicine floor  bed for evaluation and treatment of hypertensive urgency.  The patient was  started with IV medicines and ultimately converted to p.o. medication with  excellent blood pressure control.  Blood pressure at discharge was 136/82.  This patient had CT/MRI results as stated above.  No acute findings.  It was  felt that the patient's hypertensive  emergency had also been related to  medication noncompliance.  The patient states that she is just unable to  afford her medication.  The patient has been seen by case manager during  this hospitalization.  She has been referred for [facility] and will be  provided with some medications to bridge her until followup at [facility]  At this time, from the standpoint of hypertension, the patient is stable for  discharge.  Again please note there is no acute CVA and no further  recommendations for neurology.   Problem 2.  Diabetes.  Poorly controlled on outpatient basis as hemoglobin  A1C is 10.6.  The need for compliance has been expressed to the patient.  The patient will be provided prescriptions for Actos, glyburide and insulin.   Problem 3.  History of CVA with associated dysarthria.  At baseline, the  patient has history of spasticity on the right and left-sided weakness  according to her neurologic evaluation by Dr. Vickey Huger.  No new  recommendations at this time.   Problem 4.  Medical noncompliance.  As stated in #1, the patient basically  has problems paying for her medications; therefore, she has been seen by  case management and will be given referral to follow up at [facility].   Problem 5.  Report of blood per rectum during bowel movement.  The patient  stated that she noticed some red blood on the outside of her stool.  The  patient's H&H is stable.  I performed a rectal exam which did show some  external hemorrhoids.  Hemoccult was done which came back negative.  Followup hemoglobin the following day was 13.6,  improved from 12.6.  No further recommendations at this time as the patient  and her daughter are very adamant about leaving the hospital.  I expressed  the need to them about followup on an outpatient basis.   At this time the patient is medically safe for discharge.                                               Deirdre Peer. Polite, M.D.    RDP/MEDQ  D:  10/14/2003  T:  10/16/2003  Job:  [id number]   cc:   Joycelyn Rua, M.D.  [address] [place]  [place] [address]  Fax: [phone]

## 2011-01-22 LAB — DIFFERENTIAL
Basophils Absolute: 0
Basophils Absolute: 0.1
Basophils Relative: 0
Eosinophils Absolute: 0.1
Eosinophils Relative: 2
Eosinophils Relative: 2
Lymphocytes Relative: 27
Lymphocytes Relative: 33
Lymphs Abs: 2.3
Monocytes Absolute: 0.6
Monocytes Absolute: 0.6
Monocytes Relative: 8
Monocytes Relative: 8
Neutro Abs: 3.9
Neutro Abs: 4.9
Neutrophils Relative %: 57

## 2011-01-22 LAB — POCT CARDIAC MARKERS
CKMB, poc: 3.2
Myoglobin, poc: 116
Operator id: 277751
Troponin i, poc: <0.05

## 2011-01-22 LAB — PROTIME-INR: Prothrombin Time: 12.7

## 2011-01-22 LAB — URINALYSIS, ROUTINE W REFLEX MICROSCOPIC
Bilirubin Urine: NEGATIVE
Glucose, UA: NEGATIVE
Hgb urine dipstick: NEGATIVE
Ketones, ur: NEGATIVE
Leukocytes, UA: NEGATIVE
Nitrite: NEGATIVE
Protein, ur: 100 — AB
Specific Gravity, Urine: 1.013
Urobilinogen, UA: 1
pH: 7

## 2011-01-22 LAB — TROPONIN I: Troponin I: 0.01

## 2011-01-22 LAB — CBC
HCT: 36.3
Hemoglobin: 11.9 — ABNORMAL LOW
MCHC: 33.5
MCV: 84.4
Platelets: 294
RDW: 14.7
WBC: 7.9

## 2011-01-22 LAB — COMPREHENSIVE METABOLIC PANEL
AST: 23
Albumin: 4.3
Alkaline Phosphatase: 79
BUN: 19
Chloride: 102
Creatinine, Ser: 0.89
GFR calc Af Amer: >60
Potassium: 4
Total Bilirubin: 0.3
Total Protein: 7.9

## 2011-01-22 LAB — POCT I-STAT, CHEM 8
Glucose, Bld: 130 — ABNORMAL HIGH
HCT: 34 — ABNORMAL LOW
Hemoglobin: 11.6 — ABNORMAL LOW
Potassium: 3.9
Sodium: 141

## 2011-01-22 LAB — APTT: aPTT: 31

## 2011-01-22 LAB — CK TOTAL AND CKMB (NOT AT ARMC)
CK, MB: 6.4 — ABNORMAL HIGH
Relative Index: 2.1

## 2011-01-25 LAB — URINALYSIS, ROUTINE W REFLEX MICROSCOPIC
Ketones, ur: NEGATIVE
Nitrite: NEGATIVE
Protein, ur: NEGATIVE
Urobilinogen, UA: 1

## 2011-01-25 LAB — CBC
Hemoglobin: 10.9 — ABNORMAL LOW
MCHC: 33.2
RDW: 15.5

## 2011-01-25 LAB — DIFFERENTIAL
Basophils Absolute: 0.1
Basophils Relative: 1
Eosinophils Relative: 1
Monocytes Absolute: 0.8
Neutro Abs: 7.2

## 2011-01-25 LAB — POCT I-STAT, CHEM 8
Calcium, Ion: 1.31
Chloride: 110
HCT: 34 — ABNORMAL LOW
TCO2: 23

## 2011-01-25 LAB — POCT CARDIAC MARKERS: Troponin i, poc: <0.05

## 2011-01-26 LAB — GLUCOSE, CAPILLARY
Glucose-Capillary: 101 — ABNORMAL HIGH
Glucose-Capillary: 107 — ABNORMAL HIGH
Glucose-Capillary: 121 — ABNORMAL HIGH
Glucose-Capillary: 140 — ABNORMAL HIGH
Glucose-Capillary: 147 — ABNORMAL HIGH
Glucose-Capillary: 157 — ABNORMAL HIGH
Glucose-Capillary: 161 — ABNORMAL HIGH
Glucose-Capillary: 185 — ABNORMAL HIGH
Glucose-Capillary: 190 — ABNORMAL HIGH
Glucose-Capillary: 191 — ABNORMAL HIGH
Glucose-Capillary: 193 — ABNORMAL HIGH
Glucose-Capillary: 213 — ABNORMAL HIGH
Glucose-Capillary: 220 — ABNORMAL HIGH
Glucose-Capillary: 222 — ABNORMAL HIGH
Glucose-Capillary: 245 — ABNORMAL HIGH
Glucose-Capillary: 247 — ABNORMAL HIGH
Glucose-Capillary: 260 — ABNORMAL HIGH
Glucose-Capillary: 272 — ABNORMAL HIGH
Glucose-Capillary: 348 — ABNORMAL HIGH
Glucose-Capillary: 82

## 2011-01-26 LAB — BASIC METABOLIC PANEL
BUN: 22
BUN: 23
BUN: 41 — ABNORMAL HIGH
CO2: 24
CO2: 25
CO2: 26
CO2: 26
Calcium: 10.1
Calcium: 9.8
Chloride: 105
Chloride: 106
Chloride: 109
Chloride: 109
Chloride: 109
Creatinine, Ser: 1.11
Creatinine, Ser: 1.19
Creatinine, Ser: 2.25 — ABNORMAL HIGH
Creatinine, Ser: 2.45 — ABNORMAL HIGH
GFR calc Af Amer: 24 — ABNORMAL LOW
GFR calc Af Amer: 27 — ABNORMAL LOW
GFR calc Af Amer: 46 — ABNORMAL LOW
GFR calc Af Amer: 55 — ABNORMAL LOW
GFR calc Af Amer: 56 — ABNORMAL LOW
GFR calc non Af Amer: 20 — ABNORMAL LOW
Glucose, Bld: 156 — ABNORMAL HIGH
Potassium: 4
Potassium: 4.3
Potassium: 4.3
Sodium: 142

## 2011-01-26 LAB — CBC
HCT: 31.4 — ABNORMAL LOW
Hemoglobin: 10.6 — ABNORMAL LOW
MCHC: 33.5
MCV: 86.4
MCV: 86.5
RBC: 3.63 — ABNORMAL LOW
RBC: 3.64 — ABNORMAL LOW
WBC: 7.7
WBC: 9.2

## 2011-01-26 LAB — PTH, INTACT AND CALCIUM: Calcium, Total (PTH): 9.4

## 2011-01-26 LAB — TSH: TSH: 5.305 — ABNORMAL HIGH

## 2011-01-26 LAB — SEDIMENTATION RATE: Sed Rate: 30 — ABNORMAL HIGH

## 2011-01-26 LAB — WOUND CULTURE: Gram Stain: NONE SEEN

## 2011-01-26 LAB — IRON AND TIBC
Iron: 56
Saturation Ratios: 17 — ABNORMAL LOW
UIBC: 276

## 2011-01-26 LAB — HEMOGLOBIN A1C: Hgb A1c MFr Bld: 8.1 — ABNORMAL HIGH

## 2011-01-26 LAB — VITAMIN D 25 HYDROXY (VIT D DEFICIENCY, FRACTURES): Vit D, 25-Hydroxy: 36 (ref 30–89)

## 2011-01-26 LAB — PHOSPHORUS: Phosphorus: 3.6

## 2011-02-02 LAB — CBC
HCT: 31.1 — ABNORMAL LOW
Hemoglobin: 10.9 — ABNORMAL LOW
MCV: 84.9
Platelets: 363
RBC: 3.76 — ABNORMAL LOW
RBC: 3.88
RDW: 12.6
WBC: 7.1
WBC: 8.6

## 2011-02-02 LAB — TROPONIN I
Troponin I: 0.02
Troponin I: 0.03

## 2011-02-02 LAB — COMPREHENSIVE METABOLIC PANEL
ALT: 19
AST: 21
Alkaline Phosphatase: 60
CO2: 22
Chloride: 104
GFR calc Af Amer: 13 — ABNORMAL LOW
GFR calc non Af Amer: 11 — ABNORMAL LOW
Glucose, Bld: 240 — ABNORMAL HIGH
Potassium: 6.2 — ABNORMAL HIGH
Sodium: 134 — ABNORMAL LOW
Total Bilirubin: 0.8

## 2011-02-02 LAB — CULTURE, BLOOD (ROUTINE X 2): Culture: NO GROWTH

## 2011-02-02 LAB — BASIC METABOLIC PANEL
BUN: 14
BUN: 21
CO2: 24
Calcium: 9.4
Calcium: 9.5
Chloride: 110
Creatinine, Ser: 1.33 — ABNORMAL HIGH
Creatinine, Ser: 2.65 — ABNORMAL HIGH
GFR calc Af Amer: 22 — ABNORMAL LOW
GFR calc Af Amer: 49 — ABNORMAL LOW
GFR calc non Af Amer: 18 — ABNORMAL LOW
Glucose, Bld: 145 — ABNORMAL HIGH
Glucose, Bld: 84
Potassium: 4.1

## 2011-02-02 LAB — DIFFERENTIAL
Basophils Absolute: 0.1
Basophils Relative: 1
Eosinophils Absolute: 0.2
Eosinophils Absolute: 0.2
Eosinophils Relative: 2
Eosinophils Relative: 3
Lymphs Abs: 1.7
Lymphs Abs: 2.8
Neutrophils Relative %: 63

## 2011-02-02 LAB — CK TOTAL AND CKMB (NOT AT ARMC)
CK, MB: 4.4 — ABNORMAL HIGH
Total CK: 264 — ABNORMAL HIGH

## 2011-02-02 LAB — URINALYSIS, ROUTINE W REFLEX MICROSCOPIC
Glucose, UA: NEGATIVE
Ketones, ur: NEGATIVE
Leukocytes, UA: NEGATIVE
Protein, ur: 30 — AB
Urobilinogen, UA: 0.2

## 2011-02-02 LAB — HEMOGLOBIN A1C
Hgb A1c MFr Bld: 9.6 — ABNORMAL HIGH
Mean Plasma Glucose: 264

## 2011-02-02 LAB — URINE MICROSCOPIC-ADD ON

## 2011-02-02 LAB — LIPID PANEL: Cholesterol: 187

## 2011-02-02 LAB — LIPASE, BLOOD: Lipase: 35

## 2011-02-02 LAB — TSH: TSH: 1.52

## 2011-02-02 LAB — B-NATRIURETIC PEPTIDE (CONVERTED LAB): Pro B Natriuretic peptide (BNP): <30

## 2011-02-02 LAB — POTASSIUM: Potassium: 5.5 — ABNORMAL HIGH

## 2011-02-07 LAB — I-STAT 8, (EC8 V) (CONVERTED LAB)
BUN: 43 — ABNORMAL HIGH
Chloride: 108
Glucose, Bld: 291 — ABNORMAL HIGH
HCT: 36
Hemoglobin: 12.2
Operator id: 291361
Sodium: 138

## 2011-02-07 LAB — DIFFERENTIAL
Basophils Absolute: 0
Basophils Relative: 0
Neutro Abs: 4.5
Neutrophils Relative %: 61

## 2011-02-07 LAB — CBC
MCHC: 32.6
Platelets: 350
RDW: 13.2

## 2011-02-08 ENCOUNTER — Other Ambulatory Visit: Payer: Self-pay | Admitting: Emergency Medicine

## 2011-02-08 ENCOUNTER — Emergency Department (HOSPITAL_COMMUNITY): Payer: Medicare Other

## 2011-02-08 ENCOUNTER — Emergency Department (HOSPITAL_COMMUNITY)
Admission: EM | Admit: 2011-02-08 | Discharge: 2011-02-09 | Disposition: A | Discharge status: Home / Self Care | Payer: Medicare Other | Attending: Emergency Medicine | Admitting: Emergency Medicine

## 2011-02-08 DIAGNOSIS — Z8679 Personal history of other diseases of the circulatory system: Secondary | ICD-10-CM | POA: Insufficient documentation

## 2011-02-08 DIAGNOSIS — N390 Urinary tract infection, site not specified: Secondary | ICD-10-CM | POA: Insufficient documentation

## 2011-02-08 DIAGNOSIS — F0390 Unspecified dementia without behavioral disturbance: Secondary | ICD-10-CM | POA: Insufficient documentation

## 2011-02-08 DIAGNOSIS — E039 Hypothyroidism, unspecified: Secondary | ICD-10-CM | POA: Insufficient documentation

## 2011-02-08 DIAGNOSIS — I2510 Atherosclerotic heart disease of native coronary artery without angina pectoris: Secondary | ICD-10-CM | POA: Insufficient documentation

## 2011-02-08 DIAGNOSIS — R1011 Right upper quadrant pain: Secondary | ICD-10-CM | POA: Insufficient documentation

## 2011-02-08 LAB — COMPREHENSIVE METABOLIC PANEL
ALT: 12 U/L (ref 0–35)
Alkaline Phosphatase: 93 U/L (ref 39–117)
CO2: 31 mEq/L (ref 19–32)
Chloride: 99 mEq/L (ref 96–112)
GFR calc Af Amer: 66 mL/min — ABNORMAL LOW (ref >90)
Glucose, Bld: 146 mg/dL — ABNORMAL HIGH (ref 70–99)
Potassium: 3 mEq/L — ABNORMAL LOW (ref 3.5–5.1)
Sodium: 139 mEq/L (ref 135–145)
Total Bilirubin: 0.1 mg/dL — ABNORMAL LOW (ref 0.3–1.2)
Total Protein: 7.5 g/dL (ref 6.0–8.3)

## 2011-02-08 LAB — URINE MICROSCOPIC-ADD ON

## 2011-02-08 LAB — URINALYSIS, ROUTINE W REFLEX MICROSCOPIC
Bilirubin Urine: NEGATIVE
Specific Gravity, Urine: 1.023 (ref 1.005–1.030)
Urobilinogen, UA: 0.2 mg/dL (ref 0.0–1.0)

## 2011-02-08 LAB — CBC
Hemoglobin: 12.5 g/dL (ref 12.0–15.0)
Platelets: 257 10*3/uL (ref 150–400)
RBC: 4.58 MIL/uL (ref 3.87–5.11)
WBC: 7.7 10*3/uL (ref 4.0–10.5)

## 2011-02-08 LAB — DIFFERENTIAL
Eosinophils Absolute: 0.2 10*3/uL (ref 0.0–0.7)
Lymphocytes Relative: 43 % (ref 12–46)
Lymphs Abs: 3.3 10*3/uL (ref 0.7–4.0)
Neutro Abs: 3.5 10*3/uL (ref 1.7–7.7)
Neutrophils Relative %: 46 % (ref 43–77)

## 2011-02-08 MED ORDER — IOHEXOL 300 MG/ML  SOLN
100.0000 mL | Freq: Once | INTRAMUSCULAR | Status: AC | PRN
  Administered 2011-02-08: 100 mL via INTRAVENOUS

## 2011-03-06 ENCOUNTER — Inpatient Hospital Stay (HOSPITAL_COMMUNITY)
Admission: EM | Admit: 2011-03-06 | Discharge: 2011-03-08 | DRG: 392 | Disposition: A | Discharge status: Skilled Nursing Facility | Payer: Medicare Other | Source: Ambulatory Visit | Attending: Internal Medicine | Admitting: Internal Medicine

## 2011-03-06 ENCOUNTER — Emergency Department (HOSPITAL_COMMUNITY): Payer: Medicare Other

## 2011-03-06 ENCOUNTER — Inpatient Hospital Stay (HOSPITAL_COMMUNITY): Payer: Medicare Other

## 2011-03-06 DIAGNOSIS — E876 Hypokalemia: Secondary | ICD-10-CM

## 2011-03-06 DIAGNOSIS — Z8614 Personal history of Methicillin resistant Staphylococcus aureus infection: Secondary | ICD-10-CM

## 2011-03-06 DIAGNOSIS — Z86718 Personal history of other venous thrombosis and embolism: Secondary | ICD-10-CM

## 2011-03-06 DIAGNOSIS — E1149 Type 2 diabetes mellitus with other diabetic neurological complication: Secondary | ICD-10-CM | POA: Diagnosis present

## 2011-03-06 DIAGNOSIS — Z89619 Acquired absence of unspecified leg above knee: Secondary | ICD-10-CM

## 2011-03-06 DIAGNOSIS — Z794 Long term (current) use of insulin: Secondary | ICD-10-CM

## 2011-03-06 DIAGNOSIS — I639 Cerebral infarction, unspecified: Secondary | ICD-10-CM

## 2011-03-06 DIAGNOSIS — A4902 Methicillin resistant Staphylococcus aureus infection, unspecified site: Secondary | ICD-10-CM

## 2011-03-06 DIAGNOSIS — I69920 Aphasia following unspecified cerebrovascular disease: Secondary | ICD-10-CM

## 2011-03-06 DIAGNOSIS — Z79899 Other long term (current) drug therapy: Secondary | ICD-10-CM

## 2011-03-06 DIAGNOSIS — R509 Fever, unspecified: Secondary | ICD-10-CM | POA: Diagnosis present

## 2011-03-06 DIAGNOSIS — R748 Abnormal levels of other serum enzymes: Secondary | ICD-10-CM | POA: Diagnosis not present

## 2011-03-06 DIAGNOSIS — R1115 Cyclical vomiting syndrome unrelated to migraine: Principal | ICD-10-CM | POA: Diagnosis present

## 2011-03-06 DIAGNOSIS — I160 Hypertensive urgency: Secondary | ICD-10-CM | POA: Diagnosis present

## 2011-03-06 DIAGNOSIS — E669 Obesity, unspecified: Secondary | ICD-10-CM | POA: Diagnosis present

## 2011-03-06 DIAGNOSIS — S78119A Complete traumatic amputation at level between unspecified hip and knee, initial encounter: Secondary | ICD-10-CM

## 2011-03-06 DIAGNOSIS — I498 Other specified cardiac arrhythmias: Secondary | ICD-10-CM | POA: Diagnosis present

## 2011-03-06 DIAGNOSIS — Z7982 Long term (current) use of aspirin: Secondary | ICD-10-CM

## 2011-03-06 DIAGNOSIS — I69959 Hemiplegia and hemiparesis following unspecified cerebrovascular disease affecting unspecified side: Secondary | ICD-10-CM

## 2011-03-06 DIAGNOSIS — I10 Essential (primary) hypertension: Secondary | ICD-10-CM | POA: Diagnosis present

## 2011-03-06 DIAGNOSIS — E119 Type 2 diabetes mellitus without complications: Secondary | ICD-10-CM | POA: Diagnosis present

## 2011-03-06 DIAGNOSIS — R32 Unspecified urinary incontinence: Secondary | ICD-10-CM | POA: Diagnosis present

## 2011-03-06 DIAGNOSIS — R000 Tachycardia, unspecified: Secondary | ICD-10-CM | POA: Diagnosis present

## 2011-03-06 HISTORY — DX: Peripheral vascular disease, unspecified: I73.9

## 2011-03-06 HISTORY — DX: Aphagia: R13.0

## 2011-03-06 HISTORY — DX: Major depressive disorder, single episode, unspecified: F32.9

## 2011-03-06 HISTORY — DX: Diverticulosis of intestine, part unspecified, without perforation or abscess without bleeding: K57.90

## 2011-03-06 HISTORY — DX: Depression, unspecified: F32.A

## 2011-03-06 HISTORY — DX: Methicillin resistant Staphylococcus aureus infection, unspecified site: A49.02

## 2011-03-06 HISTORY — DX: Unspecified convulsions: R56.9

## 2011-03-06 HISTORY — DX: Personal history of other venous thrombosis and embolism: Z86.718

## 2011-03-06 HISTORY — DX: Acute embolism and thrombosis of unspecified deep veins of unspecified lower extremity: I82.409

## 2011-03-06 HISTORY — DX: Other specified health status: Z78.9

## 2011-03-06 HISTORY — DX: Hypothyroidism, unspecified: E03.9

## 2011-03-06 HISTORY — DX: Anemia, unspecified: D64.9

## 2011-03-06 HISTORY — DX: Disorder of arteries and arterioles, unspecified: I77.9

## 2011-03-06 LAB — HEPATIC FUNCTION PANEL
ALT: 13 U/L (ref 0–35)
Alkaline Phosphatase: 82 U/L (ref 39–117)
Bilirubin, Direct: <0.1 mg/dL (ref 0.0–0.3)
Total Bilirubin: 0.3 mg/dL (ref 0.3–1.2)
Total Protein: 7 g/dL (ref 6.0–8.3)

## 2011-03-06 LAB — CBC
Platelets: 247 10*3/uL (ref 150–400)
RDW: 14.1 % (ref 11.5–15.5)
WBC: 7.5 10*3/uL (ref 4.0–10.5)

## 2011-03-06 LAB — URINALYSIS, ROUTINE W REFLEX MICROSCOPIC
Glucose, UA: 100 mg/dL — AB
Protein, ur: >300 mg/dL — AB
Urobilinogen, UA: 0.2 mg/dL (ref 0.0–1.0)

## 2011-03-06 LAB — BLOOD GAS, ARTERIAL
Acid-Base Excess: 1.7 mmol/L (ref 0.0–2.0)
Bicarbonate: 25.8 mEq/L — ABNORMAL HIGH (ref 20.0–24.0)
TCO2: 27.1 mmol/L (ref 0–100)
pCO2 arterial: 41.1 mmHg (ref 35.0–45.0)
pH, Arterial: 7.414 — ABNORMAL HIGH (ref 7.350–7.400)
pO2, Arterial: 75 mmHg — ABNORMAL LOW (ref 80.0–100.0)

## 2011-03-06 LAB — URINE MICROSCOPIC-ADD ON

## 2011-03-06 LAB — PROCALCITONIN: Procalcitonin: 0.55 ng/mL

## 2011-03-06 LAB — POCT I-STAT, CHEM 8
Chloride: 105 mEq/L (ref 96–112)
Glucose, Bld: 183 mg/dL — ABNORMAL HIGH (ref 70–99)
HCT: 62 % — ABNORMAL HIGH (ref 36.0–46.0)
Hemoglobin: 21.1 g/dL (ref 12.0–15.0)
Potassium: 3 mEq/L — ABNORMAL LOW (ref 3.5–5.1)
Sodium: 144 mEq/L (ref 135–145)

## 2011-03-06 LAB — LACTIC ACID, PLASMA: Lactic Acid, Venous: 1.6 mmol/L (ref 0.5–2.2)

## 2011-03-06 LAB — LIPASE, BLOOD: Lipase: 10 U/L — ABNORMAL LOW (ref 11–59)

## 2011-03-06 MED ORDER — SODIUM CHLORIDE 0.9 % IV BOLUS (SEPSIS)
1000.0000 mL | Freq: Once | INTRAVENOUS | Status: AC
  Administered 2011-03-06: 1000 mL via INTRAVENOUS

## 2011-03-06 MED ORDER — PIPERACILLIN-TAZOBACTAM 3.375 G IVPB
3.3750 g | Freq: Three times a day (TID) | INTRAVENOUS | Status: DC
  Administered 2011-03-07 (×2): 3.375 g via INTRAVENOUS
  Filled 2011-03-06 (×4): qty 50

## 2011-03-06 MED ORDER — ONDANSETRON HCL 4 MG/2ML IJ SOLN: 4.0000 mg | Freq: Once | INTRAMUSCULAR | Status: DC

## 2011-03-06 MED ORDER — LEVOTHYROXINE SODIUM 100 MCG IV SOLR
62.5000 ug | Freq: Every day | INTRAVENOUS | Status: DC
  Filled 2011-03-06: qty 3.1

## 2011-03-06 MED ORDER — PROMETHAZINE HCL 25 MG/ML IJ SOLN: 12.5000 mg | Freq: Four times a day (QID) | INTRAMUSCULAR | Status: DC | PRN

## 2011-03-06 MED ORDER — METOPROLOL TARTRATE 1 MG/ML IV SOLN
2.5000 mg | INTRAVENOUS | Status: AC
  Administered 2011-03-06: 5 mg via INTRAVENOUS
  Filled 2011-03-06: qty 5

## 2011-03-06 MED ORDER — CLONIDINE HCL 0.1 MG PO TABS
0.1000 mg | ORAL_TABLET | ORAL | Status: AC
  Administered 2011-03-06: 0.1 mg via ORAL
  Filled 2011-03-06: qty 1

## 2011-03-06 MED ORDER — CLONIDINE HCL 0.1 MG PO TABS
0.1000 mg | ORAL_TABLET | Freq: Three times a day (TID) | ORAL | Status: DC
  Administered 2011-03-07 – 2011-03-08 (×5): 0.1 mg via ORAL
  Filled 2011-03-06 (×8): qty 1

## 2011-03-06 MED ORDER — LEVOTHYROXINE SODIUM 100 MCG IV SOLR: 75.0000 ug | Freq: Every day | INTRAVENOUS | Status: DC

## 2011-03-06 MED ORDER — SODIUM CHLORIDE 0.9 % IV SOLN: 250.0000 mL | INTRAVENOUS | Status: DC | PRN

## 2011-03-06 MED ORDER — MORPHINE SULFATE 2 MG/ML IJ SOLN
2.0000 mg | INTRAMUSCULAR | Status: DC | PRN
  Administered 2011-03-06: 2 mg via INTRAVENOUS
  Filled 2011-03-06: qty 1

## 2011-03-06 MED ORDER — TOLTERODINE TARTRATE ER 4 MG PO CP24
4.0000 mg | ORAL_CAPSULE | Freq: Two times a day (BID) | ORAL | Status: DC
  Administered 2011-03-07 – 2011-03-08 (×4): 4 mg via ORAL
  Filled 2011-03-06 (×5): qty 1

## 2011-03-06 MED ORDER — ACETAMINOPHEN 325 MG PO TABS
650.0000 mg | ORAL_TABLET | Freq: Once | ORAL | Status: AC
  Administered 2011-03-06: 650 mg via ORAL
  Filled 2011-03-06: qty 2

## 2011-03-06 MED ORDER — ONDANSETRON HCL 4 MG/2ML IJ SOLN
INTRAMUSCULAR | Status: AC
  Administered 2011-03-06: 4 mg
  Filled 2011-03-06: qty 2

## 2011-03-06 MED ORDER — ASPIRIN 300 MG RE SUPP: 300.0000 mg | RECTAL | Status: AC

## 2011-03-06 MED ORDER — INSULIN ASPART 100 UNIT/ML ~~LOC~~ SOLN
0.0000 [IU] | SUBCUTANEOUS | Status: DC
  Administered 2011-03-07: 8 [IU] via SUBCUTANEOUS
  Administered 2011-03-07 (×3): 3 [IU] via SUBCUTANEOUS
  Administered 2011-03-07: 8 [IU] via SUBCUTANEOUS
  Administered 2011-03-07 – 2011-03-08 (×2): 3 [IU] via SUBCUTANEOUS
  Administered 2011-03-08: 2 [IU] via SUBCUTANEOUS
  Administered 2011-03-08: 1 [IU] via SUBCUTANEOUS
  Filled 2011-03-06: qty 3

## 2011-03-06 MED ORDER — DEXTROSE 50 % IV SOLN: 50.0000 mL | Freq: Once | INTRAVENOUS | Status: AC | PRN

## 2011-03-06 MED ORDER — LEVOFLOXACIN IN D5W 500 MG/100ML IV SOLN
500.0000 mg | Freq: Every day | INTRAVENOUS | Status: DC
  Administered 2011-03-07: 500 mg via INTRAVENOUS
  Filled 2011-03-06 (×3): qty 100

## 2011-03-06 MED ORDER — LEVOTHYROXINE SODIUM 125 MCG PO TABS
125.0000 ug | ORAL_TABLET | Freq: Every day | ORAL | Status: DC
  Administered 2011-03-07 – 2011-03-08 (×2): 125 ug via ORAL
  Filled 2011-03-06 (×3): qty 1

## 2011-03-06 MED ORDER — METOPROLOL TARTRATE 1 MG/ML IV SOLN
2.5000 mg | Freq: Four times a day (QID) | INTRAVENOUS | Status: DC | PRN
Start: 2011-03-06 — End: 2011-03-08

## 2011-03-06 MED ORDER — VANCOMYCIN HCL IN DEXTROSE 1-5 GM/200ML-% IV SOLN
1000.0000 mg | INTRAVENOUS | Status: AC
  Administered 2011-03-06: 1000 mg via INTRAVENOUS
  Filled 2011-03-06: qty 200

## 2011-03-06 MED ORDER — POTASSIUM CHLORIDE 10 MEQ/100ML IV SOLN
10.0000 meq | INTRAVENOUS | Status: AC
  Administered 2011-03-06 – 2011-03-07 (×2): 10 meq via INTRAVENOUS

## 2011-03-06 MED ORDER — CLONIDINE HCL 0.2 MG PO TABS
ORAL_TABLET | ORAL | Status: AC
  Filled 2011-03-06: qty 1

## 2011-03-06 MED ORDER — PIPERACILLIN-TAZOBACTAM 3.375 G IVPB 30 MIN
3.3750 g | INTRAVENOUS | Status: AC
  Administered 2011-03-06: 3.375 g via INTRAVENOUS

## 2011-03-06 MED ORDER — ASPIRIN 81 MG PO CHEW
324.0000 mg | CHEWABLE_TABLET | ORAL | Status: AC
  Administered 2011-03-06: 324 mg via ORAL
  Filled 2011-03-06: qty 4

## 2011-03-06 MED ORDER — VANCOMYCIN HCL IN DEXTROSE 1-5 GM/200ML-% IV SOLN
1000.0000 mg | Freq: Two times a day (BID) | INTRAVENOUS | Status: DC
Start: 2011-03-07 — End: 2011-03-07
  Administered 2011-03-07: 1000 mg via INTRAVENOUS
  Filled 2011-03-06 (×3): qty 200

## 2011-03-06 MED ORDER — DEXTROSE 50 % IV SOLN: 25.0000 mL | Freq: Once | INTRAVENOUS | Status: AC | PRN

## 2011-03-06 MED ORDER — GLUCOSE 40 % PO GEL: 1.0000 | ORAL | Status: DC | PRN

## 2011-03-06 MED ORDER — FENTANYL 50 MCG/HR TD PT72
50.0000 ug | MEDICATED_PATCH | TRANSDERMAL | Status: DC
  Administered 2011-03-07: 50 ug via TRANSDERMAL
  Filled 2011-03-06: qty 1

## 2011-03-06 MED ORDER — INSULIN GLARGINE 100 UNIT/ML ~~LOC~~ SOLN
3.0000 [IU] | Freq: Every day | SUBCUTANEOUS | Status: DC
  Filled 2011-03-06: qty 3

## 2011-03-06 MED ORDER — KETOROLAC TROMETHAMINE 15 MG/ML IJ SOLN
15.0000 mg | Freq: Once | INTRAMUSCULAR | Status: AC
  Administered 2011-03-07: 15 mg via INTRAVENOUS
  Filled 2011-03-06: qty 1

## 2011-03-06 NOTE — ED Notes (Signed)
No respiratory or acute distress noted resting in bed call light in reach pt alert and asphasic from old stroke good swallow but unable to understand speech.

## 2011-03-06 NOTE — ED Notes (Signed)
Dr Dorise Hiss notified of patient temp recheck

## 2011-03-06 NOTE — ED Notes (Signed)
Pt. Put on monitor due to elevated heart rate and BP.

## 2011-03-06 NOTE — ED Provider Notes (Signed)
I saw and evaluated the patient, reviewed the resident's note and I agree with the findings and plan.   Nelia Shi, MD 03/06/11 [phone]

## 2011-03-06 NOTE — ED Notes (Signed)
Received pt. In Room 18 via stretcher, pt. Alert and oriented, denies pain, c/o slight nausea, IV N/S Bolus infusing

## 2011-03-06 NOTE — Progress Notes (Signed)
ANTIBIOTIC CONSULT NOTE - INITIAL  Pharmacy Consult for Vancomycin/Zosyn Indication: r/o pna  No Known Allergies  Patient Measurements: Height: 5\' 4"  (162.6 cm) Weight: 158 lb 11.7 oz (72 kg) IBW/kg (Calculated) : 54.7  Adjusted Body Weight:   Vital Signs: Temp: 103 F (39.4 C) (11/06 1645) Temp src: Oral (11/06 1553) BP: 200/83 mmHg (11/06 1715) Pulse Rate: 108  (11/06 1715) Intake/Output from previous day:   Intake/Output from this shift:    Labs:  Basename 03/06/11 1544 03/06/11 1446  WBC -- 7.5  HGB 21.1* 12.8  PLT -- 247  LABCREA -- --  CREATININE 1.00 --   Estimated Creatinine Clearance: 56 ml/min (by C-G formula based on Cr of 1). No results found for this basename: VANCOTROUGH:2,VANCOPEAK:2,VANCORANDOM:2,GENTTROUGH:2,GENTPEAK:2,GENTRANDOM:2,TOBRATROUGH:2,TOBRAPEAK:2,TOBRARND:2,AMIKACINPEAK:2,AMIKACINTROU:2,AMIKACIN:2, in the last 72 hours   Microbiology: No results found for this or any previous visit (from the past 720 hour(s)).  Medical History: Past Medical History  Diagnosis Date  . 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 Diabetes mellitus     Prone to hypoglycemia  . Aphagia     Status post numerous  . Dysphagia   . Peripheral vascular disease, unspecified   . Arterial disease   . Depressive disorder   . Hypothyroid   . Seizures   . MRSA infection     History of MRSA in the left leg wound and sacrum in the past-has been seen by vascular surgery and declined any type of intervention including arteriogram  . Diverticulosis     GI bleed in 2007-s/p endoscopy and colonoscopy in 2007 s/p massive GIB consittent with Diveerticulosis  . DVT (deep venous thrombosis)     Not on Coumadin  . Anemia     Chronic    Medications:   (Not in a hospital admission) Assessment: 65 y/o female patient admitted with altered  mental status, fever, n/v requiring broad spectrum antibiotics for r/o pna and sepsis.  Goal of Therapy:  Vancomycin trough level 15-20 mcg/ml  Plan:  Measure antibiotic drug levels at steady state. Vancomycin 1g iv q12h and zosyn 3.375 g iv q8h. Monitor renal fxn and f/u c&s.  Leodis Binet, Kellina Dreese M 03/06/2011,6:48 PM

## 2011-03-06 NOTE — H&P (Addendum)
Allison Washington is an 65 y.o. female.   Chief Complaint: Nausea and vomiting HPI: This very pleasant 65 year old female presents to the emergency room straight from [facility] room 203. Her primary care physician is Dr. Dalene Carrow as well as Dr. Elinor Parkinson. Her nurse practitioners Wyatt Portela who can be reached at phone [phone].  History is limited as the patient does give some of the history but has difficulty verbalizing same I did call the son at low numbers and attempted to get some more further history it appears that the patient had 4-5 episodes of nausea and vomiting today and because of this as well as a fever for 102.3 it was noted that patient also was slightly hypertensive and tachycardic and patient was ultimately sent to emergency room for the same. The only medication she was given at her nursing facility were Detrol and Phenergan and Phenergan IM today for nausea. Patient specifically can give me some history but from what I gather she states that she's been throwing up she's had some pain in abdomen also chest pain please see the rest of review systems for the no. She has no current roommates who've had any other issues along these lines although she has a new roommate said she's been on this nursing facility.    Past Medical History  Diagnosis Date  . 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 Diabetes mellitus     Prone to hypoglycemia  . Aphagia     Status post numerous  . Dysphagia   . Peripheral vascular disease, unspecified   . Arterial disease   . Depressive disorder   . Hypothyroid   . Seizures   . MRSA infection     History of MRSA in the left leg wound and sacrum in the past-has been seen by vascular surgery and declined any type of intervention including arteriogram  .  Diverticulosis     GI bleed in 2007-s/p endoscopy and colonoscopy in 2007 s/p massive GIB consittent with Diveerticulosis  . DVT (deep venous thrombosis)     Not on Coumadin  . Anemia     Chronic    Past Surgical History  Procedure Date  . Above knee leg amputation   . Femoral-popliteal bypass graft     History reviewed. No pertinent family history. Social History:  has an unknown smoking status. She does not have any smokeless tobacco history on file. She reports that she does not drink alcohol or use illicit drugs.  Allergies: No Known Allergies  Medications Prior to Admission  Medication Dose Route Frequency Provider Last Rate Last Dose  . 0.9 %  sodium chloride infusion  250 mL Intravenous PRN Pleas Koch      . acetaminophen (TYLENOL) tablet 650 mg  650 mg Oral Once Genella Mech, MD   650 mg at 03/06/11 1543  . aspirin chewable tablet 324 mg  324 mg Oral NOW Jai Byrd Terrero       Or  . aspirin suppository 300 mg  300 mg Rectal NOW Pleas Koch      . cloNIDine (CATAPRES) tablet 0.1 mg  0.1 mg Oral STAT Pleas Koch      . metoprolol (LOPRESSOR) injection 2.5 mg  2.5 mg Intravenous STAT Jai Gustava Berland   5 mg at 03/06/11 1705  . ondansetron (ZOFRAN) 4  MG/2ML injection        4 mg at 03/06/11 1420  . sodium chloride 0.9 % bolus 1,000 mL  1,000 mL Intravenous Once Genella Mech, MD   1,000 mL at 03/06/11 1419  . DISCONTD: ondansetron (ZOFRAN) injection 4 mg  4 mg Intravenous Once Genella Mech, MD       No current outpatient prescriptions on file as of 03/06/2011.   Her regular medications include the following per last MAR RF Lantus 7 units subcutaneous at bedtime  Norvasc 5 mg 1 tab daily  Cilostazol 100 mg 1 tab twice a day  Clonidine 0.1 mg 3 times a day  Neurontin 300 mg one cap 3 times daily  Synthroid 125 mcg every morning  Pravachol 20 mg 1 tab daily  Robaxin 500 mg 1 tab every 6 when necessary  Nitrostat 0.4 mg sublingual 1 tab subliminally every 5 minutes  as needed  Oxycodone 5 mg 2 tablets by mouth every 6 hours as needed  And 2 tabs by mouth daily  Detrol LA 4 mg 1 tab twice a day at 8 and a 4  MiraLAX 17 g in 8 ounces water get him off the consultation  I've in Cipro 5 mg every 8 hours as needed  Ambien 10 mg one tablet by mouth daily as needed  Skelaxin 800 mg 3 times a day  Claritin 10 mg 1 tab daily  Fentanyl 50 mcg per hour patch one patch topically every 72 hours  NovoLog sliding scale insulin per specializes structures per nursing home  Results for orders placed during the hospital encounter of 03/06/11 (from the past 48 hour(s))  URINALYSIS, ROUTINE W REFLEX MICROSCOPIC     Status: Abnormal   Collection Time   03/06/11  2:41 PM      Component Value Range Comment   Color, Urine YELLOW  YELLOW     Appearance CLOUDY (*) CLEAR     Specific Gravity, Urine 1.016  1.005 - 1.030     pH 7.0  5.0 - 8.0     Glucose, UA 100 (*) NEGATIVE (mg/dL)    Hgb urine dipstick LARGE (*) NEGATIVE     Bilirubin Urine NEGATIVE  NEGATIVE     Ketones, ur NEGATIVE  NEGATIVE (mg/dL)    Protein, ur >782 (*) NEGATIVE (mg/dL)    Urobilinogen, UA 0.2  0.0 - 1.0 (mg/dL)    Nitrite NEGATIVE  NEGATIVE     Leukocytes, UA SMALL (*) NEGATIVE    URINE MICROSCOPIC-ADD ON     Status: Normal   Collection Time   03/06/11  2:41 PM      Component Value Range Comment   Squamous Epithelial / LPF RARE  RARE     WBC, UA 0-2  <3 (WBC/hpf)    RBC / HPF 11-20  <3 (RBC/hpf)    Bacteria, UA RARE  RARE    CBC     Status: Normal   Collection Time   03/06/11  2:46 PM      Component Value Range Comment   WBC 7.5  4.0 - 10.5 (K/uL)    RBC 4.73  3.87 - 5.11 (MIL/uL)    Hemoglobin 12.8  12.0 - 15.0 (g/dL)    HCT 95.6  21.3 - 08.6 (%)    MCV 82.7  78.0 - 100.0 (fL)    MCH 27.1  26.0 - 34.0 (pg)    MCHC 32.7  30.0 - 36.0 (g/dL)    RDW 57.8  46.9 - 62.9 (%)  Platelets 247  150 - 400 (K/uL)   GLUCOSE, CAPILLARY     Status: Abnormal   Collection Time   03/06/11   3:43 PM      Component Value Range Comment   Glucose-Capillary 166 (*) 70 - 99 (mg/dL)   POCT I-STAT, CHEM 8     Status: Abnormal   Collection Time   03/06/11  3:44 PM      Component Value Range Comment   Sodium 144  135 - 145 (mEq/L)    Potassium 3.0 (*) 3.5 - 5.1 (mEq/L)    Chloride 105  96 - 112 (mEq/L)    BUN 13  6 - 23 (mg/dL)    Creatinine, Ser 4.13  0.50 - 1.10 (mg/dL)    Glucose, Bld 244 (*) 70 - 99 (mg/dL)    Calcium, Ion 0.10 (*) 1.12 - 1.32 (mmol/L)    TCO2 26  0 - 100 (mmol/L)    Hemoglobin 21.1 (*) 12.0 - 15.0 (g/dL)    HCT 27.2 (*) 53.6 - 46.0 (%)   LACTIC ACID, PLASMA     Status: Normal   Collection Time   03/06/11  4:47 PM      Component Value Range Comment   Lactic Acid, Venous 1.6  0.5 - 2.2 (mmol/L)   PROCALCITONIN     Status: Normal   Collection Time   03/06/11  4:47 PM      Component Value Range Comment   Procalcitonin 0.55      Dg Chest Portable 1 View  03/06/2011  *RADIOLOGY REPORT*  Clinical Data: Vomiting with fever for 2 days.  PORTABLE CHEST - 1 VIEW  Comparison: CT and radiographs 01/14/2010.  Findings: 1520 hours.  The degree of inspiration is improved on the current examination.  Heart size and mediastinal contours are stable.  There is no evidence of hilar mass.  The lungs are clear and there is no pleural effusion.  Telemetry leads overlie the right chest.  IMPRESSION: No active cardiopulmonary process.  Original Report Authenticated By: Gerrianne Scale, M.D.    Review of Systems  Constitutional: Positive for fever, chills and diaphoresis. Negative for weight loss.  HENT: Negative for neck pain.   Eyes: Negative for blurred vision, double vision, pain and discharge.  Respiratory: Negative for cough, hemoptysis, sputum production, shortness of breath and wheezing.   Cardiovascular: Positive for chest pain (states she had some chest pain earlier today) and leg swelling (neck).  Gastrointestinal: Positive for vomiting (5-6 episodes of nausea vomiting)  and abdominal pain. Melena: denies.  Genitourinary: Negative for dysuria, urgency, frequency, hematuria and flank pain.  Musculoskeletal: Negative for myalgias and joint pain.  Skin: Positive for rash (she has a chronic rash on her left lower extremity and has been there for a long time).  Neurological: Negative for dizziness, tingling and tremors. Headaches: headache for most of today.  Endo/Heme/Allergies: Does not bruise/bleed easily.  Psychiatric/Behavioral: Positive for depression and suicidal ideas.    Blood pressure 200/83, pulse 108, temperature 103 F (39.4 C), temperature source Oral, resp. rate 24, SpO2 96.00%. Physical Exam  Constitutional: She appears well-developed and well-nourished. She appears distressed.  HENT:  Head: Normocephalic and atraumatic.  Eyes: Conjunctivae and EOM are normal. Pupils are equal, round, and reactive to light.  Neck: Normal range of motion. JVD: About 5  No tracheal deviation present. No thyromegaly present.  Cardiovascular: S1 normal and S2 normal.  Tachycardia present.  PMI is displaced.   Murmur heard.  Crescendo systolic murmur  is present with a grade of 2/6  Pulses:      Right popliteal pulse not accessible.       Right dorsalis pedis pulse not accessible.       Right posterior tibial pulse not accessible.  Respiratory: Breath sounds normal. No stridor.  GI: Soft. She exhibits no mass. Distention: present midline small scar. There is no tenderness. There is no rebound and no guarding.  Skin: Skin is warm and dry.        Assessment/Plan  Patient Active Hospital Problem List:  [place] Seen will start empiric antibiotics based on her fever trend if she recurs and has another fever drawn today likely we will need to give her broad-spectrum antibiotics of vancomycin and Zosyn. Currently she has received Haldol is afebrile. If she spikes another fever I would likely search for cause in terms of  #1 aspiration pneumonitis #2 UTI although her  urinalysis is negative and we'll follow blood cultures. She does not have any tenderness currently in her abdomen but myofascial remains low for standing her once again ordered getting an ultrasound to see next problem details #3 possible cellulitis related to MRSA in her left lower extremity which does appear slightly warm  Vomiting, persistent, in adult (03/06/2011)   Assessment: Unclear etiology. Patient seems to have had a CT scan showing the following Mild biliary ductal dilatation is nonspecific status post  cholecystectomy. Correlate with LFTs and ERCP / MRCP if clinically  warranted. Advanced atherosclerotic disease of the aorta and its branches. The celiac axis is markedly narrowed however remains patent. Consider a CTA follow-up examination. I will start with a plain abdominal film and given the fact the patient does not have a current fever or or does not have any focalizing findings findings given her workup so far in the emergency room we will hold off on further imaging.  Her differential diagnosis includes possible small bowel obstruction versus possible viral etiology versus likely upper gastrointestinal versus liver pathology and I will probably get an ultrasound in the morning as per rounding physicians subjective feeling. Patient does not have any definite tenderness and I suspect this may just been something related to either her gallbladder or some gastritis. Hypertensive urgency (03/06/2011)   Assessment: Patient has not received his clonidine or any other blood pressure medicines today and is on these medications at the nursing facility patient has been given metoprolol in the emergency room cells 1 dose by mouth clonidine for now. I will place patient on scheduled metoprolol every 6 hours until we can determine etiology of her abdominal pain. If plain abdominal films does not resolve this and keeping n.p.o. desirous of this patient will need to have all her meds converted to IV.       Sinus tachycardia (03/06/2011)   Assessment: Likely secondary to rebound tachycardia secondary to withdrawal of alpha blockade. Patient will need to be on medications and I would like to place patient on clonidine patch for the same. She did complain of some chest pain area and we are ruling out by cardiac enzymes x3 given the fact that this may be started [facility] for cardiac ischemia certainly the EKG done downstairs does not suggest the same but we will get troponins and cardiac enzymes and get an EKG in the morning  DIABETES MELLITUS, TYPE II (12/17/2006)   Assessment: Patient seems to be prone to hypoglycemia in nursing home and we'll keep the patient on the low sensitivity sliding scale for now and review the  transport the     URINARY INCONTINENCE (12/17/2006)   Assessment: Patient will continue on Detrol and she is able to take by mouth completely.   H/O: CVA (cardiovascular accident) (03/06/2011)   Assessment: Patient has profound difficulties expressing himself and with dysphagia and patient will likely need to have ongoing therapies at [facility].     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 Certainly patient has MRSA infections in the past on the left side and sacrum and does not appear to have the same at this time. Patient will likely need further revisiting of this issue with her primary care physician once etiology of her nausea vomiting or back     History of DVT (deep vein thrombosis) (03/06/2011)   Assessment: Patient has had a history of DVT but then had hematochezia in 2007 and unlikely is Coumadin or anticoagulation candidate I will place  patient aspirin for now but this will need to be reviewed by outpatient physician         Hypokalemia (03/06/2011)   Assessment: We will replace this with IV potassium for now      Time spent admitting patient 1 hour inclusive of discussion with patient and family and relatives course of care     [facility] 03/06/2011, 5:57 PM

## 2011-03-06 NOTE — ED Provider Notes (Addendum)
History     CSN: [id number] Arrival date & time: 03/06/2011  1:26 PM   First MD Initiated Contact with Patient 03/06/11 1404      Chief Complaint  Patient presents with  . Nausea    from [facility] - Eat, nausea and vomiting all day, unable to keep anything down, BP 120 palp    (Consider location/radiation/quality/duration/timing/severity/associated sxs/prior treatment) Patient is a 65 y.o. female presenting with vomiting. The history is provided by the patient.  Emesis  This is a new problem. The current episode started 12 to 24 hours ago. The problem occurs 5 to 10 times per day. The problem has not changed since onset.The emesis has an appearance of stomach contents. Pertinent negatives include no abdominal pain, no arthralgias, no chills, no cough, no diarrhea and no myalgias.  Pt complains of only nausea and vomiting, most of the vomitus is only saliva. Denies any other symptoms. No blood in any of the vomitus. Constant nausea today with pt unable to take in PO fluids. Extensive PMH as illustrated below.  Past Medical History  Diagnosis Date  . Hemiplegia and hemiparesis   . Diabetes mellitus   . Aphagia   . Dysphagia   . Peripheral vascular disease, unspecified   . Arterial disease   . Dementia   . Depressive disorder   . CNS disorder   . Hypothyroid   . Seizures   . MRSA infection   . Diverticulosis   . DVT (deep venous thrombosis)   . Anemia     Past Surgical History  Procedure Date  . Above knee leg amputation   . Femoral-popliteal bypass graft     History reviewed. No pertinent family history.  History  Substance Use Topics  . Smoking status: Unknown If Ever Smoked  . Smokeless tobacco: Not on file  . Alcohol Use: No    OB History    Grav Para Term Preterm Abortions TAB SAB Ect Mult Living                  Review of Systems  Constitutional: Negative for chills, diaphoresis, activity change and fatigue.  Eyes: Negative.   Respiratory: Negative.   Negative for cough, choking, chest tightness, shortness of breath, wheezing and stridor.   Cardiovascular: Negative for chest pain, palpitations and leg swelling.  Gastrointestinal: Positive for nausea and vomiting. Negative for abdominal pain, diarrhea, constipation, blood in stool, abdominal distention and anal bleeding.       Last bowel movement last night and normal, no blood in bowel movements.   Genitourinary: Negative.   Musculoskeletal: Positive for gait problem. Negative for myalgias, back pain, joint swelling and arthralgias.       Pt is hemiplagic, unable to ambulate normally.   Skin: Negative.   Neurological: Positive for weakness.       Pt has L hemiplegia from past CVA  All other systems reviewed and are negative.    Allergies  Review of patient's allergies indicates no known allergies.  Home Medications   Current Outpatient Rx  Name Route Sig Dispense Refill  . AMLODIPINE BESYLATE 5 MG PO TABS Oral Take 5 mg by mouth daily.      Marland Kitchen BISACODYL 10 MG RE SUPP Rectal Place 10 mg rectally daily as needed. For constipation; give if not relieved by milk of magnesia     . CILOSTAZOL 100 MG PO TABS Oral Take 100 mg by mouth 2 (two) times daily.      Marland Kitchen CLONIDINE HCL 0.1 MG  PO TABS Oral Take 0.1 mg by mouth 3 (three) times daily.      Marland Kitchen GABAPENTIN 300 MG PO CAPS Oral Take 300 mg by mouth 3 (three) times daily.      . INSULIN ASPART 100 UNIT/ML [place] SOLN Subcutaneous Inject 5 Units into the skin daily with supper. Give if patient eats >50% of meals     . INSULIN ASPART 100 UNIT/ML [place] SOLN Subcutaneous Inject 4-12 Units into the skin 3 (three) times daily before meals. CBG 150-200= 4 units; 201-250= 6 units; 251-300= 8 units; 301-350= 10 units; 351-400= 12 units; if CBG<60 or >400 call MD     . INSULIN GLARGINE 100 UNIT/ML  SOLN Subcutaneous Inject 57 Units into the skin at bedtime.      Marland Kitchen LEVOTHYROXINE SODIUM 125 MCG PO TABS Oral Take 125 mcg by mouth daily.      Marland Kitchen LORATADINE 10 MG PO  TABS Oral Take 10 mg by mouth daily. For allergies     . LORAZEPAM 0.5 MG PO TABS Oral Take 0.5 mg by mouth every 8 (eight) hours as needed. For anxiety     . MAGNESIUM HYDROXIDE 400 MG/5ML PO SUSP Oral Take 30 mLs by mouth daily as needed. For constipation     . METAXALONE 800 MG PO TABS Oral Take 800 mg by mouth 3 (three) times daily.      Marland Kitchen METHOCARBAMOL 500 MG PO TABS Oral Take 500 mg by mouth every 6 (six) hours as needed. For muscle spasms     . NITROGLYCERIN 0.4 MG SL SUBL Sublingual Place 0.4 mg under the tongue every 5 (five) minutes as needed. For chest pain     . OXYCODONE HCL 5 MG PO TABS Oral Take 10 mg by mouth every hour as needed. For pain     . POLYETHYLENE GLYCOL 3350 PO PACK Oral Take 17 g by mouth daily. Mix in 8 ounces of water; for constipation     . PRAVASTATIN SODIUM 20 MG PO TABS Oral Take 20 mg by mouth daily.      . SENNA 8.6 MG PO TABS Oral Take 2 tablets by mouth daily.      . TOLTERODINE TARTRATE 4 MG PO CP24 Oral Take 4 mg by mouth 2 (two) times daily. Takes at 8 AM and 4 PM     . ZOLPIDEM TARTRATE 10 MG PO TABS Oral Take 10 mg by mouth at bedtime as needed. For sleep     . FENTANYL 50 MCG/HR TD PT72 Transdermal Place 1 patch onto the skin every 3 (three) days.        BP 212/85  Pulse 135  Temp(Src) 102.3 F (39.1 C) (Oral)  Resp 26  SpO2 91%  Physical Exam  Constitutional: She appears well-developed and well-nourished.  HENT:  Head: Normocephalic and atraumatic.       Mucous membranes are dry and turgor is diminished.  Eyes: EOM are normal. Pupils are equal, round, and reactive to light.  Cardiovascular: Regular rhythm.        Tachycardic   Pulmonary/Chest: Effort normal and breath sounds normal. No respiratory distress. She has no wheezes. She has no rales.  Abdominal: Soft. Bowel sounds are normal. She exhibits no distension. There is no tenderness. There is no rebound.       Pt is obese.  Musculoskeletal: She exhibits no edema and no tenderness.        R AKA, L hemiplegia  Neurological:  Slurring of the speech and L hemiplegia from past CVA, per pt no change from baseline. Is able to answer questions and follow commands appropriately.   Skin: Skin is warm and dry.    ED Course  Procedures (including critical care time)  Labs Reviewed  URINALYSIS, ROUTINE W REFLEX MICROSCOPIC - Abnormal; Notable for the following:    Appearance CLOUDY (*)    Glucose, UA 100 (*)    Hgb urine dipstick LARGE (*)    Protein, ur >300 (*)    Leukocytes, UA SMALL (*)    All other components within normal limits  GLUCOSE, CAPILLARY - Abnormal; Notable for the following:    Glucose-Capillary 166 (*)    All other components within normal limits  POCT I-STAT, CHEM 8 - Abnormal; Notable for the following:    Potassium 3.0 (*)    Glucose, Bld 183 (*)    Calcium, Ion 1.09 (*)    Hemoglobin 21.1 (*)    HCT 62.0 (*)    All other components within normal limits  CBC  URINE MICROSCOPIC-ADD ON  I-STAT, CHEM 8  CULTURE, BLOOD (ROUTINE X 2)  CULTURE, BLOOD (ROUTINE X 2)  POCT CBG MONITORING  LACTIC ACID, PLASMA  PROCALCITONIN   Dg Chest Portable 1 View  03/06/2011  *RADIOLOGY REPORT*  Clinical Data: Vomiting with fever for 2 days.  PORTABLE CHEST - 1 VIEW  Comparison: CT and radiographs 01/14/2010.  Findings: 1520 hours.  The degree of inspiration is improved on the current examination.  Heart size and mediastinal contours are stable.  There is no evidence of hilar mass.  The lungs are clear and there is no pleural effusion.  Telemetry leads overlie the right chest.  IMPRESSION: No active cardiopulmonary process.  Original Report Authenticated By: Gerrianne Scale, M.D.     1. Febrile illness   2. Hypokalemia   3. Persistent vomiting       MDM  Pt is seen with fever and nausea/vomiting keeping her from keeping down PO intake. U/A ordered to r/o UTI and chest x-ray to r/o respiratory process. CBC and iStat to check metabolic panel and blood  counts. Given pt's complicated history she made need to be admitted for observation while treatment is undertaken and until she is able to tolerate PO. She is clinically dehydrated and will be given 1 L bolus of NaCl in the ED.    Pt still febrile, given tylenol. U/A and CXR no signs of infection. Blood cultures taken and internal medicine called for admission for persistent vomiting and hypokalemia and febrile illness.  Ordered lactate and pro-calcitonin levels per [facility] hospitalist.    Genella Mech, MD Resident 03/06/11 1546  Genella Mech, MD Resident 03/06/11 1552  Genella Mech, MD Resident 03/06/11 [phone]

## 2011-03-06 NOTE — ED Notes (Signed)
Medicine Consult at the bebside

## 2011-03-06 NOTE — ED Notes (Signed)
Pt placed on 2L [place]; o2 sats increased to 95%; pt denies difficulty breathing; notified EDP about temp of 103.1- orders received and carried out

## 2011-03-06 NOTE — ED Notes (Signed)
Pt.'s clean catch urine specimen was light yellow and cloudy in appearance

## 2011-03-06 NOTE — ED Notes (Signed)
No respiratory or acute distress noted resting in bed family at bedside call light in reach.

## 2011-03-07 ENCOUNTER — Other Ambulatory Visit: Payer: Self-pay

## 2011-03-07 ENCOUNTER — Encounter (HOSPITAL_COMMUNITY): Payer: Self-pay | Admitting: Emergency Medicine

## 2011-03-07 DIAGNOSIS — I369 Nonrheumatic tricuspid valve disorder, unspecified: Secondary | ICD-10-CM

## 2011-03-07 LAB — GLUCOSE, CAPILLARY
Glucose-Capillary: 154 mg/dL — ABNORMAL HIGH (ref 70–99)
Glucose-Capillary: 275 mg/dL — ABNORMAL HIGH (ref 70–99)
Glucose-Capillary: 157 mg/dL — ABNORMAL HIGH (ref 70–99)

## 2011-03-07 LAB — COMPREHENSIVE METABOLIC PANEL
ALT: 18 U/L (ref 0–35)
Albumin: 2.6 g/dL — ABNORMAL LOW (ref 3.5–5.2)
Alkaline Phosphatase: 82 U/L (ref 39–117)
Chloride: 105 mEq/L (ref 96–112)
Potassium: 3.1 mEq/L — ABNORMAL LOW (ref 3.5–5.1)
Sodium: 142 mEq/L (ref 135–145)
Total Bilirubin: 0.4 mg/dL (ref 0.3–1.2)
Total Protein: 7.4 g/dL (ref 6.0–8.3)

## 2011-03-07 LAB — CBC
Hemoglobin: 12.8 g/dL (ref 12.0–15.0)
Hemoglobin: 12.1 g/dL (ref 12.0–15.0)
MCH: 26.9 pg (ref 26.0–34.0)
MCH: 26.7 pg (ref 26.0–34.0)
MCHC: 32.3 g/dL (ref 30.0–36.0)
Platelets: 245 10*3/uL (ref 150–400)
Platelets: 228 10*3/uL (ref 150–400)
RBC: 4.76 MIL/uL (ref 3.87–5.11)
RBC: 4.53 MIL/uL (ref 3.87–5.11)

## 2011-03-07 LAB — CARDIAC PANEL(CRET KIN+CKTOT+MB+TROPI)
CK, MB: 13.1 ng/mL (ref 0.3–4.0)
CK, MB: 8.4 ng/mL (ref 0.3–4.0)
Relative Index: 1 (ref 0.0–2.5)
Total CK: 889 U/L — ABNORMAL HIGH (ref 7–177)
Troponin I: 2.96 ng/mL (ref <0.30)
Troponin I: 2.86 ng/mL (ref <0.30)

## 2011-03-07 LAB — DIFFERENTIAL
Basophils Relative: 0 % (ref 0–1)
Eosinophils Absolute: 0 10*3/uL (ref 0.0–0.7)
Lymphs Abs: 0.7 10*3/uL (ref 0.7–4.0)
Monocytes Relative: 9 % (ref 3–12)
Neutro Abs: 3.8 10*3/uL (ref 1.7–7.7)
Neutrophils Relative %: 76 % (ref 43–77)

## 2011-03-07 LAB — BASIC METABOLIC PANEL
Calcium: 8 mg/dL — ABNORMAL LOW (ref 8.4–10.5)
GFR calc Af Amer: 71 mL/min — ABNORMAL LOW (ref >90)
GFR calc non Af Amer: 61 mL/min — ABNORMAL LOW (ref >90)
Glucose, Bld: 188 mg/dL — ABNORMAL HIGH (ref 70–99)
Potassium: 3.3 mEq/L — ABNORMAL LOW (ref 3.5–5.1)
Sodium: 143 mEq/L (ref 135–145)

## 2011-03-07 MED ORDER — INSULIN GLARGINE 100 UNIT/ML ~~LOC~~ SOLN
25.0000 [IU] | Freq: Every day | SUBCUTANEOUS | Status: DC
  Administered 2011-03-07: 25 [IU] via SUBCUTANEOUS
  Filled 2011-03-07: qty 3

## 2011-03-07 MED ORDER — ACETAMINOPHEN 650 MG RE SUPP: 650.0000 mg | RECTAL | Status: DC | PRN

## 2011-03-07 MED ORDER — ASPIRIN 81 MG PO CHEW
324.0000 mg | CHEWABLE_TABLET | Freq: Every day | ORAL | Status: DC
  Administered 2011-03-07: 324 mg via ORAL
  Filled 2011-03-07: qty 1
  Filled 2011-03-07: qty 4

## 2011-03-07 MED ORDER — METOPROLOL TARTRATE 12.5 MG HALF TABLET
12.5000 mg | ORAL_TABLET | Freq: Two times a day (BID) | ORAL | Status: DC
Start: 2011-03-07 — End: 2011-03-07
  Administered 2011-03-07: 12.5 mg via ORAL
  Filled 2011-03-07 (×3): qty 1

## 2011-03-07 MED ORDER — FUROSEMIDE 10 MG/ML IJ SOLN
INTRAMUSCULAR | Status: AC
  Filled 2011-03-07: qty 4

## 2011-03-07 MED ORDER — POTASSIUM CHLORIDE IN NACL 20-0.9 MEQ/L-% IV SOLN
INTRAVENOUS | Status: DC
  Administered 2011-03-07 – 2011-03-08 (×2): via INTRAVENOUS
  Filled 2011-03-07 (×4): qty 1000

## 2011-03-07 MED ORDER — ACETAMINOPHEN 325 MG PO TABS
650.0000 mg | ORAL_TABLET | Freq: Four times a day (QID) | ORAL | Status: DC | PRN
  Administered 2011-03-07: 650 mg via ORAL
  Filled 2011-03-07: qty 2

## 2011-03-07 MED ORDER — METOPROLOL TARTRATE 25 MG PO TABS
25.0000 mg | ORAL_TABLET | Freq: Two times a day (BID) | ORAL | Status: DC
  Administered 2011-03-07 – 2011-03-08 (×3): 25 mg via ORAL
  Filled 2011-03-07 (×4): qty 1

## 2011-03-07 MED ORDER — ONDANSETRON HCL 4 MG/2ML IJ SOLN: 4.0000 mg | INTRAMUSCULAR | Status: DC | PRN

## 2011-03-07 MED ORDER — NITROGLYCERIN 2 % TD OINT
1.0000 [in_us] | TOPICAL_OINTMENT | Freq: Four times a day (QID) | TRANSDERMAL | Status: DC
  Administered 2011-03-07 – 2011-03-08 (×7): 1 [in_us] via TOPICAL
  Filled 2011-03-07: qty 30

## 2011-03-07 MED ORDER — ASPIRIN 300 MG RE SUPP
300.0000 mg | Freq: Every day | RECTAL | Status: DC
  Filled 2011-03-07 (×2): qty 1

## 2011-03-07 MED ORDER — KCL IN DEXTROSE-NACL 20-5-0.45 MEQ/L-%-% IV SOLN
INTRAVENOUS | Status: DC
  Administered 2011-03-07: 06:00:00 via INTRAVENOUS
  Filled 2011-03-07 (×3): qty 1000

## 2011-03-07 NOTE — Progress Notes (Signed)
Subjective: I have reviewed the patient's admission history and physical and data base. History is difficult given her aphasia but I do feel that I am able to communicate with her adequately. She specifically denies any pain in the chest or any further vomiting. She denies abdominal pain or shortness of breath. She does report chronic pain in her left lower extremity. She states that she is hungry and wants to eat.  Objective: Weight change:   Intake/Output Summary (Last 24 hours) at 03/07/11 0910 Last data filed at 03/07/11 0700  Gross per 24 hour  Intake  497.5 ml  Output      0 ml  Net  497.5 ml   Blood pressure 166/67, pulse 83, temperature 98.7 F (37.1 C), temperature source Oral, resp. rate 28, height 5\' 4"  (1.626 m), weight 80.3 kg (177 lb 0.5 oz), SpO2 95.00%. Temp:  [98.7 F (37.1 C)-103.1 F (39.5 C)] 98.7 F (37.1 C) (11/07 0825) Pulse Rate:  [83-135] 83  (11/07 0825) Resp:  [8-30] 28  (11/07 0825) BP: (155-216)/(59-124) 166/67 mmHg (11/07 0825) SpO2:  [86 %-97 %] 95 % (11/07 0825) Weight:  [72 kg (158 lb 11.7 oz)-80.3 kg (177 lb 0.5 oz)] 177 lb 0.5 oz (80.3 kg) (11/07 0500)  Physical Exam: General: No acute respiratory distress Lungs: Clear to auscultation bilaterally without wheezes or crackles Cardiovascular: Regular rate and rhythm without murmur gallop or rub normal S1 and S2 Abdomen: Nontender, nondistended, soft, bowel sounds positive, no rebound, no ascites, no appreciable mass Extremities: No significant cyanosis, clubbing, or edema of the left lower extremity. 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 Cutaneous:  I see no evidence of significant wounds on the left lower extremity or the right AKA stump  Lab Results:  [facility] 03/06/11 2326 03/06/11 1544  NA 142 144  K 3.1* 3.0*  CL 105 105  CO2 26 --  GLUCOSE 162* 183*  BUN 11 13  CREATININE 1.00 1.00  CALCIUM 8.5 --  MG -- --  PHOS -- --     Basename 03/06/11 2326 03/06/11 1735  AST 38* 23  ALT 18 13  ALKPHOS 82 82  BILITOT 0.4 0.3  PROT 7.4 7.0  ALBUMIN 2.6* 2.5*    Basename 03/06/11 1735  LIPASE 10*  AMYLASE 106*    Basename 03/06/11 2326 03/06/11 1544 03/06/11 1446  WBC 5.0 -- 7.5  NEUTROABS 3.8 -- --  HGB 12.8 21.1* 12.8  HCT 39.6 62.0* 39.1  MCV 83.2 -- 82.7  PLT 245 -- 247    Basename 03/06/11 2328  CKTOTAL 889*  CKMB 13.1*  CKMBINDEX --  TROPONINI 3.95*   No results found for this basename: POCBNP:3 in the last 72 hours No results found for this basename: DDIMER:2 in the last 72 hours No results found for this basename: HGBA1C:12 in the last 72 hours No results found for this basename: CHOL:2,HDL:2,LDLCALC:2,TRIG:2,CHOLHDL:2,LDLDIRECT:2 in the last 72 hours No results found for this basename: TSH,T4TOTAL,FREET3,T3FREE,THYROIDAB in the last 72 hours No results found for this basename: VITAMINB12:2,FOLATE:2,FERRITIN:2,TIBC:2,IRON:2,RETICCTPCT:2 in the last 72 hours  Micro Results: Recent Results (from the past 240 hour(s))  CULTURE, BLOOD (ROUTINE X 2)     Status: Normal (Preliminary result)   Collection Time   03/06/11  3:00 PM      Component Value Range Status Comment   Specimen Description BLOOD ARM LEFT   Final    Special Requests BOTTLES DRAWN AEROBIC AND ANAEROBIC 10CC   Final  Setup Time [id number]   Final    Culture     Final    Value:        BLOOD CULTURE RECEIVED NO GROWTH TO DATE CULTURE WILL BE HELD FOR 5 DAYS BEFORE ISSUING A FINAL NEGATIVE REPORT   Report Status PENDING   Incomplete   CULTURE, BLOOD (ROUTINE X 2)     Status: Normal (Preliminary result)   Collection Time   03/06/11  5:35 PM      Component Value Range Status Comment   Specimen Description BLOOD ARM LEFT   Final    Special Requests BOTTLES DRAWN AEROBIC AND ANAEROBIC 10CC   Final    Setup Time [id number]   Final    Culture     Final    Value:        BLOOD CULTURE RECEIVED NO GROWTH TO DATE CULTURE WILL BE  HELD FOR 5 DAYS BEFORE ISSUING A FINAL NEGATIVE REPORT   Report Status PENDING   Incomplete   MRSA PCR SCREENING     Status: Normal   Collection Time   03/06/11 11:50 PM      Component Value Range Status Comment   MRSA by PCR NEGATIVE  NEGATIVE  Final     Studies/Results: Scheduled Meds:   . acetaminophen  650 mg Oral Once  . aspirin  324 mg Oral NOW   Or  . aspirin  300 mg Rectal NOW  . aspirin  324 mg Oral Daily   Or  . aspirin  300 mg Rectal Daily  . cloNIDine      . cloNIDine  0.1 mg Oral STAT  . cloNIDine  0.1 mg Oral TID  . fentaNYL  50 mcg Transdermal Q72H  . insulin aspart  0-15 Units Subcutaneous Q4H  . insulin glargine  25 Units Subcutaneous QHS  . ketorolac  15 mg Intravenous Once  . levofloxacin (LEVAQUIN) IV  500 mg Intravenous QHS  . levothyroxine  62.5 mcg Intravenous Daily  . levothyroxine  125 mcg Oral Daily  . metoprolol  2.5 mg Intravenous STAT  . metoprolol tartrate  12.5 mg Oral BID  . nitroGLYCERIN  1 inch Topical Q6H  . ondansetron      . piperacillin-tazobactam  3.375 g Intravenous STAT  . piperacillin-tazobactam (ZOSYN)  IV  3.375 g Intravenous Q8H  . potassium chloride  10 mEq Intravenous Q1 Hr x 4  . sodium chloride  1,000 mL Intravenous Once  . tolterodine  4 mg Oral BID  . vancomycin  1,000 mg Intravenous STAT  . vancomycin  1,000 mg Intravenous Q12H  . DISCONTD: insulin glargine  3 Units Subcutaneous QHS  . DISCONTD: levothyroxine  76 mcg Intravenous Daily  . DISCONTD: ondansetron (ZOFRAN) IV  4 mg Intravenous Once   Continuous Infusions:   . dextrose 5 % and 0.45 % NaCl with KCl 20 mEq/L 75 mL/hr at 03/07/11 0700   PRN Meds:.sodium chloride, acetaminophen, acetaminophen, dextrose, dextrose, dextrose, metoprolol, morphine injection, promethazine  Assessment/Plan:  Fever of unknown origin She has had fevers as high as 103 during her stay. Her mental status is alert and she has no symptoms to suggest meningitis. Chest x-ray and  urinalysis are unrevealing. There is no evidence of a severe infected wound or cellulitis. I am beginning to wonder if this is not central nervous system/hypothalamus dysfunction. We will continue to follow her for now. I do not feel that antibiotics are appropriate now as we have no idea will be attempting to treat.  We'll followup chest x-ray in the morning as she would be at high risk for possible aspiration pneumonitis. Given her GI symptoms is quite possible she simply has a gastrointestinal viral illness.  Vomiting The source of the patient's vomiting is not clear. A KUB was accomplished which revealed no evidence of obstruction. A UA is not consistent with a pyelonephritis/UTI. For now does appear that her symptoms have resolved. We will attempt to advance her diet.  Elevated cardiac enzymes-single episode At this point the significance of her single elevated troponin is not clear. Her EKG is without acute changes at the present time. She has absolutely no chest pain whatsoever. Her vital signs are stable. At this time I will continue medical management only and continue to cycle her cardiac enzymes. Should her second set prove to be even higher we will consider consulting cardiology. If her enzymes have improved however we will simply continue to treat her medically and follow her.  DIABETES MELLITUS, TYPE II CBGs are elevated at this time. We will attempt to advance diet and discontinue dextrose in her IV.  URINARY INCONTINENCE The patient specifically asked that her Detrol be resumed as soon as possible.  I see no contraindications for this medicine at this time and we will make sure that it is resumed.  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 At the present time there is no evidence of severe ischemia or significant active wounds.  Hypertensive urgency Her blood pressure remained somewhat labile at present. We will adjust her medications and follow trend.  Sinus tachycardia Likely related to her fever. Follow.  Hypokalemia Resume oral diet and follow trend.      LOS: 1 day   Dylanie Quesenberry T 03/07/2011, 9:10 AM

## 2011-03-07 NOTE — Progress Notes (Signed)
RN called regarding abnormal labs. Pt with elevated CPK/MB and TNI is 3.95. Pt does not endorse current CP nor did she endorse CP when I examined her earlier after arrival to 3301. EKG is pending for the am but I have changed to now given abnormal enzymes. Initially her BP was elevated but is better now with application of clonidine patch and control of RLE pain with 1x dose of Toradol. Current SBP is 158. Initial presenting sx's of acute N/V thought to be 2/2 gastroenteritis or other infectious causes but likely ischemic etiology. Will go ahead and start IV Heparin for cardiac ischemia, she received ASA in the ER. Will change prn Lopressor to scheduled low dose and will order ECHO since was last done in 2007. If EKG abnormal for STEMI changes will call cardiology tonight otherwise cardiology can be consulted in the am. Will make NPO since may be appropriate cardiac catheterization candidate. Suspect demand ischemia given concurrent FUO.   Addendum:  EKG shows subtle ST segment down-sloping without true depression seen in inferior leads with slightly more ST segment down-sloping in lateral leads but again no true depression. Was compared to Dec 2011 EKG. Discussed with Dr. Toniann Fail- since no sx's will continue med rxn and notify cardiology in am.

## 2011-03-07 NOTE — Progress Notes (Signed)
Clinical Social Work attempted to assess patient, patient currently in procedure with medical staff. Clinical social worker will attempt to assess tomorrow, Nov. 8th.   Catha Gosselin, [place]  [phone]   3:39 pm

## 2011-03-07 NOTE — Progress Notes (Signed)
  Echocardiogram 2D Echocardiogram has been performed.  Mercy Moore 03/07/2011, 4:11 PM

## 2011-03-08 ENCOUNTER — Inpatient Hospital Stay (HOSPITAL_COMMUNITY): Payer: Medicare Other

## 2011-03-08 DIAGNOSIS — R748 Abnormal levels of other serum enzymes: Secondary | ICD-10-CM | POA: Diagnosis not present

## 2011-03-08 LAB — GLUCOSE, CAPILLARY
Glucose-Capillary: 131 mg/dL — ABNORMAL HIGH (ref 70–99)
Glucose-Capillary: 117 mg/dL — ABNORMAL HIGH (ref 70–99)
Glucose-Capillary: 185 mg/dL — ABNORMAL HIGH (ref 70–99)

## 2011-03-08 MED ORDER — LORATADINE 10 MG PO TABS
10.0000 mg | ORAL_TABLET | Freq: Every day | ORAL | Status: DC
  Administered 2011-03-08: 10 mg via ORAL
  Filled 2011-03-08: qty 1

## 2011-03-08 MED ORDER — AMLODIPINE BESYLATE 5 MG PO TABS
5.0000 mg | ORAL_TABLET | Freq: Every day | ORAL | Status: DC
  Administered 2011-03-08: 5 mg via ORAL
  Filled 2011-03-08: qty 1

## 2011-03-08 MED ORDER — LORAZEPAM 0.5 MG PO TABS: 0.5000 mg | ORAL_TABLET | Freq: Three times a day (TID) | ORAL | Status: DC | PRN

## 2011-03-08 MED ORDER — METHOCARBAMOL 500 MG PO TABS
500.0000 mg | ORAL_TABLET | Freq: Four times a day (QID) | ORAL | Status: DC | PRN
  Filled 2011-03-08: qty 1

## 2011-03-08 MED ORDER — INSULIN GLARGINE 100 UNIT/ML ~~LOC~~ SOLN: 30.0000 [IU] | Freq: Every day | SUBCUTANEOUS | Status: DC

## 2011-03-08 MED ORDER — OXYCODONE HCL 5 MG PO TABS: 10.0000 mg | ORAL_TABLET | ORAL | Status: DC | PRN

## 2011-03-08 MED ORDER — ACETAMINOPHEN 325 MG PO TABS: 650.0000 mg | ORAL_TABLET | Freq: Four times a day (QID) | ORAL | Status: AC | PRN

## 2011-03-08 MED ORDER — GABAPENTIN 300 MG PO CAPS
300.0000 mg | ORAL_CAPSULE | Freq: Three times a day (TID) | ORAL | Status: DC
  Administered 2011-03-08: 300 mg via ORAL
  Filled 2011-03-08 (×3): qty 1

## 2011-03-08 MED ORDER — ISOSORBIDE MONONITRATE ER 30 MG PO TB24: 30.0000 mg | ORAL_TABLET | Freq: Every day | ORAL | Status: DC

## 2011-03-08 MED ORDER — METOPROLOL TARTRATE 25 MG PO TABS: 25.0000 mg | ORAL_TABLET | Freq: Two times a day (BID) | ORAL | Status: DC

## 2011-03-08 MED ORDER — ZOLPIDEM TARTRATE 5 MG PO TABS: 10.0000 mg | ORAL_TABLET | Freq: Every evening | ORAL | Status: DC | PRN

## 2011-03-08 MED ORDER — SIMVASTATIN 5 MG PO TABS
5.0000 mg | ORAL_TABLET | Freq: Every day | ORAL | Status: DC
  Filled 2011-03-08: qty 1

## 2011-03-08 MED ORDER — CILOSTAZOL 100 MG PO TABS
100.0000 mg | ORAL_TABLET | Freq: Two times a day (BID) | ORAL | Status: DC
  Administered 2011-03-08: 100 mg via ORAL
  Filled 2011-03-08 (×2): qty 1

## 2011-03-08 MED ORDER — ASPIRIN 81 MG PO CHEW: 324.0000 mg | CHEWABLE_TABLET | Freq: Every day | ORAL | Status: DC

## 2011-03-08 MED ORDER — METAXALONE 800 MG PO TABS
800.0000 mg | ORAL_TABLET | Freq: Three times a day (TID) | ORAL | Status: DC
  Administered 2011-03-08: 800 mg via ORAL
  Filled 2011-03-08 (×3): qty 1

## 2011-03-08 NOTE — Clinical Documentation Improvement (Signed)
Hypertension Documentation Clarification Query  Dear Dr. Colen Darling  In an effort to better capture your patient's severity of illness, reflect appropriate length of stay and utilization of resources, a review of the patient medical record has revealed the following indicators.    Based on your clinical judgment, please clarify and document in a progress note and/or discharge summary the clinical condition associated with the following supporting information:  In responding to this query please exercise your independent judgment.  The fact that a query is asked, does not imply that any particular answer is desired or expected. BP 212/85 in ed , 186/85, 200/76, 200/83, 182/78, 185/74, 190/59, 187/60, 216/68 on 11/6 Started on Clonidine 0.1 po tid, then changed to Lopressor 12.5 mg bid then changed to 25mg  bid.  Documenting Hypertensive urgency in notes and H + P.   Please clarify secondary diagnosis if appropriate.  Thank you   Possible Clinical Conditions?   " Hypertension  " Accelerated Hypertension  " Malignant Hypertension  " Or Other Condition __________________________   " Cannot Clinically Determine   Supporting Information:  Risk Factors: h/o cva, pvd, arterial disease  Signs and Symptoms: tachycardic and hypertensive on admit   Diagnostics: Troponin level: no previous values 11/6 3.95, 11/7 2.96/ 2.86 CPKMB:   3/09 6.4, 11/6 13.1, 11/7 9.4/ 8.4    Echo: none Radiology:11/6" No active cardiopulmonary process"  "Pulmonary venous congestion is noted. Stable exam 11/8  Treatment: see above note   You may use possible, probable, or suspect with inpatient documentation. Possible, probable, suspected diagnoses MUST be documented at the time of discharge.  Reviewed: no change from initial dx of "hypertensive urgency" noted in doc/ or d/c summary   Thank You,  Sincerely, Leonette Most Bexleigh Theriault  Clinical Documentation Specialist: RN, BSN  Pager [phone]  Health Information Management [place]

## 2011-03-08 NOTE — Discharge Summary (Signed)
DISCHARGE SUMMARY  Allison Washington  MR#: [id number]  DOB:03/27/1946  Date of Admission: 03/06/2011 Date of Discharge: 03/08/2011  Attending Physician:ELLIS,ALLISON L.  Patient's PCP:No primary provider on file.  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  Contrast: OMNIPAQUE IOHEXOL 300 MG/ML IV SOLN  Comparison: 01/14/2010 ultrasound  Findings: Right middle and bibasilar scarring as well as lingular scarring.  Heart size upper normal limits to mildly enlarged. Trace pericardial fluid versus thickening.  Unremarkable liver, spleen.  Status post cholecystectomy.  Mild biliary ductal dilatation, measuring up to 9 mm.  No obstructing lesion identified, with smooth tapering to the level of the ampulla.  Bilateral nodular adrenal glands without a dominant nodule.  Symmetric renal enhancement.  Advanced renal vascular calcifications.  Hypodensity within the left upper pole is too small to further characterize.  No hydronephrosis or hydroureter. No urinary tract calculi identified.  Laxity of the anterior abdominal wall.  No bowel obstruction or small bowel wall thickening.  No CT evidence for colitis. Normal appendix.  No free intraperitoneal air or fluid.   Thin-walled bladder.  No definite abnormality of the uterus or adnexa.  No lymphadenopathy.  Advanced atherosclerotic calcification of the aorta and its vessels. The celiac axis is markedly narrowed with irregular wall thickening.  No no acute osseous abnormality.  IMPRESSION: Mild biliary ductal dilatation is nonspecific status post cholecystectomy.  Correlate with LFTs and ERCP / MRCP if clinically warranted.  Advanced atherosclerotic disease of the aorta and its branches. The celiac axis is markedly narrowed however remains patent. Consider a CTA follow-up examination.  Original Report Authenticated By: Waneta Martins, M.D.   Dg Chest Portable 1 View  03/08/2011  *RADIOLOGY REPORT*  Clinical Data: Rule out infiltrate  PORTABLE CHEST - 1 VIEW  Comparison: 03/06/2011  Findings: The heart size appears normal.  No pleural effusion or pulmonary edema identified.  Diffuse coarsened interstitial markings are noted bilaterally.  Pulmonary venous congestion is noted.  No focal bony abnormalities.  IMPRESSION:  1.  Stable exam.  No change from 03/06/2011.  Original Report Authenticated By: Rosealee Albee, M.D.   Dg Chest Portable 1 View  03/06/2011  *RADIOLOGY REPORT*  Clinical Data: Vomiting with fever for 2 days.  PORTABLE CHEST - 1 VIEW  Comparison: CT and radiographs 01/14/2010.  Findings: 1520 hours.  The degree of inspiration is improved on the current examination.  Heart size and mediastinal contours are stable.  There is no evidence of hilar mass.  The lungs are clear and there is no pleural effusion.  Telemetry leads overlie the right chest.  IMPRESSION: No active cardiopulmonary process.  Original Report Authenticated By: Gerrianne Scale, M.D.   Dg Abd Portable 1v  03/07/2011  *RADIOLOGY REPORT*  Clinical Data: Abdominal distension, fever.  ABDOMEN - 1 VIEW  Comparison: 02/08/2011 CT  Findings: The lateral aspect  of the left hemiabdomen and lower pelvis are excluded from the image.   Nonobstructive bowel gas pattern.  Organ outlines normal where seen.  Cholecystectomy clips. Advanced atherosclerotic calcification of the aorta and iliac vessels.  No acute osseous abnormality identified.  IMPRESSION: Nonobstructive bowel gas pattern.  Original Report Authenticated By: Waneta Martins, M.D.    Laboratory: Results for orders placed during the hospital encounter of 03/06/11 (from the past 48 hour(s))  URINALYSIS, ROUTINE W REFLEX MICROSCOPIC     Status: Abnormal   Collection Time   03/06/11  2:41 PM      Component Value Range Comment   Color, Urine YELLOW  YELLOW     Appearance CLOUDY (*) CLEAR     Specific Gravity, Urine 1.016  1.005 - 1.030     pH 7.0  5.0 - 8.0     Glucose, UA 100 (*) NEGATIVE (mg/dL)    Hgb urine dipstick LARGE (*) NEGATIVE     Bilirubin Urine NEGATIVE  NEGATIVE     Ketones, ur NEGATIVE  NEGATIVE (mg/dL)    Protein, ur >295 (*) NEGATIVE (mg/dL)    Urobilinogen, UA 0.2  0.0 - 1.0 (mg/dL)    Nitrite NEGATIVE  NEGATIVE     Leukocytes, UA SMALL (*) NEGATIVE    URINE MICROSCOPIC-ADD ON     Status: Normal   Collection Time   03/06/11  2:41 PM      Component Value Range Comment   Squamous Epithelial / LPF RARE  RARE     WBC, UA 0-2  <3 (WBC/hpf)    RBC / HPF 11-20  <3 (RBC/hpf)    Bacteria, UA RARE  RARE    CBC     Status: Normal   Collection Time   03/06/11  2:46 PM      Component Value Range Comment   WBC 7.5  4.0 - 10.5 (K/uL)    RBC 4.73  3.87 - 5.11 (MIL/uL)    Hemoglobin 12.8  12.0 - 15.0 (g/dL)    HCT 28.4  13.2 - 44.0 (%)    MCV 82.7  78.0 - 100.0 (fL)    MCH 27.1  26.0 - 34.0 (pg)    MCHC 32.7  30.0 - 36.0 (g/dL)    RDW 10.2  72.5 - 36.6 (%)    Platelets 247  150 - 400 (K/uL)   CULTURE, BLOOD (ROUTINE X 2)     Status: Normal (Preliminary result)   Collection Time   03/06/11  3:00 PM      Component Value Range Comment   Specimen Description BLOOD ARM LEFT      Special Requests BOTTLES DRAWN AEROBIC AND ANAEROBIC 10CC      Setup Time  [id number]      Culture        Value:        BLOOD CULTURE RECEIVED NO GROWTH TO DATE CULTURE WILL BE HELD FOR 5 DAYS BEFORE ISSUING A FINAL NEGATIVE REPORT   Report Status PENDING     GLUCOSE, CAPILLARY     Status: Abnormal   Collection Time   03/06/11  3:43 PM      Component Value Range Comment   Glucose-Capillary 166 (*) 70 - 99 (mg/dL)   POCT I-STAT, CHEM 8     Status: Abnormal   Collection Time   03/06/11  3:44 PM      Component Value Range Comment   Sodium 144  135 - 145 (mEq/L)    Potassium 3.0 (*) 3.5 -  5.1 (mEq/L)    Chloride 105  96 - 112 (mEq/L)    BUN 13  6 - 23 (mg/dL)    Creatinine, Ser 1.61  0.50 - 1.10 (mg/dL)    Glucose, Bld 096 (*) 70 - 99 (mg/dL)    Calcium, Ion 0.45 (*) 1.12 - 1.32 (mmol/L)    TCO2 26  0 - 100 (mmol/L)    Hemoglobin 21.1 (*) 12.0 - 15.0 (g/dL)    HCT 40.9 (*) 81.1 - 46.0 (%)   LACTIC ACID, PLASMA     Status: Normal   Collection Time   03/06/11  4:47 PM      Component Value Range Comment   Lactic Acid, Venous 1.6  0.5 - 2.2 (mmol/L)   PROCALCITONIN     Status: Normal   Collection Time   03/06/11  4:47 PM      Component Value Range Comment   Procalcitonin 0.55     CULTURE, BLOOD (ROUTINE X 2)     Status: Normal (Preliminary result)   Collection Time   03/06/11  5:35 PM      Component Value Range Comment   Specimen Description BLOOD ARM LEFT      Special Requests BOTTLES DRAWN AEROBIC AND ANAEROBIC 10CC      Setup Time [id number]      Culture        Value:        BLOOD CULTURE RECEIVED NO GROWTH TO DATE CULTURE WILL BE HELD FOR 5 DAYS BEFORE ISSUING A FINAL NEGATIVE REPORT   Report Status PENDING     HEPATIC FUNCTION PANEL     Status: Abnormal   Collection Time   03/06/11  5:35 PM      Component Value Range Comment   Total Protein 7.0  6.0 - 8.3 (g/dL)    Albumin 2.5 (*) 3.5 - 5.2 (g/dL)    AST 23  0 - 37 (U/L)    ALT 13  0 - 35 (U/L)    Alkaline Phosphatase 82  39 - 117 (U/L)    Total Bilirubin 0.3  0.3 - 1.2 (mg/dL)    Bilirubin,  Direct <0.1  0.0 - 0.3 (mg/dL)    Indirect Bilirubin NOT CALCULATED  0.3 - 0.9 (mg/dL)   LIPASE, BLOOD     Status: Abnormal   Collection Time   03/06/11  5:35 PM      Component Value Range Comment   Lipase 10 (*) 11 - 59 (U/L)   AMYLASE     Status: Abnormal   Collection Time   03/06/11  5:35 PM      Component Value Range Comment   Amylase 106 (*) 0 - 105 (U/L)   PROTIME-INR     Status: Normal   Collection Time   03/06/11  8:29 PM      Component Value Range Comment   Prothrombin Time 14.1  11.6 - 15.2 (seconds)    INR 1.07  0.00 - 1.49    CBC     Status: Normal   Collection Time   03/06/11 11:26 PM      Component Value Range Comment   WBC 5.0  4.0 - 10.5 (K/uL)    RBC 4.76  3.87 - 5.11 (MIL/uL)    Hemoglobin 12.8  12.0 - 15.0 (g/dL) DELTA CHECK NOTED   HCT 39.6  36.0 - 46.0 (%)    MCV 83.2  78.0 - 100.0 (fL)    MCH 26.9  26.0 - 34.0 (pg)  MCHC 32.3  30.0 - 36.0 (g/dL)    RDW 55.7  32.2 - 02.5 (%)    Platelets 245  150 - 400 (K/uL)   DIFFERENTIAL     Status: Normal   Collection Time   03/06/11 11:26 PM      Component Value Range Comment   Neutrophils Relative 76  43 - 77 (%)    Neutro Abs 3.8  1.7 - 7.7 (K/uL)    Lymphocytes Relative 15  12 - 46 (%)    Lymphs Abs 0.7  0.7 - 4.0 (K/uL)    Monocytes Relative 9  3 - 12 (%)    Monocytes Absolute 0.4  0.1 - 1.0 (K/uL)    Eosinophils Relative 0  0 - 5 (%)    Eosinophils Absolute 0.0  0.0 - 0.7 (K/uL)    Basophils Relative 0  0 - 1 (%)    Basophils Absolute 0.0  0.0 - 0.1 (K/uL)   COMPREHENSIVE METABOLIC PANEL     Status: Abnormal   Collection Time   03/06/11 11:26 PM      Component Value Range Comment   Sodium 142  135 - 145 (mEq/L)    Potassium 3.1 (*) 3.5 - 5.1 (mEq/L)    Chloride 105  96 - 112 (mEq/L)    CO2 26  19 - 32 (mEq/L)    Glucose, Bld 162 (*) 70 - 99 (mg/dL)    BUN 11  6 - 23 (mg/dL)    Creatinine, Ser 4.27  0.50 - 1.10 (mg/dL)    Calcium 8.5  8.4 - 10.5 (mg/dL)    Total Protein 7.4  6.0 - 8.3 (g/dL)     Albumin 2.6 (*) 3.5 - 5.2 (g/dL)    AST 38 (*) 0 - 37 (U/L)    ALT 18  0 - 35 (U/L)    Alkaline Phosphatase 82  39 - 117 (U/L)    Total Bilirubin 0.4  0.3 - 1.2 (mg/dL)    GFR calc non Af Amer 59 (*) >90 (mL/min)    GFR calc Af Amer 68 (*) >90 (mL/min)   BLOOD GAS, ARTERIAL     Status: Abnormal   Collection Time   03/06/11 11:27 PM      Component Value Range Comment   O2 Content 2.0      pH, Arterial 7.414 (*) 7.350 - 7.400     pCO2 arterial 41.1  35.0 - 45.0 (mmHg)    pO2, Arterial 75.0 (*) 80.0 - 100.0 (mmHg)    Bicarbonate 25.8 (*) 20.0 - 24.0 (mEq/L)    TCO2 27.1  0 - 100 (mmol/L)    Acid-Base Excess 1.7  0.0 - 2.0 (mmol/L)    O2 Saturation 95.1      Patient temperature 98.6      Allens test (pass/fail) PASS  PASS    CARDIAC PANEL(CRET KIN+CKTOT+MB+TROPI)     Status: Abnormal   Collection Time   03/06/11 11:28 PM      Component Value Range Comment   Total CK 889 (*) 7 - 177 (U/L)    CK, MB 13.1 (*) 0.3 - 4.0 (ng/mL)    Troponin I 3.95 (*) <0.30 (ng/mL)    Relative Index 1.5  0.0 - 2.5    LACTIC ACID, PLASMA     Status: Normal   Collection Time   03/06/11 11:42 PM      Component Value Range Comment   Lactic Acid, Venous 1.0  0.5 - 2.2 (mmol/L)   MRSA PCR  SCREENING     Status: Normal   Collection Time   03/06/11 11:50 PM      Component Value Range Comment   MRSA by PCR NEGATIVE  NEGATIVE    GLUCOSE, CAPILLARY     Status: Abnormal   Collection Time   03/07/11 12:42 AM      Component Value Range Comment   Glucose-Capillary 171 (*) 70 - 99 (mg/dL)   GLUCOSE, CAPILLARY     Status: Abnormal   Collection Time   03/07/11  3:45 AM      Component Value Range Comment   Glucose-Capillary 177 (*) 70 - 99 (mg/dL)    Comment 1 Notify RN      Comment 2 Documented in Chart     GLUCOSE, CAPILLARY     Status: Abnormal   Collection Time   03/07/11  7:36 AM      Component Value Range Comment   Glucose-Capillary 154 (*) 70 - 99 (mg/dL)    Comment 1 Notify RN     BASIC METABOLIC PANEL      Status: Abnormal   Collection Time   03/07/11  8:53 AM      Component Value Range Comment   Sodium 143  135 - 145 (mEq/L)    Potassium 3.3 (*) 3.5 - 5.1 (mEq/L)    Chloride 106  96 - 112 (mEq/L)    CO2 27  19 - 32 (mEq/L)    Glucose, Bld 188 (*) 70 - 99 (mg/dL)    BUN 11  6 - 23 (mg/dL)    Creatinine, Ser 9.52  0.50 - 1.10 (mg/dL)    Calcium 8.0 (*) 8.4 - 10.5 (mg/dL)    GFR calc non Af Amer 61 (*) >90 (mL/min)    GFR calc Af Amer 71 (*) >90 (mL/min)   CBC     Status: Normal   Collection Time   03/07/11  8:53 AM      Component Value Range Comment   WBC 4.1  4.0 - 10.5 (K/uL)    RBC 4.53  3.87 - 5.11 (MIL/uL)    Hemoglobin 12.1  12.0 - 15.0 (g/dL)    HCT 84.1  32.4 - 40.1 (%)    MCV 83.7  78.0 - 100.0 (fL)    MCH 26.7  26.0 - 34.0 (pg)    MCHC 31.9  30.0 - 36.0 (g/dL)    RDW 02.7  25.3 - 66.4 (%)    Platelets 228  150 - 400 (K/uL)   CARDIAC PANEL(CRET KIN+CKTOT+MB+TROPI)     Status: Abnormal   Collection Time   03/07/11  9:00 AM      Component Value Range Comment   Total CK 773 (*) 7 - 177 (U/L)    CK, MB 9.4 (*) 0.3 - 4.0 (ng/mL) CRITICAL VALUE NOTED.  VALUE IS CONSISTENT WITH PREVIOUSLY REPORTED AND CALLED VALUE.   Troponin I 2.96 (*) <0.30 (ng/mL)    Relative Index 1.2  0.0 - 2.5    GLUCOSE, CAPILLARY     Status: Abnormal   Collection Time   03/07/11 11:39 AM      Component Value Range Comment   Glucose-Capillary 275 (*) 70 - 99 (mg/dL)    Comment 1 Notify RN     GLUCOSE, CAPILLARY     Status: Abnormal   Collection Time   03/07/11  4:03 PM      Component Value Range Comment   Glucose-Capillary 292 (*) 70 - 99 (mg/dL)    Comment 1 Notify  RN     GLUCOSE, CAPILLARY     Status: Abnormal   Collection Time   03/07/11  8:01 PM      Component Value Range Comment   Glucose-Capillary 157 (*) 70 - 99 (mg/dL)    Comment 1 Notify RN      Comment 2 Documented in Chart     CARDIAC PANEL(CRET KIN+CKTOT+MB+TROPI)     Status: Abnormal   Collection Time   03/07/11  9:33 PM       Component Value Range Comment   Total CK 867 (*) 7 - 177 (U/L)    CK, MB 8.4 (*) 0.3 - 4.0 (ng/mL) CRITICAL VALUE NOTED.  VALUE IS CONSISTENT WITH PREVIOUSLY REPORTED AND CALLED VALUE.   Troponin I 2.86 (*) <0.30 (ng/mL)    Relative Index 1.0  0.0 - 2.5    GLUCOSE, CAPILLARY     Status: Abnormal   Collection Time   03/07/11 11:40 PM      Component Value Range Comment   Glucose-Capillary 131 (*) 70 - 99 (mg/dL)    Comment 1 Notify RN      Comment 2 Documented in Chart     GLUCOSE, CAPILLARY     Status: Abnormal   Collection Time   03/08/11  3:34 AM      Component Value Range Comment   Glucose-Capillary 117 (*) 70 - 99 (mg/dL)    Comment 1 Notify RN      Comment 2 Documented in Chart     GLUCOSE, CAPILLARY     Status: Abnormal   Collection Time   03/08/11  7:39 AM      Component Value Range Comment   Glucose-Capillary 126 (*) 70 - 99 (mg/dL)    Comment 1 Notify RN      Comment 2 Documented in Chart     GLUCOSE, CAPILLARY     Status: Abnormal   Collection Time   03/08/11 12:12 PM      Component Value Range Comment   Glucose-Capillary 185 (*) 70 - 99 (mg/dL)    Comment 1 Notify RN      Comment 2 Documented in Chart        Current Discharge Medication List    START taking these medications   Details  acetaminophen (TYLENOL) 325 MG tablet Take 2 tablets (650 mg total) by mouth every 6 (six) hours as needed for pain or fever. Qty: 30 tablet    aspirin 81 MG chewable tablet Chew 4 tablets (324 mg total) by mouth daily.    isosorbide mononitrate (IMDUR) 30 MG 24 hr tablet Take 1 tablet (30 mg total) by mouth daily. Refills: 0    metoprolol tartrate (LOPRESSOR) 25 MG tablet Take 1 tablet (25 mg total) by mouth 2 (two) times daily.      CONTINUE these medications which have CHANGED   Details  insulin glargine (LANTUS) 100 UNIT/ML injection Inject 30 Units into the skin at bedtime. Qty: 10 mL, Refills: 0    oxyCODONE (OXY IR/ROXICODONE) 5 MG immediate release tablet Take 2  tablets (10 mg total) by mouth every 4 (four) hours as needed. For pain Qty: 30 tablet, Refills: 0      CONTINUE these medications which have NOT CHANGED   Details  amLODipine (NORVASC) 5 MG tablet Take 5 mg by mouth daily.      bisacodyl (DULCOLAX) 10 MG suppository Place 10 mg rectally daily as needed. For constipation; give if not relieved by milk of magnesia  cilostazol (PLETAL) 100 MG tablet Take 100 mg by mouth 2 (two) times daily.      cloNIDine (CATAPRES) 0.1 MG tablet Take 0.1 mg by mouth 3 (three) times daily.      fentaNYL (DURAGESIC - DOSED MCG/HR) 50 MCG/HR Place 1 patch onto the skin every 3 (three) days.      gabapentin (NEURONTIN) 300 MG capsule Take 300 mg by mouth 3 (three) times daily.      !! insulin aspart (NOVOLOG) 100 UNIT/ML injection Inject 5 Units into the skin daily with supper. Give if patient eats >50% of meals     !! insulin aspart (NOVOLOG) 100 UNIT/ML injection Inject 4-12 Units into the skin 3 (three) times daily before meals. CBG 150-200= 4 units; 201-250= 6 units; 251-300= 8 units; 301-350= 10 units; 351-400= 12 units; if CBG<60 or >400 call MD     levothyroxine (SYNTHROID, LEVOTHROID) 125 MCG tablet Take 125 mcg by mouth daily.      loratadine (CLARITIN) 10 MG tablet Take 10 mg by mouth daily. For allergies     LORazepam (ATIVAN) 0.5 MG tablet Take 0.5 mg by mouth every 8 (eight) hours as needed. For anxiety     magnesium hydroxide (MILK OF MAGNESIA) 400 MG/5ML suspension Take 30 mLs by mouth daily as needed. For constipation     metaxalone (SKELAXIN) 800 MG tablet Take 800 mg by mouth 3 (three) times daily.      methocarbamol (ROBAXIN) 500 MG tablet Take 500 mg by mouth every 6 (six) hours as needed. For muscle spasms     nitroGLYCERIN (NITROSTAT) 0.4 MG SL tablet Place 0.4 mg under the tongue every 5 (five) minutes as needed. For chest pain     polyethylene glycol (MIRALAX / GLYCOLAX) packet Take 17 g by mouth daily. Mix in 8 ounces of  water; for constipation     pravastatin (PRAVACHOL) 20 MG tablet Take 20 mg by mouth daily.      senna (SENOKOT) 8.6 MG TABS Take 2 tablets by mouth daily.      tolterodine (DETROL LA) 4 MG 24 hr capsule Take 4 mg by mouth 2 (two) times daily. Takes at 8 AM and 4 PM     zolpidem (AMBIEN) 10 MG tablet Take 10 mg by mouth at bedtime as needed. For sleep      !! - Potential duplicate medications found. Please discuss with provider.        Hospital Course: Principal Problem:  *Vomiting, persistent, in adult Ms. Wilkowski presented with nausea and vomiting. No clear-cut source of the vomiting was ever identified. A KUB was accomplished which revealed no evidence of obstruction. A urinalysis was not consistent with pyelonephritis or urinary tract infection. At date of discharge patient's symptoms had resolved and she was tolerating a solid diet. It is felt this patient's gastrointestinal symptoms are related to acute gastroenteritis.  Active Problems: Fever of unknown origin This patient presented with fever maximum 103F. Urine culture showed no evidence of urinary tract infection. Chest x-ray showed no evidence of pneumonia. It is felt that her fever was related to her gastroenteritis symptoms.   Cardiac enzymes elevated The patient does not have a significant past medical history consisting of cardiovascular disease other than peripheral vascular disease. As a precaution the admitting physician did order cardiac isoenzymes. She was noted to have an elevation of her troponin I have to 3.95 with associated elevated CPK and MB with subtle nonspecific ST segment changes in her inferior lateral leads. A  cardiologist consult was obtained with Dr. Eldridge Dace. He determined that the cardiac enzymes were likely elevated related to demand ischemia from acute illness. He did recommend pursuing a echocardiogram. He reported that if the echocardiogram showed no regional wall motion abnormalities, additional  ischemic workup was not indicated. Patient's echocardiogram showed normal LV function and no evidence of regional wall motion abnormalities. It is important to note this patient did not have any chest pain or shortness of breath this admission. Because of her multiple risk factors for coronary artery disease, medical therapy for her  coronary artery disease was initiated this admission. She has been started on Imdur, full dose aspirin, and Lopressor.   DIABETES MELLITUS, TYPE II Serum glucoses remained well controlled this admission. She did have an episode of hyperglycemia related to dextrose and IV fluids which resolved after discontinuation of dextrose related fluids. Due to poor by mouth intake related to nausea and vomiting at presentation her Lantus dose was decreased from baseline 57 units daily to 30 units daily and will remain at this level at date of discharge. Recommend continuing sliding-scale insulin as prior to admission. Please see discharge medication list.  Hypertensive urgency Her blood pressure was somewhat elevated at presentation likely related to nausea vomiting and inability to keep usual  blood pressure medications on stomach. Once nausea and vomiting resolved and usual antihypertensive medications were resumed her blood pressure was relatively well controlled at date of discharge.    URINARY INCONTINENCE Detrol was resumed at this hospitalization.   Sinus tachycardia  This problem was related to nausea and vomiting, fever and associated dehydration as well as significant pain in the right lower extremity which is chronic. Tachycardia has resolved.  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  S/P AKA (above knee amputation) unilateral  History of DVT (deep vein thrombosis)      Day of  Discharge BP 186/68  Pulse 85  Temp(Src) 98.9 F (37.2 C) (Oral)  Resp 21  Ht 5\' 4"  (1.626 m)  Wt 80.3 kg (177 lb 0.5 oz)  BMI 30.39 kg/m2  SpO2 93%  Physical Exam: General: No acute respiratory distress  Lungs: Clear to auscultation bilaterally without wheezes or crackles  Cardiovascular: Regular rate and rhythm without murmur gallop or rub normal S1 and S2  Abdomen: Nontender, nondistended, soft, bowel sounds positive, no rebound, no ascites, no appreciable mass, no further N/V/D and tolerating solid diet on date of discharge. Extremities: No significant cyanosis, clubbing, or edema of the left lower extremity. 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  Cutaneous: I see no evidence of significant wounds on the left lower extremity or the right AKA stump   Follow-up: Dr. Leanord Hawking as needed- SNF physician   Disposition:  Clinically stable- will transport via ambulance to nursing facility.

## 2011-03-08 NOTE — Progress Notes (Signed)
Clinical Social Worker completed pt psychosocial assessment, see in pt shadow chart. Pt plans to discharge to [facility] when medically stable. Clinical Social Worker left message with pt son and pt skilled nursing facility. Clinical Social worker completed FL2 for M.D. Signature in pt shadow chart.    Catha Gosselin, [place]  [phone] 11:01am

## 2011-03-08 NOTE — Plan of Care (Signed)
Problem: Phase I Progression Outcomes Goal: Initial discharge plan identified Pt. Wants to return to her SNF. Md's aware and discharge is in progress

## 2011-03-08 NOTE — Progress Notes (Signed)
Clinical social worker assisted with patient dc plans to return to skilled nursing facility. Patient transportation to be provided by [facility]  with patient chart copy. No further Clinical Social Work needs. Signing [address]    Catha Gosselin, [place]   [phone]  15:06

## 2011-03-08 NOTE — Progress Notes (Signed)
Inpatient Diabetes Program Recommendations  AACE/ADA: New Consensus Statement on Inpatient Glycemic Control (2009)  Target Ranges:  Prepandial:   less than 140 mg/dL      Peak postprandial:   less than 180 mg/dL (1-2 hours)      Critically ill patients:  140 - 180 mg/dL   Reason for Visit:   Inpatient Diabetes Program Recommendations Correction (SSI): Change CBGs and Novolog Moderate correction scale to tid ac + HS (pt now on PO diet) HgbA1C: Check A1C to assess prior hospital glucose control. Diet: Change diet to Carbohydrate Modified Medium diet  Note:

## 2011-03-08 NOTE — Progress Notes (Signed)
Pt d/c to nursing facility [facility] - send with clothes son brought to bedside - [facility] picked pt up from room - Son at bedside -

## 2011-03-12 LAB — CULTURE, BLOOD (ROUTINE X 2): Culture  Setup Time: 201211062056

## 2011-03-20 NOTE — Discharge Summary (Signed)
Patient examined and chart reviewed. She is stable for discharge to SNF. Agree with above discharge summary.

## 2012-02-09 ENCOUNTER — Inpatient Hospital Stay (HOSPITAL_COMMUNITY)
Admission: EM | Admit: 2012-02-09 | Discharge: 2012-02-12 | DRG: 292 | Disposition: A | Discharge status: Skilled Nursing Facility | Payer: Medicare Other | Attending: Internal Medicine | Admitting: Internal Medicine

## 2012-02-09 ENCOUNTER — Emergency Department (HOSPITAL_COMMUNITY): Payer: Medicare Other

## 2012-02-09 ENCOUNTER — Encounter (HOSPITAL_COMMUNITY): Payer: Self-pay | Admitting: Nurse Practitioner

## 2012-02-09 DIAGNOSIS — F3289 Other specified depressive episodes: Secondary | ICD-10-CM | POA: Diagnosis present

## 2012-02-09 DIAGNOSIS — I160 Hypertensive urgency: Secondary | ICD-10-CM | POA: Diagnosis present

## 2012-02-09 DIAGNOSIS — Z794 Long term (current) use of insulin: Secondary | ICD-10-CM

## 2012-02-09 DIAGNOSIS — I5031 Acute diastolic (congestive) heart failure: Principal | ICD-10-CM | POA: Diagnosis present

## 2012-02-09 DIAGNOSIS — G40909 Epilepsy, unspecified, not intractable, without status epilepticus: Secondary | ICD-10-CM | POA: Diagnosis present

## 2012-02-09 DIAGNOSIS — Z86718 Personal history of other venous thrombosis and embolism: Secondary | ICD-10-CM

## 2012-02-09 DIAGNOSIS — R569 Unspecified convulsions: Secondary | ICD-10-CM | POA: Diagnosis present

## 2012-02-09 DIAGNOSIS — N289 Disorder of kidney and ureter, unspecified: Secondary | ICD-10-CM | POA: Diagnosis not present

## 2012-02-09 DIAGNOSIS — Z8719 Personal history of other diseases of the digestive system: Secondary | ICD-10-CM

## 2012-02-09 DIAGNOSIS — Z8614 Personal history of Methicillin resistant Staphylococcus aureus infection: Secondary | ICD-10-CM

## 2012-02-09 DIAGNOSIS — I6350 Cerebral infarction due to unspecified occlusion or stenosis of unspecified cerebral artery: Secondary | ICD-10-CM

## 2012-02-09 DIAGNOSIS — F329 Major depressive disorder, single episode, unspecified: Secondary | ICD-10-CM | POA: Diagnosis present

## 2012-02-09 DIAGNOSIS — I639 Cerebral infarction, unspecified: Secondary | ICD-10-CM

## 2012-02-09 DIAGNOSIS — Z79899 Other long term (current) drug therapy: Secondary | ICD-10-CM

## 2012-02-09 DIAGNOSIS — E1149 Type 2 diabetes mellitus with other diabetic neurological complication: Secondary | ICD-10-CM | POA: Diagnosis present

## 2012-02-09 DIAGNOSIS — I129 Hypertensive chronic kidney disease with stage 1 through stage 4 chronic kidney disease, or unspecified chronic kidney disease: Secondary | ICD-10-CM | POA: Diagnosis present

## 2012-02-09 DIAGNOSIS — I69920 Aphasia following unspecified cerebrovascular disease: Secondary | ICD-10-CM

## 2012-02-09 DIAGNOSIS — E119 Type 2 diabetes mellitus without complications: Secondary | ICD-10-CM | POA: Diagnosis present

## 2012-02-09 DIAGNOSIS — E039 Hypothyroidism, unspecified: Secondary | ICD-10-CM | POA: Diagnosis present

## 2012-02-09 DIAGNOSIS — I10 Essential (primary) hypertension: Secondary | ICD-10-CM | POA: Diagnosis present

## 2012-02-09 DIAGNOSIS — R32 Unspecified urinary incontinence: Secondary | ICD-10-CM | POA: Diagnosis present

## 2012-02-09 DIAGNOSIS — I69922 Dysarthria following unspecified cerebrovascular disease: Secondary | ICD-10-CM

## 2012-02-09 DIAGNOSIS — S78119A Complete traumatic amputation at level between unspecified hip and knee, initial encounter: Secondary | ICD-10-CM

## 2012-02-09 DIAGNOSIS — Z7982 Long term (current) use of aspirin: Secondary | ICD-10-CM

## 2012-02-09 DIAGNOSIS — I739 Peripheral vascular disease, unspecified: Secondary | ICD-10-CM | POA: Diagnosis present

## 2012-02-09 DIAGNOSIS — D649 Anemia, unspecified: Secondary | ICD-10-CM | POA: Diagnosis present

## 2012-02-09 DIAGNOSIS — N189 Chronic kidney disease, unspecified: Secondary | ICD-10-CM | POA: Diagnosis present

## 2012-02-09 DIAGNOSIS — I69959 Hemiplegia and hemiparesis following unspecified cerebrovascular disease affecting unspecified side: Secondary | ICD-10-CM

## 2012-02-09 DIAGNOSIS — N179 Acute kidney failure, unspecified: Secondary | ICD-10-CM | POA: Diagnosis present

## 2012-02-09 DIAGNOSIS — I509 Heart failure, unspecified: Secondary | ICD-10-CM | POA: Diagnosis present

## 2012-02-09 DIAGNOSIS — Z66 Do not resuscitate: Secondary | ICD-10-CM | POA: Diagnosis present

## 2012-02-09 HISTORY — DX: Cerebral infarction, unspecified: I63.9

## 2012-02-09 HISTORY — DX: Essential (primary) hypertension: I10

## 2012-02-09 LAB — BASIC METABOLIC PANEL
CO2: 28 mEq/L (ref 19–32)
GFR calc non Af Amer: 46 mL/min — ABNORMAL LOW (ref >90)
Glucose, Bld: 67 mg/dL — ABNORMAL LOW (ref 70–99)
Potassium: 3.8 mEq/L (ref 3.5–5.1)
Sodium: 141 mEq/L (ref 135–145)

## 2012-02-09 LAB — URINALYSIS, ROUTINE W REFLEX MICROSCOPIC
Leukocytes, UA: NEGATIVE
Nitrite: NEGATIVE
Specific Gravity, Urine: 1.023 (ref 1.005–1.030)
Urobilinogen, UA: 0.2 mg/dL (ref 0.0–1.0)
pH: 6 (ref 5.0–8.0)

## 2012-02-09 LAB — URINE MICROSCOPIC-ADD ON

## 2012-02-09 LAB — CBC
Hemoglobin: 10.5 g/dL — ABNORMAL LOW (ref 12.0–15.0)
MCHC: 31.4 g/dL (ref 30.0–36.0)
Platelets: 248 10*3/uL (ref 150–400)
RBC: 3.91 MIL/uL (ref 3.87–5.11)

## 2012-02-09 LAB — GLUCOSE, CAPILLARY: Glucose-Capillary: 169 mg/dL — ABNORMAL HIGH (ref 70–99)

## 2012-02-09 LAB — PRO B NATRIURETIC PEPTIDE: Pro B Natriuretic peptide (BNP): 836.8 pg/mL — ABNORMAL HIGH (ref 0–125)

## 2012-02-09 MED ORDER — FUROSEMIDE 10 MG/ML IJ SOLN
40.0000 mg | Freq: Once | INTRAMUSCULAR | Status: AC
  Administered 2012-02-09: 40 mg via INTRAVENOUS
  Filled 2012-02-09: qty 4

## 2012-02-09 MED ORDER — SENNA 8.6 MG PO TABS
2.0000 | ORAL_TABLET | Freq: Every day | ORAL | Status: DC
  Administered 2012-02-10 – 2012-02-12 (×3): 17.2 mg via ORAL
  Filled 2012-02-09 (×4): qty 2

## 2012-02-09 MED ORDER — POLYETHYLENE GLYCOL 3350 17 G PO PACK
17.0000 g | PACK | Freq: Every day | ORAL | Status: DC
  Administered 2012-02-10 – 2012-02-11 (×2): 17 g via ORAL
  Filled 2012-02-09 (×4): qty 1

## 2012-02-09 MED ORDER — ASPIRIN 81 MG PO CHEW
324.0000 mg | CHEWABLE_TABLET | Freq: Every day | ORAL | Status: DC
  Administered 2012-02-09 – 2012-02-12 (×3): 324 mg via ORAL
  Filled 2012-02-09: qty 4
  Filled 2012-02-09: qty 1
  Filled 2012-02-09 (×2): qty 4

## 2012-02-09 MED ORDER — FENTANYL 25 MCG/HR TD PT72: 50.0000 ug | MEDICATED_PATCH | TRANSDERMAL | Status: DC

## 2012-02-09 MED ORDER — METAXALONE 800 MG PO TABS
800.0000 mg | ORAL_TABLET | Freq: Three times a day (TID) | ORAL | Status: DC
  Administered 2012-02-09 – 2012-02-12 (×8): 800 mg via ORAL
  Filled 2012-02-09 (×10): qty 1

## 2012-02-09 MED ORDER — SODIUM CHLORIDE 0.9 % IJ SOLN
3.0000 mL | Freq: Two times a day (BID) | INTRAMUSCULAR | Status: DC
  Administered 2012-02-09 – 2012-02-11 (×5): 3 mL via INTRAVENOUS

## 2012-02-09 MED ORDER — ISOSORBIDE MONONITRATE ER 30 MG PO TB24
30.0000 mg | ORAL_TABLET | Freq: Every day | ORAL | Status: DC
  Administered 2012-02-09 – 2012-02-12 (×4): 30 mg via ORAL
  Filled 2012-02-09 (×4): qty 1

## 2012-02-09 MED ORDER — INSULIN ASPART 100 UNIT/ML ~~LOC~~ SOLN: 5.0000 [IU] | Freq: Every day | SUBCUTANEOUS | Status: DC

## 2012-02-09 MED ORDER — METOPROLOL TARTRATE 25 MG PO TABS
25.0000 mg | ORAL_TABLET | Freq: Two times a day (BID) | ORAL | Status: DC
  Administered 2012-02-09 – 2012-02-12 (×6): 25 mg via ORAL
  Filled 2012-02-09 (×7): qty 1

## 2012-02-09 MED ORDER — INSULIN ASPART 100 UNIT/ML ~~LOC~~ SOLN
0.0000 [IU] | SUBCUTANEOUS | Status: AC
  Administered 2012-02-10: 2 [IU] via SUBCUTANEOUS

## 2012-02-09 MED ORDER — FUROSEMIDE 10 MG/ML IJ SOLN
20.0000 mg | Freq: Two times a day (BID) | INTRAMUSCULAR | Status: DC
  Administered 2012-02-09 – 2012-02-11 (×4): 20 mg via INTRAVENOUS
  Filled 2012-02-09 (×5): qty 2

## 2012-02-09 MED ORDER — INSULIN GLARGINE 100 UNIT/ML ~~LOC~~ SOLN: 50.0000 [IU] | Freq: Every day | SUBCUTANEOUS | Status: DC

## 2012-02-09 MED ORDER — ATORVASTATIN CALCIUM 40 MG PO TABS
40.0000 mg | ORAL_TABLET | Freq: Every day | ORAL | Status: DC
  Administered 2012-02-09 – 2012-02-11 (×3): 40 mg via ORAL
  Filled 2012-02-09 (×4): qty 1

## 2012-02-09 MED ORDER — DEXTROSE 50 % IV SOLN
1.0000 | Freq: Once | INTRAVENOUS | Status: AC
  Administered 2012-02-09: 50 mL via INTRAVENOUS
  Filled 2012-02-09: qty 50

## 2012-02-09 MED ORDER — LORATADINE 10 MG PO TABS
10.0000 mg | ORAL_TABLET | Freq: Every day | ORAL | Status: DC
  Administered 2012-02-09 – 2012-02-12 (×4): 10 mg via ORAL
  Filled 2012-02-09 (×4): qty 1

## 2012-02-09 MED ORDER — LORAZEPAM 0.5 MG PO TABS: 0.5000 mg | ORAL_TABLET | Freq: Three times a day (TID) | ORAL | Status: DC | PRN

## 2012-02-09 MED ORDER — LOSARTAN POTASSIUM 25 MG PO TABS
25.0000 mg | ORAL_TABLET | Freq: Two times a day (BID) | ORAL | Status: DC
  Administered 2012-02-09 – 2012-02-12 (×6): 25 mg via ORAL
  Filled 2012-02-09 (×7): qty 1

## 2012-02-09 MED ORDER — FESOTERODINE FUMARATE ER 4 MG PO TB24
4.0000 mg | ORAL_TABLET | Freq: Every day | ORAL | Status: DC
  Administered 2012-02-09 – 2012-02-12 (×4): 4 mg via ORAL
  Filled 2012-02-09 (×4): qty 1

## 2012-02-09 MED ORDER — GABAPENTIN 300 MG PO CAPS
300.0000 mg | ORAL_CAPSULE | Freq: Three times a day (TID) | ORAL | Status: DC
  Administered 2012-02-09 – 2012-02-12 (×8): 300 mg via ORAL
  Filled 2012-02-09 (×10): qty 1

## 2012-02-09 MED ORDER — CILOSTAZOL 100 MG PO TABS
100.0000 mg | ORAL_TABLET | Freq: Two times a day (BID) | ORAL | Status: DC
  Administered 2012-02-09 – 2012-02-12 (×6): 100 mg via ORAL
  Filled 2012-02-09 (×7): qty 1

## 2012-02-09 MED ORDER — NITROGLYCERIN 0.4 MG SL SUBL: 0.4000 mg | SUBLINGUAL_TABLET | SUBLINGUAL | Status: DC | PRN

## 2012-02-09 MED ORDER — CLONIDINE HCL 0.1 MG PO TABS
0.1000 mg | ORAL_TABLET | Freq: Three times a day (TID) | ORAL | Status: DC
  Administered 2012-02-09 – 2012-02-12 (×8): 0.1 mg via ORAL
  Filled 2012-02-09 (×11): qty 1

## 2012-02-09 MED ORDER — LEVOTHYROXINE SODIUM 125 MCG PO TABS
125.0000 ug | ORAL_TABLET | Freq: Every day | ORAL | Status: DC
  Administered 2012-02-09 – 2012-02-12 (×4): 125 ug via ORAL
  Filled 2012-02-09 (×4): qty 1

## 2012-02-09 MED ORDER — INSULIN ASPART 100 UNIT/ML ~~LOC~~ SOLN
0.0000 [IU] | SUBCUTANEOUS | Status: DC
  Administered 2012-02-10: 2 [IU] via SUBCUTANEOUS

## 2012-02-09 MED ORDER — INSULIN ASPART 100 UNIT/ML ~~LOC~~ SOLN: 5.0000 [IU] | Freq: Three times a day (TID) | SUBCUTANEOUS | Status: DC

## 2012-02-09 MED ORDER — OXYCODONE HCL 5 MG PO TABS
10.0000 mg | ORAL_TABLET | ORAL | Status: DC | PRN
  Administered 2012-02-11: 10 mg via ORAL
  Filled 2012-02-09: qty 2

## 2012-02-09 MED ORDER — INSULIN ASPART 100 UNIT/ML ~~LOC~~ SOLN
5.0000 [IU] | Freq: Three times a day (TID) | SUBCUTANEOUS | Status: DC
  Administered 2012-02-10 – 2012-02-11 (×2): 10 [IU] via SUBCUTANEOUS
  Administered 2012-02-11: 15 [IU] via SUBCUTANEOUS

## 2012-02-09 MED ORDER — ZOLPIDEM TARTRATE 5 MG PO TABS
10.0000 mg | ORAL_TABLET | Freq: Every evening | ORAL | Status: DC | PRN
  Administered 2012-02-09 – 2012-02-11 (×3): 10 mg via ORAL
  Filled 2012-02-09 (×3): qty 2

## 2012-02-09 NOTE — H&P (Signed)
[facility] History and Physical  Allison Washington YNW:[id number] DOB: 1946/03/22 DOA: 02/09/2012  Referring physician: none PCP: No primary provider on file.  Specialists: none  Chief Complaint: DOE  HPI: Allison Washington is a 67 y.o. female  Pt lives at [facility] senior care and brought to ED due to DOE and at rest today. Pt denied any CP, cough, sputum, GI, GU symptoms. No increased swelling in the left leg. No headache or GERD or any other issues. 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 Her baseline creat was 1.22 with A1c 9.8 on 12/11/11. Another creast was 1.56 on 12/07/11. Her vitals at [facility] were 98.5/100/20/183-95. NH recorded SOB with o2 sats in 80 to 90%.   Review of Systems: The patient denies anorexia, fever, weight loss,, vision loss, decreased hearing, hoarseness, chest pain, syncope, peripheral edema, balance deficits, hemoptysis, abdominal pain, melena, hematochezia, severe indigestion/heartburn, hematuria, incontinence, genital sores, muscle weakness, suspicious skin lesions, transient blindness, difficulty walking, depression, unusual weight change, abnormal bleeding, enlarged lymph nodes, angioedema, and breast masses.    Past Medical History  Diagnosis Date  . 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 Diabetes mellitus     Prone to hypoglycemia  . Aphagia     Status post numerous  . Dysphagia   . Peripheral vascular disease, unspecified   . Arterial disease   . Depressive disorder   . Hypothyroid   . Seizures   . MRSA infection     History of MRSA in the left leg wound and sacrum in the past-has been seen by vascular surgery and declined any type of intervention including arteriogram  . Diverticulosis     GI bleed in 2007-s/p endoscopy and colonoscopy in 2007 s/p massive GIB consittent with  Diveerticulosis  . DVT (deep venous thrombosis)     Not on Coumadin  . Anemia     Chronic  . Non-smoker   . Stroke   . Hypertension    Past Surgical History  Procedure Date  . Above knee leg amputation   . Femoral-popliteal bypass graft     SH : lives at [facility], no recent alcohol, drug or tobacco use.   No Known Allergies  History reviewed. No pertinent family history. non contributory  Prior to Admission medications   Medication Sig Start Date End Date Taking? Authorizing Provider  aspirin 81 MG chewable tablet Chew 324 mg by mouth daily. 03/08/11 03/07/12 Yes Russella Dar, NP  atorvastatin (LIPITOR) 40 MG tablet Take 40 mg by mouth at bedtime.   Yes Historical Provider, MD  cilostazol (PLETAL) 100 MG tablet Take 100 mg by mouth 2 (two) times daily.     Yes Historical Provider, MD  cloNIDine (CATAPRES) 0.1 MG tablet Take 0.1 mg by mouth 3 (three) times daily.     Yes Historical Provider, MD  fentaNYL (DURAGESIC - DOSED MCG/HR) 50 MCG/HR Place 1 patch onto the skin every 3 (three) days. Last patch was placed on 02-07-12   Yes Historical Provider, MD  gabapentin (NEURONTIN) 300 MG capsule Take 300 mg by mouth 3 (three) times daily.     Yes Historical Provider, MD  insulin aspart (NOVOLOG) 100 UNIT/ML injection Inject 5 Units into the skin daily with supper. Give if patient eats >50% of meals    Yes Historical Provider, MD  insulin aspart (  NOVOLOG) 100 UNIT/ML injection Inject 5-15 Units into the skin 3 (three) times daily before meals. 5 units before meals and an additional 5 units if cbg is greater than or equal to 150 Give 10 units for cbg 150-300 15 units for cbg >300   Yes Historical Provider, MD  insulin glargine (LANTUS) 100 UNIT/ML injection Inject 50 Units into the skin at bedtime.   Yes Historical Provider, MD  isosorbide mononitrate (IMDUR) 30 MG 24 hr tablet Take 30 mg by mouth daily. 03/08/11 03/07/12 Yes Russella Dar, NP  levothyroxine (SYNTHROID, LEVOTHROID) 125 MCG  tablet Take 125 mcg by mouth daily.     Yes Historical Provider, MD  loratadine (CLARITIN) 10 MG tablet Take 10 mg by mouth daily. For allergies    Yes Historical Provider, MD  LORazepam (ATIVAN) 0.5 MG tablet Take 0.5 mg by mouth every 8 (eight) hours as needed. For anxiety    Yes Historical Provider, MD  losartan (COZAAR) 25 MG tablet Take 25 mg by mouth 2 (two) times daily.   Yes Historical Provider, MD  metaxalone (SKELAXIN) 800 MG tablet Take 800 mg by mouth 3 (three) times daily.     Yes Historical Provider, MD  metoprolol tartrate (LOPRESSOR) 25 MG tablet Take 25 mg by mouth 2 (two) times daily. 03/08/11 03/07/12 Yes Russella Dar, NP  oxyCODONE (OXY IR/ROXICODONE) 5 MG immediate release tablet Take 10 mg by mouth every 4 (four) hours as needed. For pain 03/08/11  Yes Russella Dar, NP  polyethylene glycol (MIRALAX / GLYCOLAX) packet Take 17 g by mouth daily. Mix in 8 ounces of water; for constipation    Yes Historical Provider, MD  senna (SENOKOT) 8.6 MG TABS Take 2 tablets by mouth daily.     Yes Historical Provider, MD  tolterodine (DETROL LA) 4 MG 24 hr capsule Take 4 mg by mouth 2 (two) times daily. Takes at 8 AM and 4 PM    Yes Historical Provider, MD  zolpidem (AMBIEN) 10 MG tablet Take 10 mg by mouth at bedtime as needed. For sleep    Yes Historical Provider, MD  nitroGLYCERIN (NITROSTAT) 0.4 MG SL tablet Place 0.4 mg under the tongue every 5 (five) minutes as needed. For chest pain     Historical Provider, MD   Physical Exam: Filed Vitals:   02/09/12 0929 02/09/12 1307 02/09/12 1550  BP: 160/66 179/70 170/97  Pulse:  85 97  Temp: 98.3 F (36.8 C)    TempSrc: Oral    Resp: 20 15 16   SpO2: 92% 94% 95%     General:  A, O x 3, Hard to understand due to poor speech from stroke, NAD  Eyes: PEERLA, EOMI  ENT: OMM, thick tongue  Neck: no JVD, thick nec  Cardiovascular: S1 S2 reg, no m/r/g  Respiratory: basal crackles anteriorly, good BAE  Abdomen: NT, ND, BS +  Skin:  no rash  Musculoskeletal: lying in bed  Psychiatric: good mood, no anxiety  Neurologic: good ROM, strength, sensations and reflexes  Left LE with healing wound in the dressing  Labs on Admission:  Basic Metabolic Panel:  Lab 02/09/12 1308  NA 141  K 3.8  CL 104  CO2 28  GLUCOSE 67*  BUN 11  CREATININE 1.22*  CALCIUM 9.0  MG --  PHOS --   Liver Function Tests: No results found for this basename: AST:5,ALT:5,ALKPHOS:5,BILITOT:5,PROT:5,ALBUMIN:5 in the last 168 hours No results found for this basename: LIPASE:5,AMYLASE:5 in the last 168 hours No results  found for this basename: AMMONIA:5 in the last 168 hours CBC:  Lab 02/09/12 1001  WBC 8.5  NEUTROABS --  HGB 10.5*  HCT 33.4*  MCV 85.4  PLT 248   Cardiac Enzymes:  Lab 02/09/12 1253  CKTOTAL --  CKMB --  CKMBINDEX --  TROPONINI <0.30    BNP (last 3 results)  Basename 02/09/12 1008  PROBNP 836.8*   CBG:  Lab 02/09/12 1306 02/09/12 1144  GLUCAP 121* 169*    Radiological Exams on Admission: Dg Chest 2 View  02/09/2012  *RADIOLOGY REPORT*  Clinical Data: Facial and leg swelling.  CHEST - 2 VIEW  Comparison: Chest x-ray 03/08/2011.  Findings: Lung volumes are normal.  No consolidative airspace disease. There is cephalization of the pulmonary vasculature and slight indistinctness of the interstitial markings suggestive of mild pulmonary edema.  Mild cardiomegaly. The patient is rotated to the right on today's exam, resulting in distortion of the mediastinal contours and reduced diagnostic sensitivity and specificity for mediastinal pathology.  Atherosclerosis in the thoracic aorta.  IMPRESSION: 1.  Findings, as above, suggestive of very mild congestive heart failure. 2.  Atherosclerosis.   Original Report Authenticated By: Florencia Reasons, M.D.     EKG: Independently reviewed.  Assessment/Plan Principal Problem:  *CHF (congestive heart failure) Active Problems:  ARF (acute renal failure)  DIABETES  MELLITUS, TYPE II  HYPERTENSION  SEIZURE DISORDER  URINARY INCONTINENCE  CVA (cerebral vascular accident)   1. Acute onset CHF based on CXR and her symptoms (DOE and at rest). Will need Echo, Cardiology consult, Lasix IV, CHF education and outpt management at SNF 2. ARF on CKD : will need to balance with lasix and monitor closely with BMP 3. H/o CVA with sequelae : PT/OT and speech therapy consult 4. DM II : SSI and home meds 5. Right BKA : PT/OT 6. Left leg wound, healing well : wound care to follow 7. HTN : cont home meds and monitor  Code Status: DNR/DNI Family Communication: none Disposition Plan: possible 3 day stay  Time spent: 1 hours  Maxten Shuler V. [facility] Pager [phone]  If 7PM-7AM, please contact night-coverage [email or web address] Password [email or web address] 02/09/2012, 4:18 PM

## 2012-02-09 NOTE — ED Notes (Signed)
From [facility]: swelling of hands and feet; hx. Of stroke; lgs. Cl/b. Htn.

## 2012-02-09 NOTE — ED Notes (Signed)
[facility] gave her benadryl 25 mg po prior to EMS arrival.

## 2012-02-09 NOTE — ED Provider Notes (Signed)
History     CSN: [id number]  Arrival date & time 02/09/12  1478   First MD Initiated Contact with Patient 02/09/12 [phone]      Chief Complaint  Patient presents with  . Facial Swelling  . Leg Swelling    (Consider location/radiation/quality/duration/timing/severity/associated sxs/prior treatment) HPI A LEVEL 5 CAVEAT PERTAINS DUE TO APHASIA AND POOR HISTORIAN Pt presents from NH with c/o swelling of face/arms and left leg.  She has a hx of right leg amputation.  She states symptoms have been going on for the past week.  She denies shortness of breath.  She has multiple medical problems including prior CVA, DM, cellulitis of left leg.  She denies fever/chills, no cough, no difficulty breathing.    Past Medical History  Diagnosis Date  . 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 Diabetes mellitus     Prone to hypoglycemia  . Aphagia     Status post numerous  . Dysphagia   . Peripheral vascular disease, unspecified   . Arterial disease   . Depressive disorder   . Hypothyroid   . Seizures   . MRSA infection     History of MRSA in the left leg wound and sacrum in the past-has been seen by vascular surgery and declined any type of intervention including arteriogram  . Diverticulosis     GI bleed in 2007-s/p endoscopy and colonoscopy in 2007 s/p massive GIB consittent with Diveerticulosis  . DVT (deep venous thrombosis)     Not on Coumadin  . Anemia     Chronic  . Non-smoker   . Stroke     Past Surgical History  Procedure Date  . Above knee leg amputation   . Femoral-popliteal bypass graft     History reviewed. No pertinent family history.  History  Substance Use Topics  . Smoking status: Never Smoker   . Smokeless tobacco: Not on file  . Alcohol Use: No    OB History    Grav Para Term Preterm Abortions TAB SAB Ect Mult Living                 Review of Systems UNABLE TO OBTAIN ROS DUE TO LEVEL 5 CAVEAT  Allergies  Review of patient's allergies indicates no known allergies.  Home Medications   Current Outpatient Rx  Name Route Sig Dispense Refill  . ASPIRIN 81 MG PO CHEW Oral Chew 324 mg by mouth daily.    . ATORVASTATIN CALCIUM 40 MG PO TABS Oral Take 40 mg by mouth at bedtime.    Marland Kitchen CILOSTAZOL 100 MG PO TABS Oral Take 100 mg by mouth 2 (two) times daily.      Marland Kitchen CLONIDINE HCL 0.1 MG PO TABS Oral Take 0.1 mg by mouth 3 (three) times daily.      . FENTANYL 50 MCG/HR TD PT72 Transdermal Place 1 patch onto the skin every 3 (three) days. Last patch was placed on 02-07-12    . GABAPENTIN 300 MG PO CAPS Oral Take 300 mg by mouth 3 (three) times daily.      . INSULIN ASPART 100 UNIT/ML [place] SOLN Subcutaneous Inject 5 Units into the skin daily with supper. Give if patient eats >50% of meals     . INSULIN ASPART 100 UNIT/ML [place] SOLN Subcutaneous Inject 5-15 Units into the skin 3 (three) times daily before  meals. 5 units before meals and an additional 5 units if cbg is greater than or equal to 150 Give 10 units for cbg 150-300 15 units for cbg >300    . INSULIN GLARGINE 100 UNIT/ML [place] SOLN Subcutaneous Inject 50 Units into the skin at bedtime.    . ISOSORBIDE MONONITRATE ER 30 MG PO TB24 Oral Take 30 mg by mouth daily.    Marland Kitchen LEVOTHYROXINE SODIUM 125 MCG PO TABS Oral Take 125 mcg by mouth daily.      Marland Kitchen LORATADINE 10 MG PO TABS Oral Take 10 mg by mouth daily. For allergies     . LORAZEPAM 0.5 MG PO TABS Oral Take 0.5 mg by mouth every 8 (eight) hours as needed. For anxiety     . LOSARTAN POTASSIUM 25 MG PO TABS Oral Take 25 mg by mouth 2 (two) times daily.    Marland Kitchen METAXALONE 800 MG PO TABS Oral Take 800 mg by mouth 3 (three) times daily.      Marland Kitchen METOPROLOL TARTRATE 25 MG PO TABS Oral Take 25 mg by mouth 2 (two) times daily.    . OXYCODONE HCL 5 MG PO TABS Oral Take 10 mg by mouth every 4 (four) hours as needed. For pain    .  POLYETHYLENE GLYCOL 3350 PO PACK Oral Take 17 g by mouth daily. Mix in 8 ounces of water; for constipation     . SENNA 8.6 MG PO TABS Oral Take 2 tablets by mouth daily.      . TOLTERODINE TARTRATE ER 4 MG PO CP24 Oral Take 4 mg by mouth 2 (two) times daily. Takes at 8 AM and 4 PM     . ZOLPIDEM TARTRATE 10 MG PO TABS Oral Take 10 mg by mouth at bedtime as needed. For sleep     . NITROGLYCERIN 0.4 MG SL SUBL Sublingual Place 0.4 mg under the tongue every 5 (five) minutes as needed. For chest pain       BP 170/97  Pulse 97  Temp 98.3 F (36.8 C) (Oral)  Resp 16  SpO2 95% Vitals reviewed Physical Exam Physical Examination: General appearance - alert, well appearing, and in no distress Mental status - alert, oriented to person, place, and time Mouth - mucous membranes moist, pharynx normal without lesions Chest - clear to auscultation, no wheezes, rales or rhonchi, symmetric air entry, no increased respiratory effort Heart - normal rate, regular rhythm, normal S1, S2, no murmurs, rubs, clicks or gallops Abdomen - soft, nontender, nondistended, no masses or organomegaly Neurological - alert, oriented, aphasia, facial droop/old Extremities - peripheral pulses normal, no pedal edema, no clubbing or cyanosis Skin - normal coloration and turgor except left lower extremity with cellulitis- erythematous  ED Course  Procedures (including critical care time)   Date: 02/09/2012  Rate: 89  Rhythm: normal sinus rhythm  QRS Axis: normal  Intervals: normal  ST/T Wave abnormalities: nonspecific T wave changes  Conduction Disutrbances:none  Narrative Interpretation:   Old EKG Reviewed: unchanged compared to prior ekg of 03/07/11  3:11 PM  D/w [facility], pt to be admitted.  Findings of new onset CHF- no hx per chart review    Labs Reviewed  CBC - Abnormal; Notable for the following:    Hemoglobin 10.5 (*)     HCT 33.4 (*)     All other components within normal limits  BASIC  METABOLIC PANEL - Abnormal; Notable for the following:    Glucose, Bld 67 (*)  Creatinine, Ser 1.22 (*)     GFR calc non Af Amer 46 (*)     GFR calc Af Amer 53 (*)     All other components within normal limits  PRO B NATRIURETIC PEPTIDE - Abnormal; Notable for the following:    Pro B Natriuretic peptide (BNP) 836.8 (*)     All other components within normal limits  GLUCOSE, CAPILLARY - Abnormal; Notable for the following:    Glucose-Capillary 169 (*)     All other components within normal limits  GLUCOSE, CAPILLARY - Abnormal; Notable for the following:    Glucose-Capillary 121 (*)     All other components within normal limits  TROPONIN I  URINALYSIS, ROUTINE W REFLEX MICROSCOPIC  URINE CULTURE   Dg Chest 2 View  02/09/2012  *RADIOLOGY REPORT*  Clinical Data: Facial and leg swelling.  CHEST - 2 VIEW  Comparison: Chest x-ray 03/08/2011.  Findings: Lung volumes are normal.  No consolidative airspace disease. There is cephalization of the pulmonary vasculature and slight indistinctness of the interstitial markings suggestive of mild pulmonary edema.  Mild cardiomegaly. The patient is rotated to the right on today's exam, resulting in distortion of the mediastinal contours and reduced diagnostic sensitivity and specificity for mediastinal pathology.  Atherosclerosis in the thoracic aorta.  IMPRESSION: 1.  Findings, as above, suggestive of very mild congestive heart failure. 2.  Atherosclerosis.   Original Report Authenticated By: Florencia Reasons, M.D.      1. CHF (congestive heart failure)       MDM  Pt presents with c/o swelling of face and hands over the past week. Per NH report she has had some hypoxia in the 80s on room air.  Workup in the ED shows mild pulmonary edema on CXR, BNP somewhat elevated as well.  Pt given lasix in the ED and admitted to hospitalist service for further evaluation.          Ethelda Chick, MD 02/10/12 [phone]

## 2012-02-10 DIAGNOSIS — I10 Essential (primary) hypertension: Secondary | ICD-10-CM

## 2012-02-10 LAB — GLUCOSE, CAPILLARY
Glucose-Capillary: 82 mg/dL (ref 70–99)
Glucose-Capillary: 227 mg/dL — ABNORMAL HIGH (ref 70–99)
Glucose-Capillary: 233 mg/dL — ABNORMAL HIGH (ref 70–99)

## 2012-02-10 LAB — URINE CULTURE

## 2012-02-10 LAB — COMPREHENSIVE METABOLIC PANEL
Albumin: 2.9 g/dL — ABNORMAL LOW (ref 3.5–5.2)
BUN: 10 mg/dL (ref 6–23)
Calcium: 9.3 mg/dL (ref 8.4–10.5)
Creatinine, Ser: 1.07 mg/dL (ref 0.50–1.10)
GFR calc Af Amer: 62 mL/min — ABNORMAL LOW (ref >90)
Total Protein: 7.4 g/dL (ref 6.0–8.3)

## 2012-02-10 LAB — CBC
HCT: 36.8 % (ref 36.0–46.0)
MCH: 27 pg (ref 26.0–34.0)
MCHC: 31.5 g/dL (ref 30.0–36.0)
MCV: 85.6 fL (ref 78.0–100.0)
RDW: 13.9 % (ref 11.5–15.5)

## 2012-02-10 LAB — TROPONIN I: Troponin I: <0.3 ng/mL (ref <0.30)

## 2012-02-10 LAB — MRSA PCR SCREENING: MRSA by PCR: NEGATIVE

## 2012-02-10 LAB — MAGNESIUM: Magnesium: 1.9 mg/dL (ref 1.5–2.5)

## 2012-02-10 LAB — FOLATE: Folate: 19 ng/mL

## 2012-02-10 LAB — IRON AND TIBC
Saturation Ratios: 15 % — ABNORMAL LOW (ref 20–55)
TIBC: 267 ug/dL (ref 250–470)

## 2012-02-10 LAB — VITAMIN B12: Vitamin B-12: 392 pg/mL (ref 211–911)

## 2012-02-10 MED ORDER — POTASSIUM CHLORIDE CRYS ER 20 MEQ PO TBCR
40.0000 meq | EXTENDED_RELEASE_TABLET | ORAL | Status: AC
  Administered 2012-02-10 (×2): 40 meq via ORAL
  Filled 2012-02-10 (×2): qty 2

## 2012-02-10 MED ORDER — ENOXAPARIN SODIUM 30 MG/0.3ML ~~LOC~~ SOLN
30.0000 mg | SUBCUTANEOUS | Status: DC
  Administered 2012-02-10 – 2012-02-12 (×3): 30 mg via SUBCUTANEOUS
  Filled 2012-02-10 (×3): qty 0.3

## 2012-02-10 MED ORDER — INSULIN ASPART 100 UNIT/ML ~~LOC~~ SOLN
0.0000 [IU] | Freq: Three times a day (TID) | SUBCUTANEOUS | Status: DC
  Administered 2012-02-10 (×2): 8 [IU] via SUBCUTANEOUS
  Administered 2012-02-11: 4 [IU] via SUBCUTANEOUS
  Administered 2012-02-11: 16 [IU] via SUBCUTANEOUS

## 2012-02-10 NOTE — Progress Notes (Deleted)
D/c pending BM. Pt has had large BM without need of enema. Pt for d/c home

## 2012-02-10 NOTE — Progress Notes (Signed)
[facility] PROGRESS NOTE  Allison Washington WJX:[id number] DOB: 1945-12-07 DOA: 02/09/2012 PCP: No primary provider on file.  Assessment/Plan:  #1 acute CHF exacerbation Patient had presented with shortness of breath at rest. Clinical improvement. Patient with good diuresis. I/L. equal -3.8 L. Patient's weight today 79.4 kg from 79.8 kg. Cardiac enzymes are negative x2. 2-D echo is pending. Continue Lasix 20 mg IV every 12 hours. Follow.  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 Continue aspirin.  #3 type 2 diabetes CBGs ranging from 82-159. Continue Lantus. Sliding scale insulin. Continue Neurontin.  #4 hypothyroidism Continue home dose Synthroid  #5 hypertension Patient's systolic blood pressure in the 200s this morning however has just received her blood pressure medications. Patient currently asymptomatic. Continue Cozaar, IV Lasix, Lopressor, Imdur, clonidine.  #6 peripheral vascular disease Continue Pletal  #7 left leg superficial wound Continue current wound care.  #8 status post right BKA  #9 prophylaxis Lovenox for DVT prophylaxis.  Code Status: DNR Family Communication: Updated patient. No family at bedside. Disposition Plan: Back to NH when medically stable   Consultants:  None  Procedures:  CT abdomen and pelvis 02/08/2011  Antibiotics:  None  HPI/Subjective: Patient not had that her breathing has improved significantly. Patient eating breakfast. No complaints. Patient with aphasia.  Objective: Filed Vitals:   02/09/12 2248 02/10/12 0240 02/10/12 0425 02/10/12 0922  BP: 172/74 148/54 160/68 220/100  Pulse: 66 85 91   Temp: 98.7 F (37.1 C)  98.3 F (36.8 C)   TempSrc: Oral  Axillary   Resp: 20 18 18    Height:      Weight:   79.4 kg (175 lb 0.7 oz)   SpO2: 95% 94% 91%     Intake/Output Summary (Last 24 hours) at 02/10/12 0956 Last data filed at 02/10/12 0620  Gross per 24 hour  Intake    344 ml    Output   4175 ml  Net  -3831 ml   Filed Weights   02/09/12 1901 02/10/12 0425  Weight: 79.8 kg (175 lb 14.8 oz) 79.4 kg (175 lb 0.7 oz)    Exam:   General:  NAD. Aphasia  Cardiovascular: RRR. Status post right BKA. Left lower extremity with trace to 1+ edema. Superficial wound on the anterior shin.  Respiratory: CTAB  Abdomen: Soft/NT/ND/+BS  Data Reviewed: Basic Metabolic Panel:  Lab 02/09/12 2130  NA 141  K 3.8  CL 104  CO2 28  GLUCOSE 67*  BUN 11  CREATININE 1.22*  CALCIUM 9.0  MG --  PHOS --   Liver Function Tests: No results found for this basename: AST:5,ALT:5,ALKPHOS:5,BILITOT:5,PROT:5,ALBUMIN:5 in the last 168 hours No results found for this basename: LIPASE:5,AMYLASE:5 in the last 168 hours No results found for this basename: AMMONIA:5 in the last 168 hours CBC:  Lab 02/10/12 0910 02/09/12 1001  WBC 7.7 8.5  NEUTROABS -- --  HGB 11.6* 10.5*  HCT 36.8 33.4*  MCV 85.6 85.4  PLT 299 248   Cardiac Enzymes:  Lab 02/09/12 2302 02/09/12 1253  CKTOTAL -- --  CKMB -- --  CKMBINDEX -- --  TROPONINI <0.30 <0.30   BNP (last 3 results)  Basename 02/09/12 2300 02/09/12 1008  PROBNP 1096.0* 836.8*   CBG:  Lab 02/10/12 0738 02/10/12 0426 02/10/12 0131 02/09/12 2323 02/09/12 2051  GLUCAP 91 82 145* 159* 120*    No results found for this or any previous visit (from the past 240 hour(s)).   Studies: Dg Chest 2 View  02/09/2012  *  RADIOLOGY REPORT*  Clinical Data: Facial and leg swelling.  CHEST - 2 VIEW  Comparison: Chest x-ray 03/08/2011.  Findings: Lung volumes are normal.  No consolidative airspace disease. There is cephalization of the pulmonary vasculature and slight indistinctness of the interstitial markings suggestive of mild pulmonary edema.  Mild cardiomegaly. The patient is rotated to the right on today's exam, resulting in distortion of the mediastinal contours and reduced diagnostic sensitivity and specificity for mediastinal pathology.   Atherosclerosis in the thoracic aorta.  IMPRESSION: 1.  Findings, as above, suggestive of very mild congestive heart failure. 2.  Atherosclerosis.   Original Report Authenticated By: Florencia Reasons, M.D.     Scheduled Meds:   . aspirin  324 mg Oral Daily  . atorvastatin  40 mg Oral QHS  . cilostazol  100 mg Oral BID  . cloNIDine  0.1 mg Oral TID  . dextrose  1 ampule Intravenous Once  . fentaNYL  50 mcg Transdermal Q72H  . fesoterodine  4 mg Oral Daily  . furosemide  20 mg Intravenous Q12H  . furosemide  40 mg Intravenous Once  . gabapentin  300 mg Oral TID  . insulin aspart  0-24 Units Subcutaneous Q2H   Followed by  . insulin aspart  0-24 Units Subcutaneous Q4H  . insulin aspart  5 Units Subcutaneous Q supper  . insulin aspart  5-15 Units Subcutaneous TID AC  . insulin glargine  50 Units Subcutaneous QHS  . isosorbide mononitrate  30 mg Oral Daily  . levothyroxine  125 mcg Oral Daily  . loratadine  10 mg Oral Daily  . losartan  25 mg Oral BID  . metaxalone  800 mg Oral TID  . metoprolol tartrate  25 mg Oral BID  . polyethylene glycol  17 g Oral Daily  . senna  2 tablet Oral Daily  . sodium chloride  3 mL Intravenous Q12H  . DISCONTD: insulin aspart  5-15 Units Subcutaneous TID AC   Continuous Infusions:   Principal Problem:  *CHF (congestive heart failure) Active Problems:  DIABETES MELLITUS, TYPE II  HYPERTENSION  SEIZURE DISORDER  URINARY INCONTINENCE  CVA (cerebral vascular accident)  Hypertensive urgency  ARF (acute renal failure)    Time spent: > 35 mins    [facility]  [facility] Pager [phone]. If 8PM-8AM, please contact night-coverage at [email or web address], password [email or web address] 02/10/2012, 9:56 AM  LOS: 1 day

## 2012-02-10 NOTE — Evaluation (Signed)
Physical Therapy Evaluation Patient Details Name: Allison Washington MRN: [id number] DOB: 07/27/1945 Today's Date: 02/10/2012 Time: 4098-1191 PT Time Calculation (min): 29 min  PT Assessment / Plan / Recommendation Clinical Impression  Patient is a 66 yo female resident of [facility], admitted with CHF exacerbation.  Patient with h/o right AKA, and h/o CVA with difficulty with speech.  Per patient and NH report, patient is able to transfer bed to electric w/c "on her own".  Recommend patient return to SNF at discharge.  Patient will benefit from acute PT to maximize independence with transfers prior to return to SNF.    PT Assessment  Patient needs continued PT services    Follow Up Recommendations  Post acute inpatient (Recommend SNF)    Does the patient have the potential to tolerate intense rehabilitation   No, Recommend SNF  Barriers to Discharge        Equipment Recommendations  None recommended by PT    Recommendations for Other Services     Frequency Min 2X/week    Precautions / Restrictions Precautions Precautions: Fall Restrictions Weight Bearing Restrictions: No   Pertinent Vitals/Pain       Mobility  Bed Mobility Bed Mobility: Rolling Right;Right Sidelying to Sit;Sitting - Scoot to Edge of Bed;Sit to Supine Rolling Right: 5: Supervision;With rail Right Sidelying to Sit: 4: Min assist;With rails;HOB elevated Sitting - Scoot to Edge of Bed: 4: Min guard;With rail Sit to Supine: 5: Supervision;With rail;HOB elevated Details for Bed Mobility Assistance: Verbal cues.  Assist to lift trunk off bed. Transfers Transfers: Not assessed (Patient declined transfers today - "tired")           PT Diagnosis: Generalized weakness  PT Problem List: Decreased strength;Decreased activity tolerance;Decreased mobility;Cardiopulmonary status limiting activity PT Treatment Interventions: Functional mobility training;Therapeutic exercise;Balance training;Patient/family  education   PT Goals Acute Rehab PT Goals PT Goal Formulation: With patient Time For Goal Achievement: 02/17/12 Potential to Achieve Goals: Good Pt will go Supine/Side to Sit: with modified independence;with rail PT Goal: Supine/Side to Sit - Progress: Goal set today Pt will go Sit to Supine/Side: with modified independence;with rail PT Goal: Sit to Supine/Side - Progress: Goal set today Pt will Transfer Bed to Chair/Chair to Bed: with supervision PT Transfer Goal: Bed to Chair/Chair to Bed - Progress: Goal set today  Visit Information  Last PT Received On: 02/10/12 Assistance Needed: +1    Subjective Data  Subjective: "By myself" When asked how she gets into her w/c. Patient Stated Goal: None stated   Prior Functioning  Home Living Lives With: Other (Comment) (At [facility]) Available Help at Discharge: Skilled Nursing Facility Home Adaptive Equipment: Wheelchair - powered Prior Function Level of Independence: Needs assistance Needs Assistance: Transfers Transfer Assistance: Requires assist to place w/c near bed.  Independent with transfers bed to electric w/c. Able to Take Stairs?: No Driving: No Vocation: Retired Musician: Expressive difficulties (Difficulty with speech from prior CVA)    Cognition  Overall Cognitive Status: Appears within functional limits for tasks assessed/performed Arousal/Alertness: Awake/alert Orientation Level: Appears intact for tasks assessed Behavior During Session: [facility] for tasks performed    Extremity/Trunk Assessment Right Upper Extremity Assessment RUE ROM/Strength/Tone: WFL for tasks assessed RUE Sensation: WFL - Light Touch Left Upper Extremity Assessment LUE ROM/Strength/Tone: WFL for tasks assessed LUE Sensation: WFL - Light Touch Right Lower Extremity Assessment RLE ROM/Strength/Tone:  (AKA) Left Lower Extremity Assessment LLE ROM/Strength/Tone: WFL for tasks assessed (General weakness - 4/5) LLE  Sensation: History of peripheral  neuropathy   Balance Balance Balance Assessed: Yes Static Sitting Balance Static Sitting - Balance Support: Bilateral upper extremity supported Static Sitting - Level of Assistance: 5: Stand by assistance Static Sitting - Comment/# of Minutes: Patient sat on EOB x 3 minutes.  Patient returned to sidelying, reporting she was "tired".  Was able to maintain static balance with UE support with standby assist.  End of Session PT - End of Session Activity Tolerance: Patient limited by fatigue Patient left: in bed;with call bell/phone within reach;with nursing in room Nurse Communication: Mobility status  GP Functional Assessment Tool Used: Clinical judgement Functional Limitation: Mobility: Walking and moving around Mobility: Walking and Moving Around Current Status (Z6109): At least 20 percent but less than 40 percent impaired, limited or restricted Mobility: Walking and Moving Around Goal Status [phone]): At least 1 percent but less than 20 percent impaired, limited or restricted   Vena Austria 02/10/2012, 3:19 PM Durenda Hurt. Renaldo Fiddler, [facility] Acute Rehab Services Pager [phone]

## 2012-02-10 NOTE — Progress Notes (Signed)
  Echocardiogram 2D Echocardiogram has been performed.  Allison Washington 02/10/2012, 11:43 AM 

## 2012-02-10 NOTE — Progress Notes (Signed)
Utilization Review Completed.  

## 2012-02-10 NOTE — Evaluation (Signed)
Clinical/Bedside Swallow Evaluation Patient Details  Name: Allison Washington MRN: [id number] Date of Birth: Dec 25, 1945  Today's Date: 02/10/2012 Time: 1630-1700 SLP Time Calculation (min): 30 min  Past Medical History:  Past Medical History  Diagnosis Date  . 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 Diabetes mellitus     Prone to hypoglycemia  . Aphagia     Status post numerous  . Dysphagia   . Peripheral vascular disease, unspecified   . Arterial disease   . Depressive disorder   . Hypothyroid   . Seizures   . MRSA infection     History of MRSA in the left leg wound and sacrum in the past-has been seen by vascular surgery and declined any type of intervention including arteriogram  . Diverticulosis     GI bleed in 2007-s/p endoscopy and colonoscopy in 2007 s/p massive GIB consittent with Diveerticulosis  . DVT (deep venous thrombosis)     Not on Coumadin  . Anemia     Chronic  . Non-smoker   . Stroke   . Hypertension    Past Surgical History:  Past Surgical History  Procedure Date  . Above knee leg amputation   . Femoral-popliteal bypass graft    HPI:  66 y/o female admitted from [facility] to ED due to CHF exacerbation. CXR: Lung volumes are normal with no consolidative airspace disease.   PMH significant for CVA x5, dysphagia, asphasia.  SNF contacted by phone and RN reports patient on regular consistency diet and thin liquids.  Patient referred for BSE due to history of dysphagia ,     Assessment / Plan / Recommendation Clinical Impression  No outward s/s of aspiration noted s/p swallow of regular consistency. Slight wet vocal quality s/p swallow of thin by straw but cleared with instructed throat clear/cough.  Recommend to proceed with current diet of mechanical soft and thin liquids with intermittent supervision with meals  due to PMH of dysphagia .  ST to sign off as patient's swallow baseline and education complete.     Aspiration Risk  Mild    Diet Recommendation Dysphagia 3 (Mechanical Soft);Thin liquid   Liquid Administration via: Cup;No straw Medication Administration: Whole meds with liquid Supervision: Intermittent supervision to cue for compensatory strategies;Patient able to self feed Compensations: Slow rate;Small sips/bites;Clear throat intermittently Postural Changes and/or Swallow Maneuvers: Seated upright 90 degrees;Upright 30-60 min after meal    Other  Recommendations Oral Care Recommendations: Oral care BID Other Recommendations: Clarify dietary restrictions   Follow Up Recommendations  Skilled Nursing facility             Swallow Study Prior Functional Status     Resident of SNF on regular consistency diet with thin liquids  General Date of Onset: 02/09/12 HPI: 66 y/o female admitted from [facility] to ED due to CHF exacerbation. CXR: Lung volumes are normal with no consolidative airspace disease.   PMH significant for CVA x5, dysphagia, asphasia.  SNF contacted by phone and RN reports patient on regular consistency diet and thin liquids.  Patient referred for BSE due to history of dysphagia ,   Type of Study: Bedside swallow evaluation Previous Swallow Assessment: MBSx8 with most recent completed 10/13/11 Diet Prior to this Study: Dysphagia 3 (soft);Thin liquids Temperature Spikes Noted: No Respiratory Status: Room air History of Recent Intubation: No Behavior/Cognition:  Alert;Impulsive;Agitated;Uncooperative Oral Cavity - Dentition: Dentures, top (edentulous on bottom) Self-Feeding Abilities: Able to feed self Patient Positioning: Upright in bed Baseline Vocal Quality: Hoarse Volitional Cough: Strong Volitional Swallow: Able to elicit    Oral/Motor/Sensory Function Overall Oral Motor/Sensory Function: Appears within functional limits for tasks assessed   Ice  Chips Ice chips: Not tested   Thin Liquid Thin Liquid: Impaired Presentation: Cup;Straw Pharyngeal  Phase Impairments: Multiple swallows    Nectar Thick Nectar Thick Liquid: Not tested   Honey Thick Honey Thick Liquid: Not tested   Puree Puree: Within functional limits Presentation: Self Fed   Solid   GO Functional Assessment Tool Used: Clinical judgement Functional Limitations: Swallowing Swallow Current Status (U9811): At least 1 percent but less than 20 percent impaired, limited or restricted Swallow Goal Status [phone]): At least 1 percent but less than 20 percent impaired, limited or restricted Swallow Discharge Status [phone]): At least 1 percent but less than 20 percent impaired, limited or restricted  Solid: Within functional limits Presentation: Self [name] MS, CCC-SLP [phone] [facility] 02/10/2012,5:29 PM

## 2012-02-11 LAB — GLUCOSE, CAPILLARY
Glucose-Capillary: 302 mg/dL — ABNORMAL HIGH (ref 70–99)
Glucose-Capillary: 63 mg/dL — ABNORMAL LOW (ref 70–99)

## 2012-02-11 LAB — BASIC METABOLIC PANEL
GFR calc Af Amer: 52 mL/min — ABNORMAL LOW (ref >90)
GFR calc non Af Amer: 45 mL/min — ABNORMAL LOW (ref >90)
Glucose, Bld: 181 mg/dL — ABNORMAL HIGH (ref 70–99)
Potassium: 4.2 mEq/L (ref 3.5–5.1)
Sodium: 141 mEq/L (ref 135–145)

## 2012-02-11 LAB — CBC
Hemoglobin: 11.2 g/dL — ABNORMAL LOW (ref 12.0–15.0)
MCH: 27.1 pg (ref 26.0–34.0)
RBC: 4.14 MIL/uL (ref 3.87–5.11)
WBC: 8.2 10*3/uL (ref 4.0–10.5)

## 2012-02-11 LAB — PRO B NATRIURETIC PEPTIDE: Pro B Natriuretic peptide (BNP): 1632 pg/mL — ABNORMAL HIGH (ref 0–125)

## 2012-02-11 MED ORDER — INSULIN GLARGINE 100 UNIT/ML ~~LOC~~ SOLN
50.0000 [IU] | Freq: Every day | SUBCUTANEOUS | Status: DC
  Administered 2012-02-11: 50 [IU] via SUBCUTANEOUS

## 2012-02-11 MED ORDER — FUROSEMIDE 20 MG PO TABS
20.0000 mg | ORAL_TABLET | Freq: Two times a day (BID) | ORAL | Status: DC
  Administered 2012-02-11: 20 mg via ORAL
  Filled 2012-02-11 (×4): qty 1

## 2012-02-11 MED ORDER — INSULIN GLARGINE 100 UNIT/ML ~~LOC~~ SOLN: 55.0000 [IU] | Freq: Every day | SUBCUTANEOUS | Status: DC

## 2012-02-11 MED ORDER — INSULIN ASPART 100 UNIT/ML ~~LOC~~ SOLN
0.0000 [IU] | Freq: Three times a day (TID) | SUBCUTANEOUS | Status: DC
  Administered 2012-02-12: 3 [IU] via SUBCUTANEOUS

## 2012-02-11 NOTE — Progress Notes (Signed)
Pt had a drop in BP from 209/88 to 122/70 (manual) in an hr pt received all her BP meds earlier, no s/s pt stated was sleepy, MD notified, will continue to monitor

## 2012-02-11 NOTE — Progress Notes (Signed)
Physical Therapy Treatment Patient Details Name: Allison Washington MRN: [id number] DOB: 1946-04-26 Today's Date: 02/11/2012 Time: 5284-1324 PT Time Calculation (min): 12 min  PT Assessment / Plan / Recommendation Comments on Treatment Session  Pt did not want to participate in therapy today and was only agreeable to exercises.  These exercises were very limited to pain.  Plan next session is to perform bed mobility and sit on EOB and perform exercises. Goals updated.    Follow Up Recommendations  Post acute inpatient     Does the patient have the potential to tolerate intense rehabilitation  No, Recommend SNF  Barriers to Discharge  NONE      Equipment Recommendations  None recommended by PT    Recommendations for Other Services Other (comment) (None)  Frequency Min 2X/week   Plan Discharge plan remains appropriate;Frequency remains appropriate    Precautions / Restrictions Precautions Precautions: Fall Restrictions Weight Bearing Restrictions: No   Pertinent Vitals/Pain 5/10 pain in L LE.    Mobility  Bed Mobility Bed Mobility: Not assessed Transfers Transfers: Not assessed Ambulation/Gait Ambulation/Gait Assistance: Not tested (comment) Wheelchair Mobility Wheelchair Mobility: No    Exercises General Exercises - Lower Extremity Ankle Circles/Pumps: AROM;Left;5 reps Quad Sets: AROM;Left (1 rep) Heel Slides: AROM;Left;5 reps Hip ABduction/ADduction: AROM;Left;5 reps Straight Leg Raises: AAROM;Left (2 reps)   PT Diagnosis:    PT Problem List:   PT Treatment Interventions:     PT Goals Acute Rehab PT Goals PT Goal Formulation: With patient Time For Goal Achievement: 02/17/12 Potential to Achieve Goals: Good Pt will Perform Home Exercise Program: with supervision, verbal cues required/provided PT Goal: Perform Home Exercise Program - Progress: Goal set today  Visit Information  Last PT Received On: 02/11/12 Assistance Needed: +1    Subjective Data  Subjective: Not feeling the best. Patient Stated Goal: None stated   Cognition  Overall Cognitive Status: Appears within functional limits for tasks assessed/performed Arousal/Alertness: Awake/alert Orientation Level: Appears intact for tasks assessed Behavior During Session: [facility] for tasks performed Cognition - Other Comments: Pt with focused attention to task and continues to watch tv while therapist attempting to perform exercises.    Balance     End of Session PT - End of Session Activity Tolerance: Patient limited by fatigue;Patient limited by pain Patient left: in bed;with call bell/phone within reach Nurse Communication: Mobility status   GP     Shizue Kaseman 02/11/2012, 2:14 PM  Jake Shark, PT DPT [phone]  Amy DiTommaso, SPT

## 2012-02-11 NOTE — Progress Notes (Signed)
Inpatient Diabetes Program Recommendations  AACE/ADA: New Consensus Statement on Inpatient Glycemic Control (2013)  Target Ranges:  Prepandial:   less than 140 mg/dL      Peak postprandial:   less than 180 mg/dL (1-2 hours)      Critically ill patients:  140 - 180 mg/dL   Diabetes Coordinator paged Dr. Janee Morn to discuss insulin orders.  Patient has not received any Lantus during this admission.  Dr. Janee Morn was not aware.  Novolog orders were confusing so Dr. Janee Morn discontinued and started Novolog MODERATE scale.  Will reassess tomorrow for meal coverage needs.    Inpatient Diabetes Program Recommendations Insulin - Basal: Lantus 50 units (home dose) Correction (SSI): MODERATE correction scale TID Insulin - Meal Coverage: reassess tomorrow for need  Thank you  Piedad Climes [facility] Inpatient Diabetes Coordinator [phone] (8am-5pm)

## 2012-02-11 NOTE — Progress Notes (Signed)
Occupational Therapy Evaluation Patient Details Name: Allison Washington MRN: [id number] DOB: Jun 17, 1945 Today's Date: 02/11/2012 Time: 4098-1191 OT Time Calculation (min): 24 min  OT Assessment / Plan / Recommendation Clinical Impression  66 yo resident of [facility] admitted for CHF exacerbation. Pt appears to be funcitoning at her baseline level. All further OT needs to be addressed at SNF.     OT Assessment  All further OT needs can be met in the next venue of care    Follow Up Recommendations  Skilled nursing facility    Barriers to Discharge      Equipment Recommendations  None recommended by OT    Recommendations for Other Services    Frequency    eval only   Precautions / Restrictions Precautions Precautions: Fall Restrictions Weight Bearing Restrictions: No   Pertinent Vitals/Pain none    ADL  Eating/Feeding: Performed;Modified independent Grooming: Performed;Set up Transfers/Ambulation Related to ADLs: Min guard scoot pivot ADL Comments: Pt states she completes her ADL at facility.    OT Diagnosis: Generalized weakness  OT Problem List: Decreased strength;Decreased range of motion;Obesity OT Treatment Interventions:     OT Goals Acute Rehab OT Goals OT Goal Formulation:  (eval only)  Visit Information  Last OT Received On: 02/11/12 Assistance Needed: +1    Subjective Data      Prior Functioning     Home Living Lives With: Other (Comment) Available Help at Discharge: Skilled Nursing Facility Home Adaptive Equipment: Wheelchair - powered Additional Comments: Pt transfers self at [place] per NH. Prior Function Level of Independence: Needs assistance Needs Assistance: Transfers Transfer Assistance: Requires assist to place w/c near bed.  Independent with transfers bed to electric w/c. Able to Take Stairs?: No Driving: No Vocation: Retired Musician: Expressive difficulties         Vision/Perception     Cognition  Overall Cognitive Status: History of cognitive impairments - at baseline Arousal/Alertness: Awake/alert Orientation Level: Appears intact for tasks assessed Behavior During Session: [facility] for tasks performed Cognition - Other Comments: Decreased frustration level.     Extremity/Trunk Assessment Right Upper Extremity Assessment RUE ROM/Strength/Tone: [facility] for tasks assessed Left Upper Extremity Assessment LUE ROM/Strength/Tone: WFL for tasks assessed Left Lower Extremity Assessment LLE ROM/Strength/Tone: WFL for tasks assessed LLE Sensation: History of peripheral neuropathy     Mobility Bed Mobility Bed Mobility: Supine to Sit;Sitting - Scoot to [place] of Bed Rolling Right: 4: Min assist Right Sidelying to Sit: 4: Min assist Supine to Sit: 4: Min assist Details for Bed Mobility Assistance: requires increased time and use of bed rails     Shoulder Instructions     Exercise   Balance     End of Session OT - End of Session Equipment Utilized During Treatment: Gait belt Activity Tolerance: Patient tolerated treatment well Patient left: in chair;with call bell/phone within reach Nurse Communication: Mobility status  GO     Allison Washington,HILLARYHilary Cloey Washington, OTR/L  [phone] 02/11/2012 02/11/2012, 4:48 PM

## 2012-02-11 NOTE — Progress Notes (Signed)
[facility] PROGRESS NOTE  Sarem Sergio ZOX:[id number] DOB: 01-06-1946 DOA: 02/09/2012 PCP: No primary provider on file.  Assessment/Plan:  #1 acute CHF exacerbation Patient had presented with shortness of breath at rest. Clinical improvement. Patient with good diuresis. I/L. equal -3.5 L. Patient's weight today 79.3 from 79.4 kg from 79.8 kg. Cardiac enzymes are negative x2. 2-D echo with EF 55-60 %. NWMA. Change IV Lasix to oral lasix.. Follow.  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 Continue aspirin.  #3 type 2 diabetes CBGs ranging from 63-302. Continue Lantus. Sliding scale insulin. Continue Neurontin.  #4 hypothyroidism Continue home dose Synthroid  #5 hypertension Patient's systolic blood pressure has improved on her blood pressure medications. Patient currently asymptomatic. Continue Cozaar. Change IV Lasix to oral  Lasix. Continue  Lopressor, Imdur, clonidine.  #6 peripheral vascular disease Continue Pletal  #7 left leg superficial wound Continue current wound care.  #8 status post right BKA  #9 prophylaxis Lovenox for DVT prophylaxis.  Code Status: DNR Family Communication: Updated patient. No family at bedside. Disposition Plan: Back to NH when medically stable   Consultants:  None  Procedures:  CT abdomen and pelvis 02/08/2011  Antibiotics:  None  HPI/Subjective: Patient nodded her head that her breathing has improved significantly.  No complaints. Patient with aphasia.  Objective: Filed Vitals:   02/11/12 0615 02/11/12 0946 02/11/12 0953 02/11/12 1500  BP: 140/80 192/62 192/62 122/62  Pulse: 89  100 95  Temp:    97.9 F (36.6 C)  TempSrc:    Oral  Resp:    16  Height:      Weight:      SpO2:    92%    Intake/Output Summary (Last 24 hours) at 02/11/12 1614 Last data filed at 02/11/12 1400  Gross per 24 hour  Intake    983 ml  Output    950 ml  Net     33 ml   Filed Weights   02/09/12  1901 02/10/12 0425 02/11/12 0517  Weight: 79.8 kg (175 lb 14.8 oz) 79.4 kg (175 lb 0.7 oz) 79.3 kg (174 lb 13.2 oz)    Exam:   General:  NAD. Aphasia  Cardiovascular: RRR. Status post right BKA. Left lower extremity with trace  edema. Superficial wound on the anterior shin.  Respiratory: CTAB  Abdomen: Soft/NT/ND/+BS  Data Reviewed: Basic Metabolic Panel:  Lab 02/11/12 8119 02/10/12 0910 02/09/12 1001  NA 141 147* 141  K 4.2 3.2* 3.8  CL 101 104 104  CO2 28 31 28   GLUCOSE 181* 49* 67*  BUN 15 10 11   CREATININE 1.24* 1.07 1.22*  CALCIUM 8.9 9.3 9.0  MG -- 1.9 --  PHOS -- -- --   Liver Function Tests:  Lab 02/10/12 0910  AST 15  ALT 6  ALKPHOS 93  BILITOT 0.3  PROT 7.4  ALBUMIN 2.9*   No results found for this basename: LIPASE:5,AMYLASE:5 in the last 168 hours No results found for this basename: AMMONIA:5 in the last 168 hours CBC:  Lab 02/11/12 0505 02/10/12 0910 02/09/12 1001  WBC 8.2 7.7 8.5  NEUTROABS -- -- --  HGB 11.2* 11.6* 10.5*  HCT 35.2* 36.8 33.4*  MCV 85.0 85.6 85.4  PLT 291 299 248   Cardiac Enzymes:  Lab 02/09/12 2302 02/09/12 1253  CKTOTAL -- --  CKMB -- --  CKMBINDEX -- --  TROPONINI <0.30 <0.30   BNP (last 3 results)  Basename 02/11/12 0505 02/09/12 2300 02/09/12 1008  PROBNP 1632.0* 1096.0* 836.8*   CBG:  Lab 02/11/12 1100 02/11/12 0602 02/10/12 2234 02/10/12 2051 02/10/12 1701  GLUCAP 302* 168* 248* 233* 227*    Recent Results (from the past 240 hour(s))  URINE CULTURE     Status: Normal   Collection Time   02/09/12  3:37 PM      Component Value Range Status Comment   Specimen Description URINE, CATHETERIZED   Final    Special Requests NONE   Final    Culture  Setup Time 02/09/2012 21:38   Final    Colony Count NO GROWTH   Final    Culture NO GROWTH   Final    Report Status 02/10/2012 FINAL   Final   MRSA PCR SCREENING     Status: Normal   Collection Time   02/10/12  9:38 AM      Component Value Range Status Comment    MRSA by PCR NEGATIVE  NEGATIVE Final      Studies: No results found.  Scheduled Meds:    . aspirin  324 mg Oral Daily  . atorvastatin  40 mg Oral QHS  . cilostazol  100 mg Oral BID  . cloNIDine  0.1 mg Oral TID  . enoxaparin (LOVENOX) injection  30 mg Subcutaneous Q24H  . fentaNYL  50 mcg Transdermal Q72H  . fesoterodine  4 mg Oral Daily  . furosemide  20 mg Intravenous Q12H  . gabapentin  300 mg Oral TID  . insulin aspart  0-24 Units Subcutaneous TID AC & HS  . insulin aspart  5-15 Units Subcutaneous TID AC  . insulin glargine  50 Units Subcutaneous QHS  . isosorbide mononitrate  30 mg Oral Daily  . levothyroxine  125 mcg Oral Daily  . loratadine  10 mg Oral Daily  . losartan  25 mg Oral BID  . metaxalone  800 mg Oral TID  . metoprolol tartrate  25 mg Oral BID  . polyethylene glycol  17 g Oral Daily  . potassium chloride  40 mEq Oral Q4H  . senna  2 tablet Oral Daily  . sodium chloride  3 mL Intravenous Q12H   Continuous Infusions:   Principal Problem:  *CHF (congestive heart failure) Active Problems:  DIABETES MELLITUS, TYPE II  HYPERTENSION  SEIZURE DISORDER  URINARY INCONTINENCE  CVA (cerebral vascular accident)  Hypertensive urgency  ARF (acute renal failure)    Time spent: > 35 mins    [employer] [facility]  [facility] Pager [phone]. If 8PM-8AM, please contact night-coverage at [email or web address], password [facility] 02/11/2012, 4:14 PM  LOS: 2 days

## 2012-02-12 LAB — BASIC METABOLIC PANEL
CO2: 31 mEq/L (ref 19–32)
Calcium: 9 mg/dL (ref 8.4–10.5)
Glucose, Bld: 119 mg/dL — ABNORMAL HIGH (ref 70–99)
Potassium: 3.9 mEq/L (ref 3.5–5.1)
Sodium: 141 mEq/L (ref 135–145)

## 2012-02-12 LAB — GLUCOSE, CAPILLARY
Glucose-Capillary: 133 mg/dL — ABNORMAL HIGH (ref 70–99)
Glucose-Capillary: 212 mg/dL — ABNORMAL HIGH (ref 70–99)

## 2012-02-12 LAB — HEMOGLOBIN A1C
Hgb A1c MFr Bld: 9.4 % — ABNORMAL HIGH
Mean Plasma Glucose: 223 mg/dL — ABNORMAL HIGH

## 2012-02-12 MED ORDER — LORAZEPAM 0.5 MG PO TABS: 0.5000 mg | ORAL_TABLET | Freq: Three times a day (TID) | ORAL | Status: DC | PRN

## 2012-02-12 MED ORDER — OXYCODONE HCL 5 MG PO TABS: 10.0000 mg | ORAL_TABLET | ORAL | Status: DC | PRN

## 2012-02-12 MED ORDER — FUROSEMIDE 20 MG PO TABS: 20.0000 mg | ORAL_TABLET | Freq: Every day | ORAL | Status: DC

## 2012-02-12 MED ORDER — FENTANYL 50 MCG/HR TD PT72: 1.0000 | MEDICATED_PATCH | TRANSDERMAL | Status: DC

## 2012-02-12 NOTE — Progress Notes (Addendum)
Clinical social worker assisted with patient discharge to skilled nursing facility, [facility].  CSW addressed all family questions and concerns. CSW copied chart and added all important documents. CSW also set up patient transportation with [facility]. Clinical Social Worker will sign off for now as social work intervention is no longer needed.   Upton Russey, MSW, LCSWA [phone] 

## 2012-02-12 NOTE — Progress Notes (Addendum)
Late Entry: CSW met with patient at bedside. CSW introduced self explained role and discussed returning to [place]. Patient is agreeable to returning. CSW will contact the facility and will complete all necessary paperwork. CSW will continue to follow and assist with all d/c needs.   Sabino Niemann, MSW, [employer] [phone]

## 2012-02-12 NOTE — Progress Notes (Signed)
Inpatient Diabetes Program Recommendations  AACE/ADA: New Consensus Statement on Inpatient Glycemic Control (2013)  Target Ranges:  Prepandial:   less than 140 mg/dL      Peak postprandial:   less than 180 mg/dL (1-2 hours)      Critically ill patients:  140 - 180 mg/dL   Inpatient Diabetes Program Recommendations Insulin - Basal: Lantus 50 units given last HS Correction (SSI): add HS scale for CBG>200 Insulin - Meal Coverage: reassess with NOON CBG today  Thank you  Allison Washington [facility] Inpatient Diabetes Coordinator [phone] (8am-5pm) After 5pm [phone]

## 2012-02-12 NOTE — Discharge Summary (Signed)
Physician Discharge Summary  Allison Washington WUJ:[id number] DOB: 03-19-1946 DOA: 02/09/2012  PCP: Kimber Relic, MD  Admit date: 02/09/2012 Discharge date: 02/12/2012  Recommendations for Outpatient Follow-up:  1. Patient will need to followup with M.D. at the skilled nursing facility. Patient will need a basic metabolic profile done in 3-4 days to followup on electrolytes and renal function.  Discharge Diagnoses:  Principal Problem:  *CHF (congestive heart failure) Active Problems:  DIABETES MELLITUS, TYPE II  HYPERTENSION  SEIZURE DISORDER  URINARY INCONTINENCE  CVA (cerebral vascular accident)  Hypertensive urgency  Acute renal insufficiency   Discharge Condition: Stable and improved  Diet recommendation: Carb modified  Filed Weights   02/10/12 0425 02/11/12 0517 02/12/12 0300  Weight: 79.4 kg (175 lb 0.7 oz) 79.3 kg (174 lb 13.2 oz) 76.2 kg (167 lb 15.9 oz)    History of present illness:  Allison Washington is a 66 y.o. female  Pt lives at [facility] senior care and brought to ED due to DOE and at rest today. Pt denied any CP, cough, sputum, GI, GU symptoms. No increased swelling in the left leg. No headache or GERD or any other issues. 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 Her baseline creat was 1.22 with A1c 9.8 on 12/11/11. Another creast was 1.56 on 12/07/11. Her vitals at [facility] were 98.5/100/20/183-95. NH recorded SOB with o2 sats in 80 to 90%.    Hospital Course:  #1 acute diastolic CHF exacerbation Patient presented to the ED with shortness of breath and noted to have an elevated BNP. Cardiac enzymes were cycled which were negative x3. 2-D echo was obtained which showed EF of 55-60% with no wall motion abnormalities and grade 1 diastolic dysfunction. Patient was placed on IV Lasix and improved clinically. Patient was subsequently transitioned to oral Lasix which she tolerated. Patient be discharged in stable and improved condition on Lasix 20  mg daily. Patient will followup with M.D. at the skilled nursing facility and will need a basic metabolic profile drawn in the next 3-4 days to followup on her renal function and electrolytes. Patient be discharged in stable and improved condition.  #2 hypertension On admission patient was noted to have severely elevated systolic blood pressures in the 190s to the 200s. Patient however was asymptomatic. Cardiac enzymes which was cycled were negative x3. 2-D echo which was obtained and a normal EF with no wall motion abnormalities. Patient was started back on her home regimen of antihypertensive medications with significant improvement with her blood pressure. By day of discharge patient's blood pressure was 132/71.  #3 acute renal insufficiency/increasing creatinine During the hospitalization patient was noted to have a slight increase in the creatinine which was felt to be secondary to diureses. Patient was changed from IV Lasix to oral Lasix and monitored. Patient be discharged to SNF on Lasix 20 mg daily. Patient will need a repeat basic metabolic profile done in 3-4 days to followup on her renal function. If patient's renal function continues to worsen we'll recommend discontinuing patient's ARB and diuretic. Patient is currently in stable condition.  The rest of patient's chronic medical issues remained stable throughout the hospitalization the patient was discharged in stable and improved condition.  Procedures:  Chest x-ray 02/09/2012  2-D echo 02/10/2012 EF 55-65%  Consultations:  None  Discharge Exam: Filed Vitals:   02/11/12 1856 02/11/12 2200 02/12/12 0300 02/12/12 0600  BP: 192/76 156/68  132/71  Pulse:  88  75  Temp:  98.2  F (36.8 C)  98.3 F (36.8 C)  TempSrc:  Oral  Oral  Resp:  20  20  Height:      Weight:   76.2 kg (167 lb 15.9 oz)   SpO2:  93%  98%    General: NAD Cardiovascular: RRR. No LE edema Respiratory: CTAB  Discharge Instructions      Discharge  Orders    Future Orders Please Complete By Expires   Diet Carb Modified      Increase activity slowly      Discharge instructions      Comments:   Follow up with MD at SNF.       Medication List     As of 02/12/2012 10:19 AM    TAKE these medications         aspirin 81 MG chewable tablet   Chew 324 mg by mouth daily.      atorvastatin 40 MG tablet   Commonly known as: LIPITOR   Take 40 mg by mouth at bedtime.      cilostazol 100 MG tablet   Commonly known as: PLETAL   Take 100 mg by mouth 2 (two) times daily.      cloNIDine 0.1 MG tablet   Commonly known as: CATAPRES   Take 0.1 mg by mouth 3 (three) times daily.      fentaNYL 50 MCG/HR   Commonly known as: DURAGESIC - dosed mcg/hr   Place 1 patch (50 mcg total) onto the skin every 3 (three) days. Last patch was placed on 02-07-12      furosemide 20 MG tablet   Commonly known as: LASIX   Take 1 tablet (20 mg total) by mouth daily.      gabapentin 300 MG capsule   Commonly known as: NEURONTIN   Take 300 mg by mouth 3 (three) times daily.      insulin aspart 100 UNIT/ML injection   Commonly known as: novoLOG   Inject 5 Units into the skin daily with supper. Give if patient eats >50% of meals      insulin aspart 100 UNIT/ML injection   Commonly known as: novoLOG   Inject 5-15 Units into the skin 3 (three) times daily before meals. 5 units before meals and an additional 5 units if cbg is greater than or equal to 150  Give 10 units for cbg 150-300  15 units for cbg >300      insulin glargine 100 UNIT/ML injection   Commonly known as: LANTUS   Inject 50 Units into the skin at bedtime.      isosorbide mononitrate 30 MG 24 hr tablet   Commonly known as: IMDUR   Take 30 mg by mouth daily.      levothyroxine 125 MCG tablet   Commonly known as: SYNTHROID, LEVOTHROID   Take 125 mcg by mouth daily.      loratadine 10 MG tablet   Commonly known as: CLARITIN   Take 10 mg by mouth daily. For allergies       LORazepam 0.5 MG tablet   Commonly known as: ATIVAN   Take 1 tablet (0.5 mg total) by mouth every 8 (eight) hours as needed. For anxiety      losartan 25 MG tablet   Commonly known as: COZAAR   Take 25 mg by mouth 2 (two) times daily.      metaxalone 800 MG tablet   Commonly known as: SKELAXIN   Take 800 mg by mouth 3 (three) times  daily.      metoprolol tartrate 25 MG tablet   Commonly known as: LOPRESSOR   Take 25 mg by mouth 2 (two) times daily.      nitroGLYCERIN 0.4 MG SL tablet   Commonly known as: NITROSTAT   Place 0.4 mg under the tongue every 5 (five) minutes as needed. For chest pain      oxyCODONE 5 MG immediate release tablet   Commonly known as: Oxy IR/ROXICODONE   Take 2 tablets (10 mg total) by mouth every 4 (four) hours as needed. For pain      polyethylene glycol packet   Commonly known as: MIRALAX / GLYCOLAX   Take 17 g by mouth daily. Mix in 8 ounces of water; for constipation      senna 8.6 MG Tabs   Commonly known as: SENOKOT   Take 2 tablets by mouth daily.      tolterodine 4 MG 24 hr capsule   Commonly known as: DETROL LA   Take 4 mg by mouth 2 (two) times daily. Takes at 8 AM and 4 PM      zolpidem 10 MG tablet   Commonly known as: AMBIEN   Take 10 mg by mouth at bedtime as needed. For sleep         Follow-up Information    Please follow up. (f/u with MD at [facility])           The results of significant diagnostics from this hospitalization (including imaging, microbiology, ancillary and laboratory) are listed below for reference.    Significant Diagnostic Studies: Dg Chest 2 View  02/09/2012  *RADIOLOGY REPORT*  Clinical Data: Facial and leg swelling.  CHEST - 2 VIEW  Comparison: Chest x-ray 03/08/2011.  Findings: Lung volumes are normal.  No consolidative airspace disease. There is cephalization of the pulmonary vasculature and slight indistinctness of the interstitial markings suggestive of mild pulmonary edema.  Mild cardiomegaly. The  patient is rotated to the right on today's exam, resulting in distortion of the mediastinal contours and reduced diagnostic sensitivity and specificity for mediastinal pathology.  Atherosclerosis in the thoracic aorta.  IMPRESSION: 1.  Findings, as above, suggestive of very mild congestive heart failure. 2.  Atherosclerosis.   Original Report Authenticated By: Florencia Reasons, M.D.     Microbiology: Recent Results (from the past 240 hour(s))  URINE CULTURE     Status: Normal   Collection Time   02/09/12  3:37 PM      Component Value Range Status Comment   Specimen Description URINE, CATHETERIZED   Final    Special Requests NONE   Final    Culture  Setup Time 02/09/2012 21:38   Final    Colony Count NO GROWTH   Final    Culture NO GROWTH   Final    Report Status 02/10/2012 FINAL   Final   MRSA PCR SCREENING     Status: Normal   Collection Time   02/10/12  9:38 AM      Component Value Range Status Comment   MRSA by PCR NEGATIVE  NEGATIVE Final      Labs: Basic Metabolic Panel:  Lab 02/12/12 0981 02/11/12 0505 02/10/12 0910 02/09/12 1001  NA 141 141 147* 141  K 3.9 4.2 3.2* 3.8  CL 102 101 104 104  CO2 31 28 31 28   GLUCOSE 119* 181* 49* 67*  BUN 21 15 10 11   CREATININE 1.68* 1.24* 1.07 1.22*  CALCIUM 9.0 8.9 9.3 9.0  MG -- --  1.9 --  PHOS -- -- -- --   Liver Function Tests:  Lab 02/10/12 0910  AST 15  ALT 6  ALKPHOS 93  BILITOT 0.3  PROT 7.4  ALBUMIN 2.9*   No results found for this basename: LIPASE:5,AMYLASE:5 in the last 168 hours No results found for this basename: AMMONIA:5 in the last 168 hours CBC:  Lab 02/11/12 0505 02/10/12 0910 02/09/12 1001  WBC 8.2 7.7 8.5  NEUTROABS -- -- --  HGB 11.2* 11.6* 10.5*  HCT 35.2* 36.8 33.4*  MCV 85.0 85.6 85.4  PLT 291 299 248   Cardiac Enzymes:  Lab 02/09/12 2302 02/09/12 1253  CKTOTAL -- --  CKMB -- --  CKMBINDEX -- --  TROPONINI <0.30 <0.30   BNP: BNP (last 3 results)  Basename 02/11/12 0505 02/09/12  2300 02/09/12 1008  PROBNP 1632.0* 1096.0* 836.8*   CBG:  Lab 02/12/12 0754 02/12/12 0646 02/11/12 2113 02/11/12 1719 02/11/12 1659  GLUCAP 133* 160* 265* 101* 63*    Time coordinating discharge: 60 minutes  Signed:  THOMPSON,DANIEL  [facility] 02/12/2012, 10:19 AM

## 2012-02-12 NOTE — Progress Notes (Signed)
Report given to nurse at SNF, patient is ready to be taken to SNF - [facility] and [facility] has been called and is on their way to transport patient. Will continue to monitor until patient is discharge.  Allison Washington to be D/C'd Skilled nursing facility per MD order.  Discussed with the patient and all questions fully answered.   Allison Washington, Allison Washington  Home Medication Instructions ZOX:[id number]   Printed on:02/12/12 1209  Medication Information                    cilostazol (PLETAL) 100 MG tablet Take 100 mg by mouth 2 (two) times daily.             cloNIDine (CATAPRES) 0.1 MG tablet Take 0.1 mg by mouth 3 (three) times daily.             gabapentin (NEURONTIN) 300 MG capsule Take 300 mg by mouth 3 (three) times daily.             levothyroxine (SYNTHROID, LEVOTHROID) 125 MCG tablet Take 125 mcg by mouth daily.             nitroGLYCERIN (NITROSTAT) 0.4 MG SL tablet Place 0.4 mg under the tongue every 5 (five) minutes as needed. For chest pain            insulin aspart (NOVOLOG) 100 UNIT/ML injection Inject 5 Units into the skin daily with supper. Give if patient eats >50% of meals            senna (SENOKOT) 8.6 MG TABS Take 2 tablets by mouth daily.             tolterodine (DETROL LA) 4 MG 24 hr capsule Take 4 mg by mouth 2 (two) times daily. Takes at 8 AM and 4 PM            polyethylene glycol (MIRALAX / GLYCOLAX) packet Take 17 g by mouth daily. Mix in 8 ounces of water; for constipation            zolpidem (AMBIEN) 10 MG tablet Take 10 mg by mouth at bedtime as needed. For sleep            metaxalone (SKELAXIN) 800 MG tablet Take 800 mg by mouth 3 (three) times daily.             loratadine (CLARITIN) 10 MG tablet Take 10 mg by mouth daily. For allergies            losartan (COZAAR) 25 MG tablet Take 25 mg by mouth 2 (two) times daily.           atorvastatin (LIPITOR) 40 MG tablet Take 40 mg by mouth at bedtime.           insulin glargine (LANTUS) 100 UNIT/ML  injection Inject 50 Units into the skin at bedtime.           insulin aspart (NOVOLOG) 100 UNIT/ML injection Inject 5-15 Units into the skin 3 (three) times daily before meals. 5 units before meals and an additional 5 units if cbg is greater than or equal to 150 Give 10 units for cbg 150-300 15 units for cbg >300           aspirin 81 MG chewable tablet Chew 324 mg by mouth daily.           metoprolol tartrate (LOPRESSOR) 25 MG tablet Take 25 mg by mouth 2 (two) times daily.  isosorbide mononitrate (IMDUR) 30 MG 24 hr tablet Take 30 mg by mouth daily.           fentaNYL (DURAGESIC - DOSED MCG/HR) 50 MCG/HR Place 1 patch (50 mcg total) onto the skin every 3 (three) days. Last patch was placed on 02-07-12           furosemide (LASIX) 20 MG tablet Take 1 tablet (20 mg total) by mouth daily.           LORazepam (ATIVAN) 0.5 MG tablet Take 1 tablet (0.5 mg total) by mouth every 8 (eight) hours as needed. For anxiety           oxyCODONE (OXY IR/ROXICODONE) 5 MG immediate release tablet Take 2 tablets (10 mg total) by mouth every 4 (four) hours as needed. For pain             VVS, Skin clean, dry and intact without evidence of skin break down, no evidence of skin tears noted. IV catheter discontinued intact. Site without signs and symptoms of complications. Dressing and pressure applied.  Patient escorted via stretcher, and D/C snf via ambulance/EMT.  Allison Washington 02/12/2012 12:09 PM

## 2012-03-11 ENCOUNTER — Telehealth: Payer: Self-pay | Admitting: Medical Oncology

## 2012-03-11 NOTE — Telephone Encounter (Signed)
WRONG CHART

## 2012-07-24 ENCOUNTER — Other Ambulatory Visit: Payer: Self-pay | Admitting: Geriatric Medicine

## 2012-07-24 MED ORDER — OXYCODONE HCL 5 MG PO TABS: 10.0000 mg | ORAL_TABLET | ORAL | Status: DC | PRN

## 2012-08-05 ENCOUNTER — Non-Acute Institutional Stay (SKILLED_NURSING_FACILITY): Payer: Medicare Other | Admitting: Nurse Practitioner

## 2012-08-05 ENCOUNTER — Encounter: Payer: Self-pay | Admitting: Nurse Practitioner

## 2012-08-05 DIAGNOSIS — I639 Cerebral infarction, unspecified: Secondary | ICD-10-CM

## 2012-08-05 DIAGNOSIS — I10 Essential (primary) hypertension: Secondary | ICD-10-CM

## 2012-08-05 DIAGNOSIS — I6350 Cerebral infarction due to unspecified occlusion or stenosis of unspecified cerebral artery: Secondary | ICD-10-CM

## 2012-08-05 DIAGNOSIS — E119 Type 2 diabetes mellitus without complications: Secondary | ICD-10-CM

## 2012-08-05 NOTE — Assessment & Plan Note (Signed)
Unchanged. Will increase lantus to 30 units BID

## 2012-08-05 NOTE — Progress Notes (Signed)
Patient ID: Allison Washington, female   DOB: [birth year], 67 y.o.   MRN: [id number]  Chief Complaint: medical management of chronic conditions   HPI:  67 year old female with multiple medication conditions who is a long term resident of SNF   244.9-HYPOTHYROIDISM The hypothyroidism is stable.No complications noted from the medication presently being used. is taking synthroid 112 mcg daily. TSH normal on 02/27/12 250.72-DM, COMP CIRCULATION TYPE II UNCONTROLLED The diabetes is unchanged last A1c 9.8 in 04/2012, the blood glucoses have not been satisfactory. no low blood sugars noted. No complications noted from the medication presently being used.Taking lantus 25 units in the am and units 30 nightly and is taking novolog 5 units  for cbgs up to 150, 151-300 10 units, greater than 300 to give 15  units.  272.4-HYPERLIPIDEMIA .No complications noted from the medication presently being used.takes pravastatin 20 mg nightly and fish oil 1 gm PO BID 300.00-ANXIETY The anxiety remains stable.No complications noted from the medication presently being used. ativan 0.5 mg every 8 hours as needed  338.2-PAIN, CHRONIC  well controlled on pain medication- takes duragesic patch every 72 hours; takes neurontin 300 mg tid; takes skelaxin 800 mg three times daily  takes oxycodone 10 mg every 4 hour as needed. 401.1-HYPERTENSION, BENIGN The blood pressure readings since the last visit have been in the target range. No complications noted from the medication presently being used.takes losartan 25mg  BID, clonidine 0.1 mg three times daily takes lopressor 25 mg twice daily takes imdur 30 mg daily  428.0-CONGESTIVE HEART FAILURE The congestive heart failure is stable. The patient's relates no significant weight change and denies worsening dyspnea on exertion, orthopnea, paroxysmal nocturnal dyspnea, pedal edema, palpitations, or chest pain.taking lasix 40mg  daily  443.9-PVD The patient's claudication remains stable.No  complications noted from the medication presently being used. takes asa 81 mg 4 tablets daily, takes pletal 100 mg twice daily 477.0-RHINITIS, ALLERGIC The allergic rhinitis symptoms are stable.No complications noted from the medication presently being used.takes claritin daily  564.00-CONSTIPATION The symptoms are stable.The medication is well tolerated. No other therapies have been tried.takes senna s 2 tabs  daily and miralax daily 780.52-INSOMNIA, UNSPECIFIED  stable is taking ambien 5 mg nightly as needed  788.30-INCONTINENCE, URINARY The urinary incontinence is unchanged.No complications noted from the medication presently being used. takes detrol 4 mg twice daily   Review of Systems: Review of Systems  Constitutional: Negative.   HENT: Negative.   Eyes: Negative.   Respiratory: Negative for cough and shortness of breath.   Cardiovascular: Negative for chest pain, palpitations, orthopnea and leg swelling.  Gastrointestinal: Negative for heartburn (using detrol ), nausea, vomiting, diarrhea and constipation.  Genitourinary: Positive for frequency.  Musculoskeletal: Positive for myalgias (phantom pains ). Negative for falls.  Skin: Negative.   Psychiatric/Behavioral: Negative.      Medications: Patient's Medications  New Prescriptions   No medications on file  Previous Medications   ASPIRIN 81 MG TABLET    Take 81 mg by mouth daily.   ATORVASTATIN (LIPITOR) 40 MG TABLET    Take 40 mg by mouth at bedtime.   CILOSTAZOL (PLETAL) 100 MG TABLET    Take 100 mg by mouth 2 (two) times daily.     CLONIDINE (CATAPRES) 0.1 MG TABLET    Take 0.1 mg by mouth 3 (three) times daily.     FENTANYL (DURAGESIC - DOSED MCG/HR) 50 MCG/HR    Place 1 patch (50 mcg total) onto the skin every 3 (three)  days. Last patch was placed on 02-07-12   FISH OIL-OMEGA-3 FATTY ACIDS 1000 MG CAPSULE    Take 1 g by mouth daily.   GABAPENTIN (NEURONTIN) 300 MG CAPSULE    Take 300 mg by mouth 3 (three) times daily.      INSULIN ASPART (NOVOLOG) 100 UNIT/ML INJECTION    Inject 5 Units into the skin daily with supper. Give if patient eats >50% of meals    INSULIN ASPART (NOVOLOG) 100 UNIT/ML INJECTION    Inject 5-15 Units into the skin 3 (three) times daily before meals. 5 units before meals and an additional 5 units if cbg is greater than or equal to 150 Give 10 units for cbg 150-300 15 units for cbg >300   INSULIN GLARGINE (LANTUS) 100 UNIT/ML INJECTION    Inject 50 Units into the skin at bedtime.   ISOSORBIDE MONONITRATE (IMDUR) 30 MG 24 HR TABLET    Take 30 mg by mouth daily.   LEVOTHYROXINE (SYNTHROID, LEVOTHROID) 125 MCG TABLET    Take 125 mcg by mouth daily.     LORATADINE (CLARITIN) 10 MG TABLET    Take 10 mg by mouth daily. For allergies    LORAZEPAM (ATIVAN) 0.5 MG TABLET    Take 1 tablet (0.5 mg total) by mouth every 8 (eight) hours as needed. For anxiety   LOSARTAN (COZAAR) 25 MG TABLET    Take 25 mg by mouth 2 (two) times daily.   METAXALONE (SKELAXIN) 800 MG TABLET    Take 800 mg by mouth 3 (three) times daily.     METOPROLOL TARTRATE (LOPRESSOR) 25 MG TABLET    Take 25 mg by mouth 2 (two) times daily.   NITROGLYCERIN (NITROSTAT) 0.4 MG SL TABLET    Place 0.4 mg under the tongue every 5 (five) minutes as needed. For chest pain    OXYCODONE (OXY IR/ROXICODONE) 5 MG IMMEDIATE RELEASE TABLET    Take 2 tablets (10 mg total) by mouth every 4 (four) hours as needed. For pain   POLYETHYLENE GLYCOL (MIRALAX / GLYCOLAX) PACKET    Take 17 g by mouth daily. Mix in 8 ounces of water; for constipation    SENNA (SENOKOT) 8.6 MG TABS    Take 2 tablets by mouth daily.     TOLTERODINE (DETROL LA) 4 MG 24 HR CAPSULE    Take 4 mg by mouth 2 (two) times daily. Takes at 8 AM and 4 PM    ZOLPIDEM (AMBIEN) 10 MG TABLET    Take 10 mg by mouth at bedtime as needed. For sleep   Modified Medications   Modified Medication Previous Medication   FUROSEMIDE (LASIX) 20 MG TABLET furosemide (LASIX) 20 MG tablet      Take 40 mg by  mouth daily.    Take 1 tablet (20 mg total) by mouth daily.  Discontinued Medications   No medications on file     Physical Exam: Physical Exam  Constitutional: She is oriented to person, place, and time and well-developed, well-nourished, and in no distress. No distress.  HENT:  Head: Normocephalic and atraumatic.  Eyes: Conjunctivae and EOM are normal. Pupils are equal, round, and reactive to light.  Neck: Normal range of motion. Neck supple.  Cardiovascular: Normal rate, regular rhythm and normal heart sounds.   Pulmonary/Chest: Effort normal and breath sounds normal.  Abdominal: Soft. Bowel sounds are normal.  Musculoskeletal: She exhibits no edema.  Neurological: She is alert and oriented to person, place, and time.  Skin: Skin is  warm and dry. She is not diaphoretic.    Filed Vitals:   08/05/12 1105  BP: 118/77  Pulse: 83  Temp: 97.1 F (36.2 C)  Resp: 20  Height: 5\' 3"  (1.6 m)  Weight: 172 lb 9.6 oz (78.291 kg)      Labs reviewed: Basic Metabolic Panel:  Recent Labs  16/10/96 1001 02/10/12 0910 02/11/12 0505 02/12/12 0823  NA 141 147* 141 141  K 3.8 3.2* 4.2 3.9  CL 104 104 101 102  CO2 28 31 28 31   GLUCOSE 67* 49* 181* 119*  BUN 11 10 15 21   CREATININE 1.22* 1.07 1.24* 1.68*  CALCIUM 9.0 9.3 8.9 9.0  MG  --  1.9  --   --     Liver Function Tests:  Recent Labs  02/10/12 0910  AST 15  ALT 6  ALKPHOS 93  BILITOT 0.3  PROT 7.4  ALBUMIN 2.9*    CBC:  Recent Labs  02/09/12 1001 02/10/12 0910 02/11/12 0505  WBC 8.5 7.7 8.2  HGB 10.5* 11.6* 11.2*  HCT 33.4* 36.8 35.2*  MCV 85.4 85.6 85.0  PLT 248 299 291    06/07/12: hgb 10.4  Cholesterol 208, HDL 28, LDL 133, trig 236     Assessment/Plan HYPERTENSION Patient is stable; continue current regimen. Will monitor and make changes as necessary.  CVA (cerebral vascular accident) Patient is stable; continue current regimen. Will monitor and make changes as necessary.   DIABETES  MELLITUS, TYPE II Unchanged. Will increase lantus to 30 units BID   Labs ordered:  Aic, CMP, CBC

## 2012-08-05 NOTE — Assessment & Plan Note (Signed)
Patient is stable; continue current regimen. Will monitor and make changes as necessary.  

## 2012-08-07 ENCOUNTER — Other Ambulatory Visit: Payer: Self-pay | Admitting: *Deleted

## 2012-08-07 MED ORDER — FENTANYL 50 MCG/HR TD PT72: MEDICATED_PATCH | TRANSDERMAL | Status: DC

## 2012-08-18 ENCOUNTER — Other Ambulatory Visit: Payer: Self-pay | Admitting: *Deleted

## 2012-08-18 MED ORDER — OXYCODONE HCL 5 MG PO TABS: 10.0000 mg | ORAL_TABLET | ORAL | Status: DC | PRN

## 2012-08-20 ENCOUNTER — Other Ambulatory Visit: Payer: Self-pay | Admitting: *Deleted

## 2012-08-20 MED ORDER — OXYCODONE HCL 5 MG PO TABS: 10.0000 mg | ORAL_TABLET | ORAL | Status: DC | PRN

## 2012-09-01 ENCOUNTER — Other Ambulatory Visit: Payer: Self-pay | Admitting: Geriatric Medicine

## 2012-09-01 MED ORDER — ZOLPIDEM TARTRATE 5 MG PO TABS: ORAL_TABLET | ORAL | Status: DC

## 2012-09-04 ENCOUNTER — Encounter: Payer: Self-pay | Admitting: Nurse Practitioner

## 2012-09-04 ENCOUNTER — Non-Acute Institutional Stay (SKILLED_NURSING_FACILITY): Payer: Medicare Other | Admitting: Nurse Practitioner

## 2012-09-04 DIAGNOSIS — R32 Unspecified urinary incontinence: Secondary | ICD-10-CM

## 2012-09-04 DIAGNOSIS — E119 Type 2 diabetes mellitus without complications: Secondary | ICD-10-CM

## 2012-09-04 DIAGNOSIS — I10 Essential (primary) hypertension: Secondary | ICD-10-CM

## 2012-09-04 DIAGNOSIS — I509 Heart failure, unspecified: Secondary | ICD-10-CM

## 2012-09-04 NOTE — Assessment & Plan Note (Signed)
Will get A1C

## 2012-09-04 NOTE — Assessment & Plan Note (Signed)
Patients heart failure  is stable; continue current regimen. Will monitor and make changes as necessary.  

## 2012-09-04 NOTE — Progress Notes (Signed)
Patient ID: Allison Washington, female   DOB: 12-23-1945, 67 y.o.   MRN: [id number]  Chief Complaint: medical management of chronic conditions   HPI:  67 year old female with multiple medication conditions who is a long term resident of SNF. Pt without any complaints today and staff does not report any new concerns.   HYPOTHYROIDISM The hypothyroidism is stable.No complications noted from the medication presently being used. is taking synthroid 125 mcg daily.   DM, COMP CIRCULATION TYPE II UNCONTROLLED The diabetes is unchanged-- the blood glucoses have not been satisfactory. no low blood sugars noted. No complications noted from the medication presently being used.Taking lantus 3- units in the am and units 30 nightly and is taking novolog 5 units for cbgs up to 150, 151-300 10 units, greater than 300 to give 15 units.   HYPERLIPIDEMIA .No complications noted from the medication presently being used.takes pravastatin 20 mg nightly and fish oil 1 gm PO BID   ANXIETY The anxiety remains stable.No complications noted from the medication presently being used. ativan 0.5 mg every 8 hours as needed   PAIN, CHRONIC well controlled on pain medication- takes duragesic patch every 72 hours; takes neurontin 300 mg tid; takes skelaxin 800 mg three times daily takes oxycodone 10 mg every 4 hour as needed.   HYPERTENSION, BENIGN The blood pressure readings since the last visit have been in the target range. No complications noted from the medication presently being used.takes losartan 25mg  BID, clonidine 0.1 mg three times daily takes lopressor 25 mg twice daily takes imdur 30 mg daily   CONGESTIVE HEART FAILURE The congestive heart failure is stable. The patient's relates no significant weight change and denies worsening dyspnea on exertion, orthopnea, paroxysmal nocturnal dyspnea, pedal edema, palpitations, or chest pain.taking lasix 40mg  daily   PVD The patient's claudication remains stable.No  complications noted from the medication presently being used. takes asa 81 mg 4 tablets daily, takes pletal 100 mg twice daily   RHINITIS, ALLERGIC The allergic rhinitis symptoms are stable.No complications noted from the medication presently being used.takes claritin daily   CONSTIPATION The symptoms are stable.The medication is well tolerated. No other therapies have been tried.takes senna s 2 tabs daily and miralax daily   INSOMNIA, UNSPECIFIED stable is taking ambien 5 mg nightly as needed   INCONTINENCE, URINARY The urinary incontinence is unchanged.No complications noted from the medication presently being used. takes detrol 4 mg twice daily      Review of Systems:  Review of Systems  Constitutional: Negative for fever, chills and malaise/fatigue.  HENT: Negative for sore throat.   Respiratory: Negative for cough and shortness of breath.   Cardiovascular: Negative for chest pain and palpitations.  Gastrointestinal: Negative for abdominal pain, diarrhea and constipation.  Genitourinary: Positive for frequency (therefore she request to cont detrol). Negative for dysuria and urgency.  Musculoskeletal: Positive for myalgias (phantom pains).  Skin: Negative for itching and rash.  Neurological: Negative for dizziness, weakness and headaches.  Psychiatric/Behavioral: Negative for depression. The patient has insomnia (uses [place]). The patient is not nervous/anxious.     Medications: Patient's Medications  New Prescriptions   No medications on file  Previous Medications   ASPIRIN 81 MG TABLET    Take 81 mg by mouth daily.   ATORVASTATIN (LIPITOR) 40 MG TABLET    Take 40 mg by mouth at bedtime.   CILOSTAZOL (PLETAL) 100 MG TABLET    Take 100 mg by mouth 2 (two) times daily.  CLONIDINE (CATAPRES) 0.1 MG TABLET    Take 0.1 mg by mouth 3 (three) times daily.     FENTANYL (DURAGESIC - DOSED MCG/HR) 50 MCG/HR    Apply one patch topically every 72 hours *Rotate Sites* Nurses must check  placement every shift and notify (DON) if missing   FISH OIL-OMEGA-3 FATTY ACIDS 1000 MG CAPSULE    Take 1 g by mouth daily.   FUROSEMIDE (LASIX) 20 MG TABLET    Take 40 mg by mouth daily.   GABAPENTIN (NEURONTIN) 300 MG CAPSULE    Take 300 mg by mouth 3 (three) times daily.     INSULIN ASPART (NOVOLOG) 100 UNIT/ML INJECTION    Inject 5 Units into the skin daily with supper. Give if patient eats >50% of meals    INSULIN ASPART (NOVOLOG) 100 UNIT/ML INJECTION    Inject 5-15 Units into the skin 3 (three) times daily before meals. 5 units before meals and an additional 5 units if cbg is greater than or equal to 150 Give 10 units for cbg 150-300 15 units for cbg >300   INSULIN GLARGINE (LANTUS) 100 UNIT/ML INJECTION    Inject 30 Units into the skin 2 (two) times daily.    ISOSORBIDE MONONITRATE (IMDUR) 30 MG 24 HR TABLET    Take 30 mg by mouth daily.   LEVOTHYROXINE (SYNTHROID, LEVOTHROID) 125 MCG TABLET    Take 125 mcg by mouth daily.     LORATADINE (CLARITIN) 10 MG TABLET    Take 10 mg by mouth daily. For allergies    LORAZEPAM (ATIVAN) 0.5 MG TABLET    Take 1 tablet (0.5 mg total) by mouth every 8 (eight) hours as needed. For anxiety   LOSARTAN (COZAAR) 25 MG TABLET    Take 25 mg by mouth 2 (two) times daily.   METAXALONE (SKELAXIN) 800 MG TABLET    Take 800 mg by mouth 3 (three) times daily.     METOPROLOL TARTRATE (LOPRESSOR) 25 MG TABLET    Take 25 mg by mouth 2 (two) times daily.   NITROGLYCERIN (NITROSTAT) 0.4 MG SL TABLET    Place 0.4 mg under the tongue every 5 (five) minutes as needed. For chest pain    OXYCODONE (OXY IR/ROXICODONE) 5 MG IMMEDIATE RELEASE TABLET    Take 2 tablets (10 mg total) by mouth every 4 (four) hours as needed. For pain   POLYETHYLENE GLYCOL (MIRALAX / GLYCOLAX) PACKET    Take 17 g by mouth daily. Mix in 8 ounces of water; for constipation    SENNA (SENOKOT) 8.6 MG TABS    Take 2 tablets by mouth daily.     TOLTERODINE (DETROL LA) 4 MG 24 HR CAPSULE    Take 4 mg by  mouth 2 (two) times daily. Takes at 8 AM and 4 PM    ZOLPIDEM (AMBIEN) 5 MG TABLET    Take one tablet by mouth at bedtime as needed for sleep.  Modified Medications   No medications on file  Discontinued Medications   No medications on file     Physical Exam: Physical Exam  Nursing note and vitals reviewed. Constitutional: She is oriented to person, place, and time. She appears well-developed and well-nourished. No distress.  HENT:  Head: Normocephalic and atraumatic.  Eyes: EOM are normal. Pupils are equal, round, and reactive to light.  Neck: Normal range of motion. Neck supple. No thyromegaly present.  Cardiovascular: Normal rate, regular rhythm and normal heart sounds.   Pulmonary/Chest: Effort normal and breath  sounds normal.  Abdominal: Soft. Bowel sounds are normal.  Musculoskeletal: Normal range of motion. Edema: trace   Neurological: She is alert and oriented to person, place, and time.  Skin: Skin is warm and dry. She is not diaphoretic.  Psychiatric: She has a normal mood and affect.     Filed Vitals:   09/04/12 1533  BP: 152/79  Pulse: 70  Temp: 98.7 F (37.1 C)  Resp: 20  Weight: 176 lb (79.833 kg)  SpO2: 98%      Labs reviewed: Basic Metabolic Panel:  Recent Labs  16/10/96 1001 02/10/12 0910 02/11/12 0505 02/12/12 0823  NA 141 147* 141 141  K 3.8 3.2* 4.2 3.9  CL 104 104 101 102  CO2 28 31 28 31   GLUCOSE 67* 49* 181* 119*  BUN 11 10 15 21   CREATININE 1.22* 1.07 1.24* 1.68*  CALCIUM 9.0 9.3 8.9 9.0  MG  --  1.9  --   --     Liver Function Tests:  Recent Labs  02/10/12 0910  AST 15  ALT 6  ALKPHOS 93  BILITOT 0.3  PROT 7.4  ALBUMIN 2.9*    CBC:  Recent Labs  02/09/12 1001 02/10/12 0910 02/11/12 0505  WBC 8.5 7.7 8.2  HGB 10.5* 11.6* 11.2*  HCT 33.4* 36.8 35.2*  MCV 85.4 85.6 85.0  PLT 248 299 291     Assessment/Plan DIABETES MELLITUS, TYPE II Will get A1C  CHF (congestive heart failure) Patients heart failure is  stable; continue current regimen. Will monitor and make changes as necessary.    URINARY INCONTINENCE Patient is stable; continue current regimen. Will monitor and make changes as necessary.   HYPERTENSION Patient is stable; continue current regimen. Will monitor and make changes as necessary.    Labs/tests ordered Aic,cbc, cmp

## 2012-09-04 NOTE — Assessment & Plan Note (Signed)
Patient is stable; continue current regimen. Will monitor and make changes as necessary.  

## 2012-09-05 ENCOUNTER — Other Ambulatory Visit: Payer: Self-pay | Admitting: *Deleted

## 2012-09-05 MED ORDER — LORAZEPAM 0.5 MG PO TABS: ORAL_TABLET | ORAL | Status: DC

## 2012-09-09 ENCOUNTER — Other Ambulatory Visit: Payer: Self-pay | Admitting: Geriatric Medicine

## 2012-09-09 MED ORDER — LORAZEPAM 0.5 MG PO TABS: 0.5000 mg | ORAL_TABLET | Freq: Three times a day (TID) | ORAL | Status: DC

## 2012-09-15 ENCOUNTER — Other Ambulatory Visit: Payer: Self-pay | Admitting: *Deleted

## 2012-09-15 MED ORDER — FENTANYL 50 MCG/HR TD PT72: MEDICATED_PATCH | TRANSDERMAL | Status: DC

## 2012-10-06 ENCOUNTER — Non-Acute Institutional Stay (SKILLED_NURSING_FACILITY): Payer: Medicare Other | Admitting: Nurse Practitioner

## 2012-10-06 ENCOUNTER — Encounter: Payer: Self-pay | Admitting: Nurse Practitioner

## 2012-10-06 DIAGNOSIS — I639 Cerebral infarction, unspecified: Secondary | ICD-10-CM

## 2012-10-06 DIAGNOSIS — E119 Type 2 diabetes mellitus without complications: Secondary | ICD-10-CM

## 2012-10-06 DIAGNOSIS — I10 Essential (primary) hypertension: Secondary | ICD-10-CM

## 2012-10-06 DIAGNOSIS — I6350 Cerebral infarction due to unspecified occlusion or stenosis of unspecified cerebral artery: Secondary | ICD-10-CM

## 2012-10-06 DIAGNOSIS — E785 Hyperlipidemia, unspecified: Secondary | ICD-10-CM

## 2012-10-06 NOTE — Progress Notes (Signed)
Patient ID: Allison Washington, female   DOB: 18-Sep-1945, 67 y.o.   MRN: [id number]  Nursing Home Location:  [facility]   Place of Service: SNF (31)   Chief Complaint: MEDICAL management of chronic conditions   HPI:  67 year old female with multiple medication conditions who is a long term resident of [facility] [place]. Pt without any complaints today and staff does not report any new concerns.  HYPOTHYROIDISM The hypothyroidism is stable.No complications noted from the medication presently being used. is taking synthroid 125 mcg daily.  DM, COMP CIRCULATION TYPE II UNCONTROLLED The diabetes is unchanged-- the blood glucoses have not been satisfactory. Pt noncompliant with diet.  no low blood sugars noted. No complications noted from the medication presently being used. HYPERLIPIDEMIA .No complications noted from the medication presently being used.takes pravastatin 20 mg nightly and fish oil 1 gm PO BID - cholesterol remains elelvated- pt noncompliant with diet  ANXIETY The anxiety remains stable.No complications noted from the medication presently being used. ativan 0.5 mg every 8 hours as needed  PAIN, CHRONIC well controlled on pain medication- takes duragesic patch every 72 hours; takes neurontin 300 mg tid; takes skelaxin 800 mg three times daily takes oxycodone 10 mg every 4 hour as needed.  HYPERTENSION, BENIGN The blood pressure readings since the last visit have been in the target range. No complications noted from the medication presently being used.takes losartan 25mg  BID, clonidine 0.1 mg three times daily takes lopressor 25 mg twice daily takes imdur 30 mg daily  CONGESTIVE HEART FAILURE The congestive heart failure is stable. The patient's relates no significant weight change and denies worsening dyspnea on exertion, orthopnea, paroxysmal nocturnal dyspnea, pedal edema, palpitations, or chest pain.taking lasix 40mg  daily  PVD The patient's claudication remains stable.No  complications noted from the medication presently being used. takes asa 81 mg 4 tablets daily, takes pletal 100 mg twice daily  RHINITIS, ALLERGIC The allergic rhinitis symptoms are stable.No complications noted from the medication presently being used.takes claritin daily  CONSTIPATION The symptoms are stable.The medication is well tolerated. No other therapies have been tried.takes senna s 2 tabs daily and miralax daily  INSOMNIA, UNSPECIFIED stable is taking ambien 5 mg nightly as needed  INCONTINENCE, URINARY The urinary incontinence is unchanged.No complications noted from the medication presently being used. takes detrol 4 mg twice daily    Review of Systems:  Review of Systems  Constitutional: Negative for fever, chills and malaise/fatigue.  HENT: Negative.   Cardiovascular: Negative for chest pain and leg swelling.  Gastrointestinal: Negative for abdominal pain, diarrhea and constipation.  Genitourinary: Positive for frequency. Negative for dysuria.  Musculoskeletal: Positive for myalgias.  Skin: Negative.   Neurological: Negative for weakness.  Psychiatric/Behavioral: Negative for depression. The patient is not nervous/anxious and does not have insomnia.      Medications: Patient's Medications  New Prescriptions   No medications on file  Previous Medications   ASPIRIN 81 MG TABLET    Take 81 mg by mouth daily.   ATORVASTATIN (LIPITOR) 40 MG TABLET    Take 40 mg by mouth at bedtime.   CILOSTAZOL (PLETAL) 100 MG TABLET    Take 100 mg by mouth 2 (two) times daily.     CLONIDINE (CATAPRES) 0.1 MG TABLET    Take 0.1 mg by mouth 3 (three) times daily.     FISH OIL-OMEGA-3 FATTY ACIDS 1000 MG CAPSULE    Take 1 g by mouth daily.   FUROSEMIDE (LASIX) 20 MG  TABLET    Take 40 mg by mouth daily.   GABAPENTIN (NEURONTIN) 300 MG CAPSULE    Take 300 mg by mouth 3 (three) times daily.     INSULIN ASPART (NOVOLOG) 100 UNIT/ML INJECTION    Inject 5 Units into the skin daily with supper. Give  if patient eats >50% of meals    INSULIN ASPART (NOVOLOG) 100 UNIT/ML INJECTION    Inject 5-15 Units into the skin 3 (three) times daily before meals. 5 units before meals and an additional 5 units if cbg is greater than or equal to 150 Give 10 units for cbg 150-300 15 units for cbg >300   INSULIN GLARGINE (LANTUS) 100 UNIT/ML INJECTION    Inject 30 Units into the skin 2 (two) times daily.    ISOSORBIDE MONONITRATE (IMDUR) 30 MG 24 HR TABLET    Take 30 mg by mouth daily.   LEVOTHYROXINE (SYNTHROID, LEVOTHROID) 125 MCG TABLET    Take 125 mcg by mouth daily.     LORATADINE (CLARITIN) 10 MG TABLET    Take 10 mg by mouth daily. For allergies    LORAZEPAM (ATIVAN) 0.5 MG TABLET    Take 1 tablet every 8 hours as needed   LORAZEPAM (ATIVAN) 0.5 MG TABLET    Take 1 tablet (0.5 mg total) by mouth every 8 (eight) hours.   LOSARTAN (COZAAR) 25 MG TABLET    Take 25 mg by mouth 2 (two) times daily.   METAXALONE (SKELAXIN) 800 MG TABLET    Take 800 mg by mouth 3 (three) times daily.     METOPROLOL TARTRATE (LOPRESSOR) 25 MG TABLET    Take 25 mg by mouth 2 (two) times daily.   NITROGLYCERIN (NITROSTAT) 0.4 MG SL TABLET    Place 0.4 mg under the tongue every 5 (five) minutes as needed. For chest pain    OXYCODONE (OXY IR/ROXICODONE) 5 MG IMMEDIATE RELEASE TABLET    Take 2 tablets (10 mg total) by mouth every 4 (four) hours as needed. For pain   POLYETHYLENE GLYCOL (MIRALAX / GLYCOLAX) PACKET    Take 17 g by mouth daily. Mix in 8 ounces of water; for constipation    SENNA (SENOKOT) 8.6 MG TABS    Take 2 tablets by mouth daily.     TOLTERODINE (DETROL LA) 4 MG 24 HR CAPSULE    Take 4 mg by mouth 2 (two) times daily. Takes at 8 AM and 4 PM    ZOLPIDEM (AMBIEN) 5 MG TABLET    Take one tablet by mouth at bedtime as needed for sleep.  Modified Medications   Modified Medication Previous Medication   FENTANYL (DURAGESIC - DOSED MCG/HR) 50 MCG/HR fentaNYL (DURAGESIC - DOSED MCG/HR) 50 MCG/HR      Apply one patch  topically every 72 hours *Rotate Sites* Nurses must check placement every shift and notify (DON) if missing    Apply one patch topically every 72 hours *Rotate Sites* Nurses must check placement every shift and notify (DON) if missing  Discontinued Medications   No medications on file     Physical Exam:  Filed Vitals:   10/06/12 1738  BP: 124/72  Pulse: 68  Temp: 97 F (36.1 C)  Resp: 20    Physical Exam  Constitutional: She is well-developed, well-nourished, and in no distress. No distress.  HENT:  Mouth/Throat: Oropharynx is clear and moist. No oropharyngeal exudate.  Eyes: Conjunctivae and EOM are normal. Pupils are equal, round, and reactive to light.  Cardiovascular: Normal  rate, regular rhythm and normal heart sounds.   Pulmonary/Chest: Effort normal and breath sounds normal.  Abdominal: Soft. Bowel sounds are normal. She exhibits no distension. There is no tenderness.  Musculoskeletal: She exhibits edema (trace).  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  Skin: Skin is warm and dry. She is not diaphoretic.     Labs reviewed: CBC NO Diff (Complete Blood Count)       Result: 09/09/2012 9:32 AM    ( Status: F )       C     WBC  7.2        4.0-10.5  K/uL  SLN       RBC  3.48     L  3.87-5.11  MIL/uL  SLN       Hemoglobin  9.7     L  12.0-15.0  g/dL  SLN       Hematocrit  29.2     L  36.0-46.0  %  SLN       MCV  83.9        78.0-100.0  fL  SLN       MCH  27.9        26.0-34.0  pg  SLN       MCHC  33.2        30.0-36.0  g/dL  SLN       RDW  16.1        11.5-15.5  %  SLN       Platelet Count  269        150-400  K/uL  SLN      Basic Metabolic Panel       Result: 09/09/2012 11:57 AM    ( Status: F )            Sodium  135        135-145  mEq/L  SLN       Potassium  4.2        3.5-5.3  mEq/L  SLN       Chloride  102        96-112  mEq/L  SLN       CO2  25        19-32  mEq/L  SLN       Glucose  280     H   70-99  mg/dL  SLN       BUN  28     H  6-23  mg/dL  SLN       Creatinine  1.55     H  0.50-1.10  mg/dL  SLN       Calcium  8.3     L  8.4-10.5  mg/dL  SLN      Lipid Profile       Result: 09/09/2012 11:57 AM    ( Status: F )            Cholesterol  237     H  0-200  mg/dL  SLN  C     Triglyceride  529     H  <150  mg/dL  SLN       HDL Cholesterol  29     L  >39  mg/dL  SLN       Total Chol/HDL Ratio  8.2         Ratio  SLN  VLDL Cholesterol (Calc)  NOT CALC        0-40  mg/dL  SLN  C     LDL Cholesterol (Calc)         0-99  mg/dL  SLN  C    Liver Profile       Result: 09/09/2012 11:57 AM    ( Status: F )            Bilirubin, Total  0.2     L  0.3-1.2  mg/dL  SLN       Bilirubin, Direct  0.1        0.0-0.3  mg/dL  SLN       Indirect Bilirubin  0.1        0.0-0.9  mg/dL  SLN       Alkaline Phosphatase  90        39-117  U/L  SLN       AST/SGOT  13        0-37  U/L  SLN       ALT/SGPT  <8        0-35  U/L  SLN       Total Protein  6.1        6.0-8.3  g/dL  SLN       Albumin  3.1     L  3.5-5.2  g/dL  SLN      Hemoglobin Z6X       Result: 09/09/2012 2:16 PM    ( Status: F )            Hemoglobin A1C  9.6     H  <5.7  %  SLN  C     Estimated Average Glucose  229     H        Assessment/Plan Other and unspecified hyperlipidemia Triglycerides remain high- dietary noncompliance- ongoing education- will add gemfibrozil 600 mg BID at this time.   CVA (cerebral vascular accident) Stable   DIABETES MELLITUS, TYPE II A1c remains elevated- 9.8 on last check- will increase lantus to 30 units BID  HYPERTENSION Patient is stable; continue current regimen. Will monitor and make changes as necessary.

## 2012-10-13 ENCOUNTER — Other Ambulatory Visit: Payer: Self-pay | Admitting: Geriatric Medicine

## 2012-10-13 MED ORDER — FENTANYL 50 MCG/HR TD PT72: MEDICATED_PATCH | TRANSDERMAL | Status: DC

## 2012-10-20 DIAGNOSIS — E785 Hyperlipidemia, unspecified: Secondary | ICD-10-CM | POA: Insufficient documentation

## 2012-10-20 NOTE — Assessment & Plan Note (Signed)
Stable

## 2012-10-20 NOTE — Assessment & Plan Note (Signed)
A1c remains elevated- 9.8 on last check- will increase lantus to 30 units BID

## 2012-10-20 NOTE — Assessment & Plan Note (Signed)
Triglycerides remain high- dietary noncompliance- ongoing education- will add gemfibrozil 600 mg BID at this time.

## 2012-10-20 NOTE — Assessment & Plan Note (Signed)
Patient is stable; continue current regimen. Will monitor and make changes as necessary.  

## 2012-10-28 ENCOUNTER — Other Ambulatory Visit: Payer: Self-pay | Admitting: *Deleted

## 2012-10-28 MED ORDER — OXYCODONE HCL 5 MG PO TABS: ORAL_TABLET | ORAL | Status: DC

## 2012-11-06 ENCOUNTER — Non-Acute Institutional Stay (SKILLED_NURSING_FACILITY): Payer: Medicare Other | Admitting: Nurse Practitioner

## 2012-11-06 ENCOUNTER — Encounter: Payer: Self-pay | Admitting: Nurse Practitioner

## 2012-11-06 DIAGNOSIS — E785 Hyperlipidemia, unspecified: Secondary | ICD-10-CM

## 2012-11-06 DIAGNOSIS — I10 Essential (primary) hypertension: Secondary | ICD-10-CM

## 2012-11-06 DIAGNOSIS — R32 Unspecified urinary incontinence: Secondary | ICD-10-CM

## 2012-11-06 DIAGNOSIS — E119 Type 2 diabetes mellitus without complications: Secondary | ICD-10-CM

## 2012-11-06 NOTE — Progress Notes (Signed)
Patient ID: Allison Washington, female   DOB: 03/26/1946, 67 y.o.   MRN: [id number]  Nursing Home Location:  [facility]   Place of Service: SNF (31)   Chief Complaint: medical management of chronic conditions  HPI:  67 year old female with multiple medication conditions who is a long term resident of [place]. Pt without any complaints today and staff does not report any new concerns.  HYPOTHYROIDISM The hypothyroidism is stable.No complications noted from the medication presently being used. is taking synthroid 125 mcg daily.  DM, COMP CIRCULATION TYPE II UNCONTROLLED The diabetes is unchanged-- the blood glucoses have not been satisfactory. Pt noncompliant with diet. no low blood sugars noted. No complications noted from the medication presently being used.  HYPERLIPIDEMIA .No complications noted from the medication presently being used.takes pravastatin 20 mg nightly and fish oil 1 gm PO BID - cholesterol remains elelvated- pt noncompliant with diet  ANXIETY The anxiety remains stable.No complications noted from the medication presently being used. ativan 0.5 mg every 8 hours as needed  PAIN, CHRONIC well controlled on pain medication- takes duragesic patch every 72 hours; takes neurontin 300 mg tid; takes skelaxin 800 mg three times daily takes oxycodone 10 mg every 4 hour as needed.  HYPERTENSION, BENIGN The blood pressure readings since the last visit have been in the target range. No complications noted from the medication presently being used.takes losartan 25mg  BID, clonidine 0.1 mg three times daily takes lopressor 25 mg twice daily takes imdur 30 mg daily  CONGESTIVE HEART FAILURE The congestive heart failure is stable. The patient's relates no significant weight change and denies worsening dyspnea on exertion, orthopnea, paroxysmal nocturnal dyspnea, pedal edema, palpitations, or chest pain.taking lasix 40mg  daily  PVD The patient's claudication remains stable.No  complications noted from the medication presently being used. takes asa 81 mg 4 tablets daily, takes pletal 100 mg twice daily  RHINITIS, ALLERGIC The allergic rhinitis symptoms are stable.No complications noted from the medication presently being used.takes claritin daily  CONSTIPATION The symptoms are stable.The medication is well tolerated. No other therapies have been tried.takes senna s 2 tabs daily and miralax daily  INSOMNIA, UNSPECIFIED stable is taking ambien 5 mg nightly as needed  INCONTINENCE, URINARY The urinary incontinence is unchanged.No complications noted from the medication presently being used. takes detrol 4 mg twice daily   Review of Systems:  Review of Systems  Constitutional: Negative for fever, chills, weight loss and malaise/fatigue.  Respiratory: Negative for cough and shortness of breath.   Cardiovascular: Negative for chest pain, palpitations and leg swelling.  Gastrointestinal: Negative for abdominal pain, diarrhea and constipation.  Genitourinary: Positive for frequency. Negative for dysuria and urgency.  Skin: Negative.   Neurological: Negative for weakness.  Psychiatric/Behavioral: Negative for depression. The patient has insomnia. The patient is not nervous/anxious.      Medications: Patient's Medications  New Prescriptions   No medications on file  Previous Medications   ASPIRIN 81 MG TABLET    Take 81 mg by mouth daily.   ATORVASTATIN (LIPITOR) 40 MG TABLET    Take 40 mg by mouth at bedtime.   CILOSTAZOL (PLETAL) 100 MG TABLET    Take 100 mg by mouth 2 (two) times daily.     CLONIDINE (CATAPRES) 0.1 MG TABLET    Take 0.1 mg by mouth 3 (three) times daily.     FENTANYL (DURAGESIC - DOSED MCG/HR) 50 MCG/HR    Apply one patch topically every 72 hours *Rotate Sites*  Nurses must check placement every shift and notify (DON) if missing   FISH OIL-OMEGA-3 FATTY ACIDS 1000 MG CAPSULE    Take 1 g by mouth daily.   FUROSEMIDE (LASIX) 20 MG TABLET    Take 40 mg  by mouth daily.   GABAPENTIN (NEURONTIN) 300 MG CAPSULE    Take 300 mg by mouth 3 (three) times daily.     INSULIN ASPART (NOVOLOG) 100 UNIT/ML INJECTION    Inject 5 Units into the skin daily with supper. Give if patient eats >50% of meals    INSULIN ASPART (NOVOLOG) 100 UNIT/ML INJECTION    Inject 5-15 Units into the skin 3 (three) times daily before meals. 5 units before meals and an additional 5 units if cbg is greater than or equal to 150 Give 10 units for cbg 150-300 15 units for cbg >300   INSULIN GLARGINE (LANTUS) 100 UNIT/ML INJECTION    Inject 32 Units into the skin 2 (two) times daily.    ISOSORBIDE MONONITRATE (IMDUR) 30 MG 24 HR TABLET    Take 30 mg by mouth daily.   LEVOTHYROXINE (SYNTHROID, LEVOTHROID) 125 MCG TABLET    Take 125 mcg by mouth daily.     LORATADINE (CLARITIN) 10 MG TABLET    Take 10 mg by mouth daily. For allergies    LORAZEPAM (ATIVAN) 0.5 MG TABLET    Take 1 tablet every 8 hours as needed   LORAZEPAM (ATIVAN) 0.5 MG TABLET    Take 1 tablet (0.5 mg total) by mouth every 8 (eight) hours.   LOSARTAN (COZAAR) 25 MG TABLET    Take 25 mg by mouth 2 (two) times daily.   METAXALONE (SKELAXIN) 800 MG TABLET    Take 800 mg by mouth 3 (three) times daily.     METOPROLOL TARTRATE (LOPRESSOR) 25 MG TABLET    Take 25 mg by mouth 2 (two) times daily.   NITROGLYCERIN (NITROSTAT) 0.4 MG SL TABLET    Place 0.4 mg under the tongue every 5 (five) minutes as needed. For chest pain    OXYCODONE (OXY IR/ROXICODONE) 5 MG IMMEDIATE RELEASE TABLET    Take 2 every 4 hours as neede   POLYETHYLENE GLYCOL (MIRALAX / GLYCOLAX) PACKET    Take 17 g by mouth daily. Mix in 8 ounces of water; for constipation    SENNA (SENOKOT) 8.6 MG TABS    Take 2 tablets by mouth daily.     TOLTERODINE (DETROL LA) 4 MG 24 HR CAPSULE    Take 4 mg by mouth 2 (two) times daily. Takes at 8 AM and 4 PM    ZOLPIDEM (AMBIEN) 5 MG TABLET    Take one tablet by mouth at bedtime as needed for sleep.  Modified Medications    No medications on file  Discontinued Medications   No medications on file     Physical Exam:  Filed Vitals:   11/06/12 1754  BP: 132/80  Pulse: 62  Temp: 98 F (36.7 C)  Resp: 18    Physical Exam  Constitutional: She is well-developed, well-nourished, and in no distress. No distress.  HENT:  Head: Normocephalic and atraumatic.  Mouth/Throat: Oropharynx is clear and moist. No oropharyngeal exudate.  Eyes: Conjunctivae and EOM are normal. Pupils are equal, round, and reactive to light.  Neck: Normal range of motion. Neck supple.  Cardiovascular: Normal rate and regular rhythm.   Pulmonary/Chest: Effort normal and breath sounds normal. No respiratory distress.  Abdominal: Soft. Bowel sounds are normal.  Musculoskeletal:  She exhibits no edema and no tenderness.  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  Skin: Skin is warm and dry. She is not diaphoretic.     Labs reviewed: CBC NO Diff (Complete Blood Count)  Result: 09/09/2012 9:32 AM ( Status: F ) C  WBC 7.2 4.0-10.5 K/uL SLN  RBC 3.48 L 3.87-5.11 MIL/uL SLN  Hemoglobin 9.7 L 12.0-15.0 g/dL SLN  Hematocrit 16.1 L 36.0-46.0 % SLN  MCV 83.9 78.0-100.0 fL SLN  MCH 27.9 26.0-34.0 pg SLN  MCHC 33.2 30.0-36.0 g/dL SLN  RDW 09.6 04.5-40.9 % SLN  Platelet Count 269 150-400 K/uL SLN  Basic Metabolic Panel  Result: 09/09/2012 11:57 AM ( Status: F )  Sodium 135 135-145 mEq/L SLN  Potassium 4.2 3.5-5.3 mEq/L SLN  Chloride 102 96-112 mEq/L SLN  CO2 25 19-32 mEq/L SLN  Glucose 280 H 70-99 mg/dL SLN  BUN 28 H 8-11 mg/dL SLN  Creatinine 9.14 H 0.50-1.10 mg/dL SLN  Calcium 8.3 L 7.8-29.5 mg/dL SLN  Lipid Profile  Result: 09/09/2012 11:57 AM ( Status: F )  Cholesterol 237 H 0-200 mg/dL SLN C  Triglyceride 621 H <150 mg/dL SLN  HDL Cholesterol 29 L >39 mg/dL SLN  Total Chol/HDL Ratio 8.2 Ratio SLN  VLDL Cholesterol (Calc) NOT CALC 0-40 mg/dL SLN C  LDL Cholesterol  (Calc) 0-99 mg/dL SLN C  Liver Profile  Result: 09/09/2012 11:57 AM ( Status: F )  Bilirubin, Total 0.2 L 0.3-1.2 mg/dL SLN  Bilirubin, Direct 0.1 0.0-0.3 mg/dL SLN  Indirect Bilirubin 0.1 0.0-0.9 mg/dL SLN  Alkaline Phosphatase 90 39-117 U/L SLN  AST/SGOT 13 0-37 U/L SLN  ALT/SGPT <8 0-35 U/L SLN  Total Protein 6.1 6.0-8.3 g/dL SLN  Albumin 3.1 L 3.0-8.6 g/dL SLN  Hemoglobin V7Q  Result: 09/09/2012 2:16 PM ( Status: F )  Hemoglobin A1C 9.6 H <5.7 % SLN C  Estimated Average Glucose 229 H    Assessment/Plan  Other and unspecified hyperlipidemia- will follow up lipids in 2 weeks  HYPERTENSION Patient is stable; continue current regimen. Will monitor and make changes as necessary.  DIABETES MELLITUS, TYPE II- pt remains non-compliant with diet- will increase lantus to 34 units BID  URINARY INCONTINENCE- cont detrol 4 mg ER daily     Labs/tests ordered

## 2012-11-10 ENCOUNTER — Other Ambulatory Visit: Payer: Self-pay | Admitting: Geriatric Medicine

## 2012-11-10 MED ORDER — FENTANYL 50 MCG/HR TD PT72: MEDICATED_PATCH | TRANSDERMAL | Status: DC

## 2012-11-29 ENCOUNTER — Emergency Department (HOSPITAL_COMMUNITY): Payer: Medicare Other

## 2012-11-29 ENCOUNTER — Encounter (HOSPITAL_COMMUNITY): Payer: Self-pay | Admitting: Nurse Practitioner

## 2012-11-29 ENCOUNTER — Emergency Department (HOSPITAL_COMMUNITY)
Admission: EM | Admit: 2012-11-29 | Discharge: 2012-11-29 | Disposition: A | Discharge status: Skilled Nursing Facility | Payer: Medicare Other | Attending: Emergency Medicine | Admitting: Emergency Medicine

## 2012-11-29 DIAGNOSIS — Z8669 Personal history of other diseases of the nervous system and sense organs: Secondary | ICD-10-CM | POA: Insufficient documentation

## 2012-11-29 DIAGNOSIS — S79919A Unspecified injury of unspecified hip, initial encounter: Secondary | ICD-10-CM | POA: Insufficient documentation

## 2012-11-29 DIAGNOSIS — S79929A Unspecified injury of unspecified thigh, initial encounter: Secondary | ICD-10-CM | POA: Insufficient documentation

## 2012-11-29 DIAGNOSIS — Z862 Personal history of diseases of the blood and blood-forming organs and certain disorders involving the immune mechanism: Secondary | ICD-10-CM | POA: Insufficient documentation

## 2012-11-29 DIAGNOSIS — I10 Essential (primary) hypertension: Secondary | ICD-10-CM | POA: Insufficient documentation

## 2012-11-29 DIAGNOSIS — Z794 Long term (current) use of insulin: Secondary | ICD-10-CM | POA: Insufficient documentation

## 2012-11-29 DIAGNOSIS — E119 Type 2 diabetes mellitus without complications: Secondary | ICD-10-CM | POA: Insufficient documentation

## 2012-11-29 DIAGNOSIS — Z86718 Personal history of other venous thrombosis and embolism: Secondary | ICD-10-CM | POA: Insufficient documentation

## 2012-11-29 DIAGNOSIS — Z8673 Personal history of transient ischemic attack (TIA), and cerebral infarction without residual deficits: Secondary | ICD-10-CM | POA: Insufficient documentation

## 2012-11-29 DIAGNOSIS — Z79899 Other long term (current) drug therapy: Secondary | ICD-10-CM | POA: Insufficient documentation

## 2012-11-29 DIAGNOSIS — Y9210 Unspecified residential institution as the place of occurrence of the external cause: Secondary | ICD-10-CM | POA: Insufficient documentation

## 2012-11-29 DIAGNOSIS — Z8679 Personal history of other diseases of the circulatory system: Secondary | ICD-10-CM | POA: Insufficient documentation

## 2012-11-29 DIAGNOSIS — Y939 Activity, unspecified: Secondary | ICD-10-CM | POA: Insufficient documentation

## 2012-11-29 DIAGNOSIS — G8190 Hemiplegia, unspecified affecting unspecified side: Secondary | ICD-10-CM | POA: Insufficient documentation

## 2012-11-29 DIAGNOSIS — Z7982 Long term (current) use of aspirin: Secondary | ICD-10-CM | POA: Insufficient documentation

## 2012-11-29 DIAGNOSIS — E039 Hypothyroidism, unspecified: Secondary | ICD-10-CM | POA: Insufficient documentation

## 2012-11-29 DIAGNOSIS — W050XXA Fall from non-moving wheelchair, initial encounter: Secondary | ICD-10-CM | POA: Insufficient documentation

## 2012-11-29 DIAGNOSIS — Z8614 Personal history of Methicillin resistant Staphylococcus aureus infection: Secondary | ICD-10-CM | POA: Insufficient documentation

## 2012-11-29 DIAGNOSIS — F329 Major depressive disorder, single episode, unspecified: Secondary | ICD-10-CM | POA: Insufficient documentation

## 2012-11-29 DIAGNOSIS — W19XXXA Unspecified fall, initial encounter: Secondary | ICD-10-CM

## 2012-11-29 DIAGNOSIS — F3289 Other specified depressive episodes: Secondary | ICD-10-CM | POA: Insufficient documentation

## 2012-11-29 DIAGNOSIS — Z8719 Personal history of other diseases of the digestive system: Secondary | ICD-10-CM | POA: Insufficient documentation

## 2012-11-29 LAB — GLUCOSE, CAPILLARY: Glucose-Capillary: 230 mg/dL — ABNORMAL HIGH (ref 70–99)

## 2012-11-29 MED ORDER — HYDROMORPHONE HCL PF 1 MG/ML IJ SOLN
1.0000 mg | Freq: Once | INTRAMUSCULAR | Status: AC
  Administered 2012-11-29: 1 mg via INTRAVENOUS
  Filled 2012-11-29: qty 1

## 2012-11-29 MED ORDER — ONDANSETRON HCL 4 MG/2ML IJ SOLN
4.0000 mg | Freq: Once | INTRAMUSCULAR | Status: AC
  Administered 2012-11-29: 4 mg via INTRAVENOUS
  Filled 2012-11-29: qty 2

## 2012-11-29 NOTE — ED Notes (Signed)
Pt. From [facility]- communicates with nodding and pencil and paper. sts was using electric wheelchair and fell off chair and landed on right stump and buttocks. Pt denies LOC, head injury, dizziness or lightheadedness. Pt sts she doesn't know how she fell. Pt alert and oriented. Neuro intact. Tenderness to bilateral lower extremities and difficulty moving extremities without grimacing

## 2012-11-29 NOTE — ED Provider Notes (Signed)
CSN: [id number]     Arrival date & time 11/29/12  1712 History     First MD Initiated Contact with Patient 11/29/12 1713     Chief Complaint  Patient presents with  . Fall   (Consider location/radiation/quality/duration/timing/severity/associated sxs/prior Treatment) HPI Comments: Patient with history of stroke, expressive aphasia, right AKA -- presents after falling from a motorized wheelchair. Per nursing home report there was no loss of consciousness, head injury, or neck injury patient states that she currently has pain in her left hip, buttocks, right hip, right stump. No other treatments prior to arrival. Patient communicates minimally with speech, mostly writing at baseline. Onset of symptoms acute. Course is constant. Nothing makes symptoms better. Movement makes the pain worse.  Patient is a 67 y.o. female presenting with fall. The history is provided by the patient and medical records.  Fall Associated symptoms include arthralgias and myalgias. Pertinent negatives include no abdominal pain, chest pain, coughing, fatigue, fever, headaches, nausea, neck pain, numbness, rash, sore throat, vomiting or weakness.    Past Medical History  Diagnosis Date  . 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 Diabetes mellitus     Prone to hypoglycemia  . Aphagia     Status post numerous  . Dysphagia   . Peripheral vascular disease, unspecified   . Arterial disease   . Depressive disorder   . Hypothyroid   . Seizures   . MRSA infection     History of MRSA in the left leg wound and sacrum in the past-has been seen by vascular surgery and declined any type of intervention including arteriogram  . Diverticulosis     GI bleed in 2007-s/p endoscopy and colonoscopy in 2007 s/p massive GIB consittent with Diveerticulosis  . DVT (deep venous thrombosis)     Not on  Coumadin  . Anemia     Chronic  . Non-smoker   . Stroke   . Hypertension    Past Surgical History  Procedure Laterality Date  . Above knee leg amputation    . Femoral-popliteal bypass graft     No family history on file. History  Substance Use Topics  . Smoking status: Never Smoker   . Smokeless tobacco: Not on file  . Alcohol Use: No   OB History   Grav Para Term Preterm Abortions TAB SAB Ect Mult Living                 Review of Systems  Constitutional: Negative for fever and fatigue.  HENT: Negative for sore throat, rhinorrhea, neck pain and tinnitus.   Eyes: Negative for photophobia, pain, redness and visual disturbance.  Respiratory: Negative for cough and shortness of breath.   Cardiovascular: Negative for chest pain.  Gastrointestinal: Negative for nausea, vomiting, abdominal pain and diarrhea.  Genitourinary: Negative for dysuria.  Musculoskeletal: Positive for myalgias and arthralgias. Negative for back pain.  Skin: Negative for rash and wound.  Neurological: Negative for dizziness, weakness, light-headedness, numbness and headaches.  Psychiatric/Behavioral: Negative for confusion and decreased concentration.    Allergies  Review of patient's allergies indicates no known allergies.  Home Medications   Current Outpatient Rx  Name  Route  Sig  Dispense  Refill  . aspirin 81 MG tablet   Oral   Take 81 mg by mouth daily.         Marland Kitchen  atorvastatin (LIPITOR) 40 MG tablet   Oral   Take 40 mg by mouth at bedtime.         . cilostazol (PLETAL) 100 MG tablet   Oral   Take 100 mg by mouth 2 (two) times daily.           . cloNIDine (CATAPRES) 0.1 MG tablet   Oral   Take 0.1 mg by mouth 3 (three) times daily.           . fentaNYL (DURAGESIC - DOSED MCG/HR) 50 MCG/HR      Apply one patch topically every 72 hours *Rotate Sites* Nurses must check placement every shift and notify (DON) if missing   10 patch   0   . fish oil-omega-3 fatty acids 1000 MG  capsule   Oral   Take 1 g by mouth daily.         . furosemide (LASIX) 20 MG tablet   Oral   Take 40 mg by mouth daily.         Marland Kitchen gabapentin (NEURONTIN) 300 MG capsule   Oral   Take 300 mg by mouth 3 (three) times daily.           . insulin aspart (NOVOLOG) 100 UNIT/ML injection   Subcutaneous   Inject 5 Units into the skin daily with supper. Give if patient eats >50% of meals          . insulin aspart (NOVOLOG) 100 UNIT/ML injection   Subcutaneous   Inject 5-15 Units into the skin 3 (three) times daily before meals. 5 units before meals and an additional 5 units if cbg is greater than or equal to 150 Give 10 units for cbg 150-300 15 units for cbg >300         . insulin glargine (LANTUS) 100 UNIT/ML injection   Subcutaneous   Inject 32 Units into the skin 2 (two) times daily.          Marland Kitchen EXPIRED: isosorbide mononitrate (IMDUR) 30 MG 24 hr tablet   Oral   Take 30 mg by mouth daily.         Marland Kitchen levothyroxine (SYNTHROID, LEVOTHROID) 125 MCG tablet   Oral   Take 125 mcg by mouth daily.           Marland Kitchen loratadine (CLARITIN) 10 MG tablet   Oral   Take 10 mg by mouth daily. For allergies          . LORazepam (ATIVAN) 0.5 MG tablet      Take 1 tablet every 8 hours as needed   90 tablet   5   . LORazepam (ATIVAN) 0.5 MG tablet   Oral   Take 1 tablet (0.5 mg total) by mouth every 8 (eight) hours.   90 tablet   3   . losartan (COZAAR) 25 MG tablet   Oral   Take 25 mg by mouth 2 (two) times daily.         . metaxalone (SKELAXIN) 800 MG tablet   Oral   Take 800 mg by mouth 3 (three) times daily.           Marland Kitchen EXPIRED: metoprolol tartrate (LOPRESSOR) 25 MG tablet   Oral   Take 25 mg by mouth 2 (two) times daily.         . nitroGLYCERIN (NITROSTAT) 0.4 MG SL tablet   Sublingual   Place 0.4 mg under the tongue every 5 (five) minutes as needed. For chest pain          .  oxyCODONE (OXY IR/ROXICODONE) 5 MG immediate release tablet      Take 2 every 4  hours as neede   360 tablet   0   . polyethylene glycol (MIRALAX / GLYCOLAX) packet   Oral   Take 17 g by mouth daily. Mix in 8 ounces of water; for constipation          . senna (SENOKOT) 8.6 MG TABS   Oral   Take 2 tablets by mouth daily.           Marland Kitchen tolterodine (DETROL LA) 4 MG 24 hr capsule   Oral   Take 4 mg by mouth 2 (two) times daily. Takes at 8 AM and 4 PM          . zolpidem (AMBIEN) 5 MG tablet      Take one tablet by mouth at bedtime as needed for sleep.   30 tablet   3    BP 186/67  Pulse 86  Temp(Src) 98.5 F (36.9 C) (Oral)  Resp 26  SpO2 95% Physical Exam  Nursing note and vitals reviewed. Constitutional: She is oriented to person, place, and time. She appears well-developed and well-nourished.  HENT:  Head: Normocephalic and atraumatic. Head is without raccoon's eyes and without Battle's sign.  Right Ear: Tympanic membrane, external ear and ear canal normal. No hemotympanum.  Left Ear: Tympanic membrane, external ear and ear canal normal. No hemotympanum.  Nose: Nose normal. No nasal septal hematoma.  Mouth/Throat: Uvula is midline, oropharynx is clear and moist and mucous membranes are normal.  Eyes: Conjunctivae, EOM and lids are normal. Pupils are equal, round, and reactive to light. Right eye exhibits no nystagmus. Left eye exhibits no nystagmus.  No visible hyphema noted  Neck: Normal range of motion. Neck supple.  Cardiovascular: Normal rate and regular rhythm.   Pulmonary/Chest: Effort normal and breath sounds normal.  Abdominal: Soft. There is no tenderness.  Musculoskeletal:       Right hip: She exhibits tenderness. She exhibits normal strength and no bony tenderness.       Left hip: She exhibits tenderness. She exhibits normal strength and no bony tenderness.       Cervical back: She exhibits normal range of motion, no tenderness and no bony tenderness.       Thoracic back: She exhibits no tenderness and no bony tenderness.        Lumbar back: She exhibits tenderness. She exhibits normal range of motion, no bony tenderness and no swelling.       Legs: Neurological: She is alert and oriented to person, place, and time. She has normal strength and normal reflexes. No cranial nerve deficit or sensory deficit. Coordination normal. GCS eye subscore is 4. GCS verbal subscore is 5. GCS motor subscore is 6.  Skin: Skin is warm and dry.  Psychiatric: She has a normal mood and affect.    ED Course   Procedures (including critical care time)  Labs Reviewed  GLUCOSE, CAPILLARY - Abnormal; Notable for the following:    Glucose-Capillary 230 (*)    All other components within normal limits   Dg Hip Bilateral W/pelvis  11/29/2012   *RADIOLOGY REPORT*  Clinical Data: 67 year old female with fall and bilateral hip pain.  BILATERAL HIP WITH PELVIS - 4+ VIEW  Comparison: 09/19 and 01/17/2010 radiographs  Findings: There is no evidence of acute fracture, subluxation or dislocation. No focal bony lesions are identified. The joint spaces are within normal limits. Diffuse osteopenia is noted.  IMPRESSION: No evidence of acute bony abnormality.   Original Report Authenticated By: Harmon Pier, M.D.   1. Leg pain, unspecified laterality   2. Fall, initial encounter     5:53 PM Patient seen and examined. Work-up initiated. Medications ordered.   Vital signs reviewed and are as follows: Filed Vitals:   11/29/12 1745  BP: 186/67  Pulse: 86  Temp:   Resp:   BP 186/67  Pulse 86  Temp(Src) 98.5 F (36.9 C) (Oral)  Resp 26  SpO2 95%  X-rays reviewed. Pt informed.   D/w Dr. Jeraldine Loots who saw patient. Agrees with d/c.   Pt on fentanyl patches, I do not feel comfortable prescribing additional narcotics for pain. Discussed with patient's daughter.   MDM  Fall. X-rays neg. No abdominal or thoracic injury suspected. No head or neck injury suspected. Continue chronic pain medications and conservative treatments.   Renne Crigler,  PA-C 11/29/12 2051

## 2012-11-29 NOTE — ED Notes (Signed)
Ivin Booty PA at bedside.

## 2012-11-29 NOTE — ED Notes (Signed)
Pt baseline is aphasic, alert and oriented. Pt from [facility]. Fell from wheelchair onto floor, landed on buttocks. No LOC or injury to head. C/o pain to bottom of right thigh (AKA) and left hip- painful with palpation but no obvious deformity.

## 2012-11-30 NOTE — ED Provider Notes (Signed)
  This was a shared visit with a mid-level provided (NP or PA).  Throughout the patient's course I was available for consultation/collaboration.  I saw the ECG (if appropriate), relevant labs and studies - I agree with the interpretation.  On my exam the patient was in no distress.      Gerhard Munch, MD 11/30/12 0001

## 2012-12-02 ENCOUNTER — Non-Acute Institutional Stay (SKILLED_NURSING_FACILITY): Payer: Medicare Other | Admitting: Nurse Practitioner

## 2012-12-02 ENCOUNTER — Encounter: Payer: Self-pay | Admitting: Nurse Practitioner

## 2012-12-02 DIAGNOSIS — I10 Essential (primary) hypertension: Secondary | ICD-10-CM

## 2012-12-02 DIAGNOSIS — I739 Peripheral vascular disease, unspecified: Secondary | ICD-10-CM

## 2012-12-02 DIAGNOSIS — R32 Unspecified urinary incontinence: Secondary | ICD-10-CM

## 2012-12-02 DIAGNOSIS — E785 Hyperlipidemia, unspecified: Secondary | ICD-10-CM

## 2012-12-02 DIAGNOSIS — G894 Chronic pain syndrome: Secondary | ICD-10-CM

## 2012-12-02 NOTE — Progress Notes (Signed)
Patient ID: Allison Washington, female   DOB: 1945/06/05, 67 y.o.   MRN: [id number]  Nursing Home Location:  [facility]   Place of Service: SNF (31)  Chief Complaint  Patient presents with  . Medical Managment of Chronic Issues    HPI:  67 year old female with multiple medication conditions who is a long term resident of [place]. Pt without any complaints today and staff does not report any new concerns. Pt recently sent to ER due to falling out of motorized WC she reported knee pain at the time but now this is much better.  HYPOTHYROIDISM The hypothyroidism is stable.No complications noted from the medication presently being used. is taking synthroid 112 mcg daily.  DM, COMP CIRCULATION TYPE II UNCONTROLLED The diabetes is unchanged-- the blood glucoses have not been satisfactory. Pt noncompliant with diet. no low blood sugars noted. No complications noted from the medication presently being used.  HYPERLIPIDEMIA .No complications noted from the medication presently being used.takes Lipitor 40 mg nightly, gemfibrozil 600 mg BID and fish oil 1 gm PO BID - cholesterol remains elelvated- pt noncompliant with diet  ANXIETY The anxiety remains stable.No complications noted from the medication presently being used. ativan 0.5 mg every 8 hours as needed  PAIN, CHRONIC well controlled on pain medication- takes duragesic patch every 72 hours; takes neurontin 300 mg tid; takes skelaxin 800 mg three times daily takes oxycodone 10 mg every 4 hour as needed.  HYPERTENSION, BENIGN The blood pressure readings since the last visit have been in the target range. No complications noted from the medication presently being used.takes losartan 25mg  BID, clonidine 0.1 mg three times daily takes lopressor 25 mg twice daily takes imdur 30 mg daily  CONGESTIVE HEART FAILURE The congestive heart failure is stable. The patient's relates no significant weight change and denies worsening dyspnea on  exertion, orthopnea, paroxysmal nocturnal dyspnea, pedal edema, palpitations, or chest pain.taking lasix 40mg  daily  PVD The patient's claudication remains stable.No complications noted from the medication presently being used. takes asa 81 mg 4 tablets daily, takes pletal 100 mg twice daily; reports no symptoms of claudication  RHINITIS, ALLERGIC The allergic rhinitis symptoms are stable.No complications noted from the medication presently being used.takes claritin daily as needed CONSTIPATION The symptoms are stable.The medication is well tolerated. No other therapies have been tried.takes senna s 2 tabs daily and miralax daily  INSOMNIA, UNSPECIFIED stable is taking ambien 5 mg nightly as needed INCONTINENCE, URINARY The urinary incontinence is better .No complications noted from the medication presently being used. Now takes myrbetriq 50 mg daily which has improved incontinence symptoms   Review of Systems:   DATA OBTAINED: from patient, nurse, medical record GENERAL: Feels well no fevers, fatigue, appetite changes SKIN: No itching, rash or wounds EYES: No eye pain, redness, discharge EARS: No earache, tinnitus, change in hearing NOSE: No congestion, drainage or bleeding  MOUTH/THROAT: No mouth or tooth pain, No sore throat, No difficulty chewing or swallowing  RESPIRATORY: No cough, wheezing, SOB CARDIAC: No chest pain recent or current, palpitations, lower extremity edema GI: No abdominal pain, No N/V/D or constipation, No heartburn or reflux  GU: No dysuria, frequency or urgency, or incontinence; freq improved  MUSCULOSKELETAL: No unrelieved bone/joint pain NEUROLOGIC: Awake, alert, appropriate to situation, No change in mental status. Moves all four, no focal deficits PSYCHIATRIC: No overt anxiety or sadness. Sleeps well. No behavior issue.  AMBULATION:   motiorized WC   Medications: Patient's Medications  New  Prescriptions   No medications on file  Previous Medications    ACETAMINOPHEN (TYLENOL) 325 MG TABLET    Take 650 mg by mouth every 6 (six) hours as needed for pain.   ASPIRIN 81 MG TABLET    Take 81 mg by mouth daily.   ATORVASTATIN (LIPITOR) 40 MG TABLET    Take 40 mg by mouth at bedtime.   CILOSTAZOL (PLETAL) 100 MG TABLET    Take 100 mg by mouth 2 (two) times daily.     CLONIDINE (CATAPRES) 0.1 MG TABLET    Take 0.1 mg by mouth 3 (three) times daily.     FENTANYL (DURAGESIC - DOSED MCG/HR) 50 MCG/HR    Apply one patch topically every 72 hours *Rotate Sites* Nurses must check placement every shift and notify (DON) if missing   FISH OIL-OMEGA-3 FATTY ACIDS 1000 MG CAPSULE    Take 1 g by mouth daily.   FUROSEMIDE (LASIX) 20 MG TABLET    Take 40 mg by mouth daily.   GABAPENTIN (NEURONTIN) 300 MG CAPSULE    Take 300 mg by mouth 3 (three) times daily.     GEMFIBROZIL (LOPID) 600 MG TABLET    Take 600 mg by mouth 2 (two) times daily before a meal.   INSULIN ASPART (NOVOLOG) 100 UNIT/ML INJECTION    Inject 5 Units into the skin 3 (three) times daily before meals. Give if patient eats >50% of meals   INSULIN ASPART (NOVOLOG) 100 UNIT/ML INJECTION    Inject 5-15 Units into the skin 3 (three) times daily before meals. 5 units before meals and an additional 5 units if cbg is greater than or equal to 150 Give 10 units for cbg 150-300 15 units for cbg >300   INSULIN GLARGINE (LANTUS) 100 UNIT/ML INJECTION    Inject 32 Units into the skin 2 (two) times daily.    ISOSORBIDE MONONITRATE (IMDUR) 30 MG 24 HR TABLET    Take 30 mg by mouth daily.   LEVOTHYROXINE (SYNTHROID, LEVOTHROID) 112 MCG TABLET    Take 112 mcg by mouth daily before breakfast.   LORATADINE (CLARITIN) 10 MG TABLET    Take 10 mg by mouth daily as needed. For allergies   LORAZEPAM (ATIVAN) 0.5 MG TABLET    Take 1 tablet every 8 hours as needed   LOSARTAN (COZAAR) 25 MG TABLET    Take 25 mg by mouth 2 (two) times daily.   METAXALONE (SKELAXIN) 800 MG TABLET    Take 800 mg by mouth 3 (three) times daily.      METOPROLOL TARTRATE (LOPRESSOR) 25 MG TABLET    Take 25 mg by mouth 2 (two) times daily.   MIRABEGRON ER (MYRBETRIQ) 50 MG TB24 TABLET    Take 50 mg by mouth daily.   NITROGLYCERIN (NITROSTAT) 0.4 MG SL TABLET    Place 0.4 mg under the tongue every 5 (five) minutes as needed. For chest pain    OXYCODONE (OXY IR/ROXICODONE) 5 MG IMMEDIATE RELEASE TABLET    Take 10 mg by mouth every 4 (four) hours as needed for pain.   POLYETHYLENE GLYCOL (MIRALAX / GLYCOLAX) PACKET    Take 17 g by mouth daily. Mix in 8 ounces of water; for constipation    SENNA (SENOKOT) 8.6 MG TABS    Take 2 tablets by mouth daily.     SODIUM CHLORIDE (OCEAN) 0.65 % NASAL SPRAY    Place 1 spray into the nose 2 (two) times daily as needed for congestion.  Modified Medications   No medications on file  Discontinued Medications   LORAZEPAM (ATIVAN) 0.5 MG TABLET    Take 1 tablet (0.5 mg total) by mouth every 8 (eight) hours.   TOLTERODINE (DETROL LA) 4 MG 24 HR CAPSULE    Take 4 mg by mouth 2 (two) times daily. Takes at 8 AM and 4 PM      Physical Exam:  Filed Vitals:   12/02/12 1626  BP: 144/73  Pulse: 84  Temp: 98.4 F (36.9 C)  Resp: 20  Weight: 173 lb (78.472 kg)     GENERAL APPEARANCE: Alert, conversant. Appropriately groomed. No acute distress.  SKIN: No diaphoresis rash, or wounds HEAD: Normocephalic, atraumatic  EYES: Conjunctiva/lids clear. Pupils round, reactive. EOMs intact.  EARS: External exam WNL. Hearing grossly normal.  NOSE: No deformity or discharge.  MOUTH/THROAT: Lips w/o lesions. Mouth and throat normal. Tongue moist, w/o lesion.  NECK: No thyroid tenderness, enlargement or nodule  RESPIRATORY: Breathing is even, unlabored. Lung sounds are clear   CARDIOVASCULAR: Heart RRR no murmurs, rubs or gallops. No peripheral edema.  ARTERIAL: radial pulse 2+, DP pulse 1+  GASTROINTESTINAL: Abdomen is soft, non-tender, not distended w/ normal bowel sounds. GENITOURINARY: Bladder non tender, not  distended  MUSCULOSKELETAL: No abnormal joints or musculature NEUROLOGIC: Oriented X3. Cranial nerves 2-12 grossly intact. Moves all extremities no tremor. PSYCHIATRIC: Mood and affect appropriate to situation, no behavioral issues  Labs reviewed/Significant Diagnostic Results: Lipid Profile       Result: 11/20/2012 4:00 PM    ( Status: F )            Cholesterol  250     H  0-200  mg/dL  SLN  C     Triglyceride  219     H  <150  mg/dL  SLN       HDL Cholesterol  35     L  >39  mg/dL  SLN       Total Chol/HDL Ratio  7.1         Ratio  SLN       VLDL Cholesterol (Calc)  44     H  0-40  mg/dL  SLN       LDL Cholesterol (Calc)  171     H        Assessment/Plan 1. HYPERTENSION Patients blood pressure is stable; at this time will stop imdur pt is not having chest pains. Will monitor and make changes as necessary. Will also change clonidine to patch  2. URINARY INCONTINENCE improved on myrbetriq  3. PVD (peripheral vascular disease) Stable; pt without complaints. Will dc cilostazol at this time  4. Chronic pain syndrome Pt reports pain is well controlled; denies muscle pain; will make skelaxin PRN at this time  5. Hyperlipidemia Unchanged Will increase lipitor to 60 mg at this time

## 2012-12-10 ENCOUNTER — Other Ambulatory Visit: Payer: Self-pay | Admitting: Geriatric Medicine

## 2012-12-10 MED ORDER — FENTANYL 50 MCG/HR TD PT72: MEDICATED_PATCH | TRANSDERMAL | Status: DC

## 2012-12-26 ENCOUNTER — Other Ambulatory Visit: Payer: Self-pay | Admitting: *Deleted

## 2012-12-26 MED ORDER — ZOLPIDEM TARTRATE 5 MG PO TABS: ORAL_TABLET | ORAL | Status: DC

## 2012-12-30 ENCOUNTER — Non-Acute Institutional Stay (SKILLED_NURSING_FACILITY): Payer: Medicare Other | Admitting: Nurse Practitioner

## 2012-12-30 DIAGNOSIS — I10 Essential (primary) hypertension: Secondary | ICD-10-CM

## 2012-12-30 DIAGNOSIS — E785 Hyperlipidemia, unspecified: Secondary | ICD-10-CM

## 2012-12-30 DIAGNOSIS — R32 Unspecified urinary incontinence: Secondary | ICD-10-CM

## 2012-12-30 DIAGNOSIS — E119 Type 2 diabetes mellitus without complications: Secondary | ICD-10-CM

## 2012-12-30 NOTE — Progress Notes (Signed)
Patient ID: Allison Washington, female   DOB: Nov 16, 1945, 67 y.o.   MRN: [id number]  Nursing Home Location:  [facility]   Place of Service: SNF (31)  Chief Complaint  Patient presents with  . Medical Managment of Chronic Issues    HPI:  67 year old female with multiple medication conditions who is a long term resident of [place]. Pt without any complaints today and staff does not report any new concerns. Doing well with recent medication changes   HYPOTHYROIDISM The hypothyroidism is stable.No complications noted from the medication presently being used. is taking synthroid 112 mcg daily.  DM, COMP CIRCULATION TYPE II UNCONTROLLED The diabetes is improved-- the blood glucoses have been better however now with some low blood sugars. Pt noncompliant with diet. No complications noted from the medication presently being used.  HYPERLIPIDEMIA .No complications noted from the medication presently being used.takes Lipitor 60 mg nightly, gemfibrozil 600 mg BID and fish oil 1 gm PO BID - cholesterol remains elelvated- pt noncompliant with diet  ANXIETY The anxiety remains stable.No complications noted from the medication presently being used. ativan 0.5 mg every 8 hours as needed  PAIN, CHRONIC well controlled on pain medication- takes duragesic patch every 72 hours; takes neurontin 300 mg tid; takes skelaxin 800 mg three times daily PRN takes oxycodone 10 mg every 4 hour as needed.  HYPERTENSION, BENIGN The blood pressure readings since the last visit have been in the target range. No complications noted from the medication presently being used.takes losartan 25mg  BID, clonidine 0.1 mg patch takes lopressor 25 mg twice daily  CONGESTIVE HEART FAILURE The congestive heart failure is stable. The patient's relates no significant weight change and denies worsening dyspnea on exertion, orthopnea, paroxysmal nocturnal dyspnea, pedal edema, palpitations, or chest pain.taking lasix 40mg   daily  PVD The patient's claudication remains stable.No complications noted from the medication presently being used. takes asa 81 mg 4 tablets daily, reports no symptoms of claudication  RHINITIS, ALLERGIC The allergic rhinitis symptoms are stable.No complications noted from the medication presently being used.takes claritin daily as needed  CONSTIPATION The symptoms are stable.The medication is well tolerated. No other therapies have been tried.takes senna s 2 tabs daily and miralax daily  INSOMNIA, UNSPECIFIED stable is taking ambien 5 mg nightly as needed  INCONTINENCE, URINARY The urinary incontinence is better .No complications noted from the medication presently being used. Now takes myrbetriq 50 mg daily which has improved incontinence symptoms    Review of Systems:  DATA OBTAINED: from patient, nurse, medical record  GENERAL: Feels well no fevers, fatigue, appetite changes  SKIN: No itching, rash or wounds  EYES: No eye pain, redness, discharge  EARS: No earache, tinnitus, change in hearing  NOSE: No congestion, drainage or bleeding  MOUTH/THROAT: No mouth or tooth pain, No sore throat, No difficulty chewing or swallowing  RESPIRATORY: No cough, wheezing, SOB  CARDIAC: No chest pain recent or current, palpitations, lower extremity edema  GI: No abdominal pain, No N/V/D or constipation, No heartburn or reflux  GU: No dysuria, frequency or urgency, or incontinence; freq improved  MUSCULOSKELETAL: No unrelieved bone/joint pain  NEUROLOGIC: Awake, alert, appropriate to situation, No change in mental status. Moves all four, no focal deficits  PSYCHIATRIC: No overt anxiety or sadness. Sleeps well. No behavior issue.  AMBULATION: motiorized WC   Medications: Patient's Medications  New Prescriptions   No medications on file  Previous Medications   ACETAMINOPHEN (TYLENOL) 325 MG TABLET  Take 650 mg by mouth every 6 (six) hours as needed for pain.   ASPIRIN 81 MG TABLET    Take 81 mg  by mouth daily.   ATORVASTATIN (LIPITOR) 40 MG TABLET    Take 60 mg by mouth at bedtime.    CLONIDINE (CATAPRES - DOSED IN MG/24 HR) 0.1 MG/24HR PATCH    Place 1 patch onto the skin once a week.   FENTANYL (DURAGESIC - DOSED MCG/HR) 50 MCG/HR    Apply one patch topically every 72 hours *Rotate Sites* Nurses must check placement every shift and notify (DON) if missing   FISH OIL-OMEGA-3 FATTY ACIDS 1000 MG CAPSULE    Take 1 g by mouth daily.   FUROSEMIDE (LASIX) 20 MG TABLET    Take 40 mg by mouth daily.   GABAPENTIN (NEURONTIN) 300 MG CAPSULE    Take 300 mg by mouth 3 (three) times daily.     GEMFIBROZIL (LOPID) 600 MG TABLET    Take 600 mg by mouth 2 (two) times daily before a meal.   INSULIN ASPART (NOVOLOG) 100 UNIT/ML INJECTION    Inject 5 Units into the skin 3 (three) times daily before meals. Give if patient eats >50% of meals   INSULIN ASPART (NOVOLOG) 100 UNIT/ML INJECTION    Inject 5-15 Units into the skin 3 (three) times daily before meals. 5 units before meals and an additional 5 units if cbg is greater than or equal to 150 Give 10 units for cbg 150-300 15 units for cbg >300   INSULIN GLARGINE (LANTUS) 100 UNIT/ML INJECTION    Inject 34 Units into the skin 2 (two) times daily.    LEVOTHYROXINE (SYNTHROID, LEVOTHROID) 112 MCG TABLET    Take 112 mcg by mouth daily before breakfast.   LORATADINE (CLARITIN) 10 MG TABLET    Take 10 mg by mouth daily as needed. For allergies   LORAZEPAM (ATIVAN) 0.5 MG TABLET    Take 1 tablet every 8 hours as needed   LOSARTAN (COZAAR) 25 MG TABLET    Take 25 mg by mouth 2 (two) times daily.   METAXALONE (SKELAXIN) 800 MG TABLET    Take 800 mg by mouth 3 (three) times daily as needed.    METOPROLOL TARTRATE (LOPRESSOR) 25 MG TABLET    Take 25 mg by mouth 2 (two) times daily.   MIRABEGRON ER (MYRBETRIQ) 50 MG TB24 TABLET    Take 50 mg by mouth daily.   NITROGLYCERIN (NITROSTAT) 0.4 MG SL TABLET    Place 0.4 mg under the tongue every 5 (five) minutes as  needed. For chest pain    OXYCODONE (OXY IR/ROXICODONE) 5 MG IMMEDIATE RELEASE TABLET    Take 10 mg by mouth every 4 (four) hours as needed for pain.   POLYETHYLENE GLYCOL (MIRALAX / GLYCOLAX) PACKET    Take 17 g by mouth daily. Mix in 8 ounces of water; for constipation    SENNA (SENOKOT) 8.6 MG TABS    Take 2 tablets by mouth daily.     SODIUM CHLORIDE (OCEAN) 0.65 % NASAL SPRAY    Place 1 spray into the nose 2 (two) times daily as needed for congestion.   ZOLPIDEM (AMBIEN) 5 MG TABLET    Take one tablet by mouth at bedtime as needed for rest  Modified Medications   No medications on file  Discontinued Medications   CILOSTAZOL (PLETAL) 100 MG TABLET    Take 100 mg by mouth 2 (two) times daily.  CLONIDINE (CATAPRES) 0.1 MG TABLET    Take 0.1 mg by mouth 3 (three) times daily.     ISOSORBIDE MONONITRATE (IMDUR) 30 MG 24 HR TABLET    Take 30 mg by mouth daily.     Physical Exam:  Filed Vitals:   12/30/12 1606  BP: 152/77  Pulse: 82  Temp: 99 F (37.2 C)  Resp: 20  Weight: 170 lb (77.111 kg)    GENERAL APPEARANCE: Alert, conversant. Appropriately groomed. No acute distress.  SKIN: No diaphoresis rash, or wounds  HEAD: Normocephalic, atraumatic  EYES: Conjunctiva/lids clear. Pupils round, reactive. EOMs intact.  EARS: External exam WNL. Hearing grossly normal.  NOSE: No deformity or discharge.  MOUTH/THROAT: Lips w/o lesions. Mouth and throat normal. Tongue moist, w/o lesion.  NECK: No thyroid tenderness, enlargement or nodule  RESPIRATORY: Breathing is even, unlabored. Lung sounds are clear  CARDIOVASCULAR: Heart RRR no murmurs, rubs or gallops. No peripheral edema.  ARTERIAL: radial pulse 2+ GASTROINTESTINAL: Abdomen is soft, non-tender, not distended w/ normal bowel sounds.  GENITOURINARY: Bladder non tender, not distended  MUSCULOSKELETAL: right AKA NEUROLOGIC: Oriented X3. Cranial nerves 2-12 grossly intact. Moves all extremities no tremor.  PSYCHIATRIC: Mood and affect  appropriate to situation, no behavioral issues   Labs reviewed/Significant Diagnostic Results: Lipid Profile  Result: 11/20/2012 4:00 PM ( Status: F )  Cholesterol 250 H 0-200 mg/dL SLN C  Triglyceride 161 H <150 mg/dL SLN  HDL Cholesterol 35 L >39 mg/dL SLN  Total Chol/HDL Ratio 7.1 Ratio SLN  VLDL Cholesterol (Calc) 44 H 0-40 mg/dL SLN  LDL Cholesterol (Calc) 171 H      Assessment/Plan 1. HYPERTENSION Blood pressure elevated- will increase patch to clonidine patch 0.2 mg/day to change weekly- vs to be done daily- will follow up bmp and cbc  2. DIABETES MELLITUS, TYPE II Some low blood sugars will change lantus to 33 units BID and make sure a bedtime snack is offered will follow up A1c  3. URINARY INCONTINENCE Remains improved with myrbetriq  4. Other and unspecified hyperlipidemia Will follow up lipids

## 2013-01-15 ENCOUNTER — Other Ambulatory Visit: Payer: Self-pay | Admitting: *Deleted

## 2013-01-15 MED ORDER — FENTANYL 50 MCG/HR TD PT72: MEDICATED_PATCH | TRANSDERMAL | Status: DC

## 2013-01-20 ENCOUNTER — Other Ambulatory Visit: Payer: Self-pay

## 2013-01-20 MED ORDER — ZOLPIDEM TARTRATE 5 MG PO TABS: ORAL_TABLET | ORAL | Status: DC

## 2013-01-20 NOTE — Telephone Encounter (Signed)
Verified dose and instructions reflect manual request received by nursing home.   

## 2013-02-02 ENCOUNTER — Non-Acute Institutional Stay (SKILLED_NURSING_FACILITY): Payer: Medicare Other | Admitting: Nurse Practitioner

## 2013-02-02 DIAGNOSIS — I10 Essential (primary) hypertension: Secondary | ICD-10-CM

## 2013-02-02 DIAGNOSIS — E1149 Type 2 diabetes mellitus with other diabetic neurological complication: Secondary | ICD-10-CM

## 2013-02-02 DIAGNOSIS — G629 Polyneuropathy, unspecified: Secondary | ICD-10-CM | POA: Insufficient documentation

## 2013-02-02 DIAGNOSIS — E785 Hyperlipidemia, unspecified: Secondary | ICD-10-CM

## 2013-02-02 DIAGNOSIS — I6350 Cerebral infarction due to unspecified occlusion or stenosis of unspecified cerebral artery: Secondary | ICD-10-CM

## 2013-02-02 DIAGNOSIS — I639 Cerebral infarction, unspecified: Secondary | ICD-10-CM

## 2013-02-02 DIAGNOSIS — I509 Heart failure, unspecified: Secondary | ICD-10-CM

## 2013-02-02 DIAGNOSIS — G589 Mononeuropathy, unspecified: Secondary | ICD-10-CM

## 2013-02-02 NOTE — Progress Notes (Signed)
Patient ID: Allison Washington, female   DOB: 12-20-1945, 67 y.o.   MRN: [id number]   PCP: Kimber Relic, MD   No Known Allergies  Chief Complaint  Patient presents with  . Medical Managment of Chronic Issues    HPI:  67 year old female with multiple medication conditions who is a long term resident of [place]. Pt without any complains of increase burning; numbness and tingling in left foot and lower leg; for over week rating this a 10/10 per nursing and oxy IR not helping. No injury noted.   HYPOTHYROIDISM The hypothyroidism is stable.No complications noted from the medication presently being used. is taking synthroid 112 mcg daily.   DM, COMP CIRCULATION TYPE II UNCONTROLLED The diabetes is improved-- the blood glucoses have been better however remains with some low blood sugars. Pt noncompliant with diet. No complications noted from the medication presently being used.   HYPERLIPIDEMIA .No complications noted from the medication presently being used.takes Lipitor 60 mg nightly, gemfibrozil 600 mg BID and fish oil 1 gm PO BID - cholesterol remains elevated; LDL not a goal- pt noncompliant with diet  ANXIETY The anxiety remains stable.No complications noted from the medication presently being used. ativan 0.5 mg every 8 hours as needed  PAIN, CHRONIC worse with recent flare up in neuropathy- takes duragesic patch every 72 hours; takes neurontin 300 mg tid; takes skelaxin 800 mg three times daily PRN takes oxycodone 10 mg every 4 hour as needed.  HYPERTENSION, BENIGN The blood pressure readings since the last visit have been in the target range. No complications noted from the medication presently being used.takes losartan 25mg  BID, clonidine 0.2 mg patch takes lopressor 25 mg twice daily  CONGESTIVE HEART FAILURE The congestive heart failure is stable. The patient's relates no significant weight change and denies worsening dyspnea on exertion, orthopnea, paroxysmal nocturnal dyspnea,  pedal edema, palpitations, or chest pain.taking lasix 40mg  daily  PVD The patient's claudication remains stable.No complications noted from the medication presently being used. takes asa 81 mg 4 tablets daily, reports no symptoms of claudication  RHINITIS, ALLERGIC The allergic rhinitis symptoms are stable.No complications noted from the medication presently being used.takes claritin daily as needed  CONSTIPATION The symptoms are stable.The medication is well tolerated.takes senna s 2 tabs daily and miralax daily  INSOMNIA, UNSPECIFIED stable is taking ambien 5 mg nightly as needed  INCONTINENCE, URINARY The urinary incontinence is better .No complications noted from the medication presently being used. takes myrbetriq 50 mg daily which has improved incontinence symptoms   Review of Systems:  Review of Systems  Constitutional: Negative for fever, chills and malaise/fatigue.  HENT: Negative.   Respiratory: Negative for cough and shortness of breath.   Cardiovascular: Negative for chest pain, palpitations and leg swelling.  Gastrointestinal: Negative for abdominal pain, diarrhea and constipation.  Genitourinary: Negative for dysuria, urgency and frequency.  Musculoskeletal: Positive for myalgias.  Skin: Negative.   Neurological: Positive for tingling. Negative for sensory change and weakness.  Psychiatric/Behavioral: Negative for depression. The patient is not nervous/anxious and does not have insomnia.      Past Medical History  Diagnosis Date  . 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 Diabetes mellitus     Prone to hypoglycemia  . Aphagia     Status post numerous  . Dysphagia   . Peripheral vascular  disease, unspecified   . Arterial disease   . Depressive disorder   . Hypothyroid   . Seizures   . MRSA infection     History of MRSA in the left  leg wound and sacrum in the past-has been seen by vascular surgery and declined any type of intervention including arteriogram  . Diverticulosis     GI bleed in 2007-s/p endoscopy and colonoscopy in 2007 s/p massive GIB consittent with Diveerticulosis  . DVT (deep venous thrombosis)     Not on Coumadin  . Anemia     Chronic  . Non-smoker   . Stroke   . Hypertension    Past Surgical History  Procedure Laterality Date  . Above knee leg amputation    . Femoral-popliteal bypass graft     Social History:   reports that she has never smoked. She does not have any smokeless tobacco history on file. She reports that she does not drink alcohol or use illicit drugs.  No family history on file.  Medications: Patient's Medications  New Prescriptions   No medications on file  Previous Medications   ACETAMINOPHEN (TYLENOL) 325 MG TABLET    Take 650 mg by mouth every 6 (six) hours as needed for pain.   ASPIRIN 81 MG TABLET    Take 81 mg by mouth daily.   ATORVASTATIN (LIPITOR) 40 MG TABLET    Take 60 mg by mouth at bedtime.    CLONIDINE (CATAPRES - DOSED IN MG/24 HR) 0.2 MG/24HR PATCH    Place 1 patch onto the skin once a week.   FENTANYL (DURAGESIC - DOSED MCG/HR) 50 MCG/HR    Apply one patch topically every 72 hours *Rotate Sites* Nurses must check placement every shift and notify (DON) if missing   FISH OIL-OMEGA-3 FATTY ACIDS 1000 MG CAPSULE    Take 1 g by mouth daily.   FUROSEMIDE (LASIX) 20 MG TABLET    Take 40 mg by mouth daily.   GABAPENTIN (NEURONTIN) 300 MG CAPSULE    Take 300 mg by mouth 3 (three) times daily.     GEMFIBROZIL (LOPID) 600 MG TABLET    Take 600 mg by mouth 2 (two) times daily before a meal.   INSULIN ASPART (NOVOLOG) 100 UNIT/ML INJECTION    Inject 5 Units into the skin 3 (three) times daily before meals. Give if patient eats >50% of meals   INSULIN ASPART (NOVOLOG) 100 UNIT/ML INJECTION    Inject 5-15 Units into the skin 3 (three) times daily before meals. 5 units  before meals and an additional 5 units if cbg is greater than or equal to 150 Give 10 units for cbg 150-300 15 units for cbg >300   INSULIN GLARGINE (LANTUS) 100 UNIT/ML INJECTION    Inject 33 Units into the skin 2 (two) times daily.    LEVOTHYROXINE (SYNTHROID, LEVOTHROID) 112 MCG TABLET    Take 112 mcg by mouth daily before breakfast.   LORATADINE (CLARITIN) 10 MG TABLET    Take 10 mg by mouth daily as needed. For allergies   LORAZEPAM (ATIVAN) 0.5 MG TABLET    Take 1 tablet every 8 hours as needed   LOSARTAN (COZAAR) 25 MG TABLET    Take 25 mg by mouth 2 (two) times daily.   METAXALONE (SKELAXIN) 800 MG TABLET    Take 800 mg by mouth 3 (three) times daily as needed.    METOPROLOL TARTRATE (LOPRESSOR) 25 MG TABLET    Take 25 mg by mouth 2 (  two) times daily.   MIRABEGRON ER (MYRBETRIQ) 50 MG TB24 TABLET    Take 50 mg by mouth daily.   NITROGLYCERIN (NITROSTAT) 0.4 MG SL TABLET    Place 0.4 mg under the tongue every 5 (five) minutes as needed. For chest pain    OXYCODONE (OXY IR/ROXICODONE) 5 MG IMMEDIATE RELEASE TABLET    Take 10 mg by mouth every 4 (four) hours as needed for pain.   POLYETHYLENE GLYCOL (MIRALAX / GLYCOLAX) PACKET    Take 17 g by mouth daily. Mix in 8 ounces of water; for constipation    SENNA (SENOKOT) 8.6 MG TABS    Take 2 tablets by mouth daily.     SODIUM CHLORIDE (OCEAN) 0.65 % NASAL SPRAY    Place 1 spray into the nose 2 (two) times daily as needed for congestion.   ZOLPIDEM (AMBIEN) 5 MG TABLET    1/2 by mouth at bedtime as needed for Insomnia  Modified Medications   No medications on file  Discontinued Medications   CLONIDINE (CATAPRES - DOSED IN MG/24 HR) 0.1 MG/24HR PATCH    Place 1 patch onto the skin once a week.     Physical Exam:  Filed Vitals:   02/02/13 1105  BP: 131/79  Pulse: 74  Temp: 98.1 F (36.7 C)  Resp: 20    GENERAL APPEARANCE: Alert, conversant. Appropriately groomed. No acute distress.  SKIN: No diaphoresis rash, or wounds  HEENT:  unremarkable NECK: No thyroid tenderness, enlargement or nodule  RESPIRATORY: Breathing is even, unlabored. Lung sounds are clear  CARDIOVASCULAR: Heart RRR no murmurs, rubs or gallops. No peripheral edema.  ARTERIAL: radial pulse 2+  GASTROINTESTINAL: Abdomen is soft, non-tender, not distended w/ normal bowel sounds.  GENITOURINARY: Bladder non tender, not distended  MUSCULOSKELETAL: right AKA,  NEUROLOGIC: Oriented X3. Cranial nerves 2-12 grossly intact. Moves LLE; sensation intact to touch; no tremor.  PSYCHIATRIC: Mood and affect appropriate to situation, no behavioral    Labs reviewed: Basic Metabolic Panel:  Recent Labs  16/10/96 1001 02/10/12 0910 02/11/12 0505 02/12/12 0823  NA 141 147* 141 141  K 3.8 3.2* 4.2 3.9  CL 104 104 101 102  CO2 28 31 28 31   GLUCOSE 67* 49* 181* 119*  BUN 11 10 15 21   CREATININE 1.22* 1.07 1.24* 1.68*  CALCIUM 9.0 9.3 8.9 9.0  MG  --  1.9  --   --    Liver Function Tests:  Recent Labs  02/10/12 0910  AST 15  ALT 6  ALKPHOS 93  BILITOT 0.3  PROT 7.4  ALBUMIN 2.9*   No results found for this basename: LIPASE, AMYLASE,  in the last 8760 hours No results found for this basename: AMMONIA,  in the last 8760 hours CBC:  Recent Labs  02/09/12 1001 02/10/12 0910 02/11/12 0505  WBC 8.5 7.7 8.2  HGB 10.5* 11.6* 11.2*  HCT 33.4* 36.8 35.2*  MCV 85.4 85.6 85.0  PLT 248 299 291   Cardiac Enzymes:  Recent Labs  02/09/12 1253 02/09/12 2302  TROPONINI <0.30 <0.30   BNP: No components found with this basename: POCBNP,  CBG:  Recent Labs  02/12/12 0754 02/12/12 1126 11/29/12 1724  GLUCAP 133* 212* 230*  Lipid Profile  Result: 11/20/2012 4:00 PM ( Status: F )  Cholesterol 250 H 0-200 mg/dL SLN C  Triglyceride 045 H <150 mg/dL SLN  HDL Cholesterol 35 L >39 mg/dL SLN  Total Chol/HDL Ratio 7.1 Ratio SLN  VLDL Cholesterol (Calc) 44 H 0-40  mg/dL SLN  LDL Cholesterol (Calc) 171 H   BMP with Estimated GFR       Result:  12/31/2012 12:07 PM    ( Status: F )            Sodium  141        135-145  mEq/L  SLN       Potassium  3.8        3.5-5.3  mEq/L  SLN       Chloride  103        96-112  mEq/L  SLN       CO2  26        19-32  mEq/L  SLN       Glucose  173     H  70-99  mg/dL  SLN       BUN  14        6-23  mg/dL  SLN       Creatinine  1.46     H  0.50-1.10  mg/dL  SLN       Calcium  8.1     L  8.4-10.5  mg/dL  SLN       Est GFR, African American  44     L   mL/min  SLN       Est GFR, NonAfrican American  38     L   mL/min  SLN  C    CBC NO Diff (Complete Blood Count)       Result: 12/31/2012 1:07 PM    ( Status: F )            WBC  6.7        4.0-10.5  K/uL  SLN       RBC  3.85     L  3.87-5.11  MIL/uL  SLN       Hemoglobin  10.4     L  12.0-15.0  g/dL  SLN       Hematocrit  32.2     L  36.0-46.0  %  SLN       MCV  83.6        78.0-100.0  fL  SLN       MCH  27.0        26.0-34.0  pg  SLN       MCHC  32.3        30.0-36.0  g/dL  SLN       RDW  16.1        11.5-15.5  %  SLN       Platelet Count  284        150-400  K/uL  SLN      Lipid Profile       Result: 12/31/2012 12:07 PM    ( Status: F )            Cholesterol  206     H  0-200  mg/dL  SLN  C     Triglyceride  165     H  <150  mg/dL  SLN       HDL Cholesterol  36     L  >39  mg/dL  SLN       Total Chol/HDL Ratio  5.7         Ratio  SLN       VLDL Cholesterol (Calc)  33        0-40  mg/dL  SLN       LDL Cholesterol (Calc)  137     H  0-99  mg/dL  SLN  C    Hemoglobin R6E       Result: 12/31/2012 4:29 PM    ( Status: F )            Hemoglobin A1C  8.6     H  <5.7  %  SLN  C     Estimated Average Glucose  200     H  <117  mg/dL  SLN     Lipid Profile       Result: 01/13/2013 3:06 PM    ( Status: F )            Cholesterol  204     H  0-200  mg/dL  SLN  C     Triglyceride  117        <150  mg/dL  SLN       HDL Cholesterol  35     L  >39  mg/dL  SLN       Total Chol/HDL Ratio  5.8         Ratio  SLN       VLDL Cholesterol (Calc)  23        0-40  mg/dL   SLN       LDL Cholesterol (Calc)  146     H  0-99    Assessment/Plan 1. Other and unspecified hyperlipidemia Improved but still not at goal; will increase Lipitor to 80 mg daily at this time  2. DIABETES MELLITUS, TYPE II Pt A1C 8.6 improved since last A1c however still with some elevations is blood sugar; will adjust mealtime coverage at this time to 10 units with breakfast and lunch and 7 units with supper   3. CHF (congestive heart failure) Patient is stable; continue current regimen. Will monitor and make changes as necessary.  4. CVA (cerebral vascular accident) Patient is stable; continue current regimen. Will monitor and make changes as necessary.  5. HYPERTENSION Improved blood pressure with increase in catapres patch last month  6. Neuropathy Worse; oxy IR does not touch the pain; will increase gabapentin to 600 mg TID for neuropathy

## 2013-02-13 ENCOUNTER — Other Ambulatory Visit: Payer: Self-pay | Admitting: *Deleted

## 2013-02-13 MED ORDER — FENTANYL 50 MCG/HR TD PT72: MEDICATED_PATCH | TRANSDERMAL | Status: DC

## 2013-02-17 ENCOUNTER — Other Ambulatory Visit: Payer: Self-pay | Admitting: *Deleted

## 2013-02-17 MED ORDER — OXYCODONE HCL 5 MG PO TABS: ORAL_TABLET | ORAL | Status: DC

## 2013-02-26 ENCOUNTER — Other Ambulatory Visit: Payer: Self-pay

## 2013-02-26 MED ORDER — OXYCODONE HCL 5 MG PO TABS: ORAL_TABLET | ORAL | Status: DC

## 2013-02-26 NOTE — Telephone Encounter (Signed)
Rx was written on 02/17/13 by Dr.Pandey but rx was NOT signed

## 2013-03-10 ENCOUNTER — Non-Acute Institutional Stay (SKILLED_NURSING_FACILITY): Payer: Medicare Other | Admitting: Nurse Practitioner

## 2013-03-10 DIAGNOSIS — G629 Polyneuropathy, unspecified: Secondary | ICD-10-CM

## 2013-03-10 DIAGNOSIS — I10 Essential (primary) hypertension: Secondary | ICD-10-CM

## 2013-03-10 DIAGNOSIS — E1149 Type 2 diabetes mellitus with other diabetic neurological complication: Secondary | ICD-10-CM

## 2013-03-10 DIAGNOSIS — G589 Mononeuropathy, unspecified: Secondary | ICD-10-CM

## 2013-03-10 DIAGNOSIS — I509 Heart failure, unspecified: Secondary | ICD-10-CM

## 2013-03-10 DIAGNOSIS — E785 Hyperlipidemia, unspecified: Secondary | ICD-10-CM

## 2013-03-10 DIAGNOSIS — E1142 Type 2 diabetes mellitus with diabetic polyneuropathy: Secondary | ICD-10-CM

## 2013-03-10 DIAGNOSIS — I6350 Cerebral infarction due to unspecified occlusion or stenosis of unspecified cerebral artery: Secondary | ICD-10-CM

## 2013-03-10 DIAGNOSIS — I639 Cerebral infarction, unspecified: Secondary | ICD-10-CM

## 2013-03-10 NOTE — Progress Notes (Signed)
Patient ID: Allison Washington, female   DOB: Jan 26, 1946, 67 y.o.   MRN: [id number] Nursing Home Location:  [facility]   Place of Service: SNF (31)  PCP: Kimber Relic, MD  No Known Allergies  Chief Complaint  Patient presents with  . Medical Managment of Chronic Issues    HPI:  67 year old female with multiple medication conditions who is a long term resident of [place]. Pt without complains and staff has no concerns.   HYPOTHYROIDISM The hypothyroidism is stable.No complications noted from the medication presently being used. is taking synthroid 112 mcg daily.  DM, COMP CIRCULATION TYPE II UNCONTROLLED The diabetes is improved-- the blood glucoses have been better. Pt noncompliant with diet. No complications noted from the medication presently being used.  HYPERLIPIDEMIA .No complications noted from the medication presently being used.takes Lipitor 60 mg nightly, gemfibrozil 600 mg BID and fish oil 1 gm PO BID - cholesterol remains elevated; LDL not a goal- pt noncompliant with diet  ANXIETY The anxiety remains stable.No complications noted from the medication presently being used. ativan 0.5 mg every 8 hours as needed  PAIN, CHRONIC worse with recent flare up in neuropathy- takes duragesic patch every 72 hours; takes neurontin 600 mg tid;oxycodone 10 mg every 4 hour as needed.  HYPERTENSION, BENIGN The blood pressure readings since the last visit have been in the target range. No complications noted from the medication presently being used.takes losartan 25mg  BID, clonidine 0.2 mg patch takes lopressor 25 mg twice daily  CONGESTIVE HEART FAILURE The congestive heart failure is stable. The patient's relates no significant weight change and denies worsening dyspnea on exertion, orthopnea, paroxysmal nocturnal dyspnea, pedal edema, palpitations, or chest pain.taking lasix 40mg  daily  PVD The patient's claudication remains stable.No complications noted from the medication  presently being used. takes asa 81 mg 4 tablets daily, reports no symptoms of claudication  RHINITIS, ALLERGIC The allergic rhinitis symptoms are stable.No complications noted from the medication presently being used.takes claritin daily as needed  CONSTIPATION The symptoms are stable.The medication is well tolerated.takes senna s 2 tabs daily and miralax daily  INSOMNIA, UNSPECIFIED stable is taking ambien 5 mg nightly as needed  INCONTINENCE, URINARY The urinary incontinence is better .No complications noted from the medication presently being used. takes myrbetriq 50 mg daily which has improved incontinence symptoms   Review of Systems:  Review of Systems  Constitutional: Negative for fever, chills and malaise/fatigue.  HENT: Negative.   Respiratory: Negative for cough and shortness of breath.   Cardiovascular: Negative for chest pain, palpitations and leg swelling.  Gastrointestinal: Negative for abdominal pain, diarrhea and constipation.  Genitourinary: Negative for dysuria, urgency and frequency.  Musculoskeletal: Negative for myalgias.  Skin: Negative.   Neurological: Negative for tingling, sensory change and weakness.  Psychiatric/Behavioral: Negative for depression. The patient is not nervous/anxious and does not have insomnia.      Past Medical History  Diagnosis Date  . 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 Diabetes mellitus     Prone to hypoglycemia  . Aphagia     Status post numerous  . Dysphagia   . Peripheral vascular disease, unspecified   . Arterial disease   . Depressive disorder   . Hypothyroid   . Seizures   . MRSA infection     History of MRSA in  the left leg wound and sacrum in the past-has been seen by vascular surgery and declined any type of intervention including arteriogram  . Diverticulosis     GI bleed in 2007-s/p  endoscopy and colonoscopy in 2007 s/p massive GIB consittent with Diveerticulosis  . DVT (deep venous thrombosis)     Not on Coumadin  . Anemia     Chronic  . Non-smoker   . Stroke   . Hypertension    Past Surgical History  Procedure Laterality Date  . Above knee leg amputation    . Femoral-popliteal bypass graft     Social History:   reports that she has never smoked. She does not have any smokeless tobacco history on file. She reports that she does not drink alcohol or use illicit drugs.  No family history on file.  Medications: Patient's Medications  New Prescriptions   No medications on file  Previous Medications   ACETAMINOPHEN (TYLENOL) 325 MG TABLET    Take 650 mg by mouth every 6 (six) hours as needed for pain.   ASPIRIN 81 MG TABLET    Take 81 mg by mouth daily.   ATORVASTATIN (LIPITOR) 40 MG TABLET    Take 60 mg by mouth at bedtime.    CLONIDINE (CATAPRES - DOSED IN MG/24 HR) 0.2 MG/24HR PATCH    Place 1 patch onto the skin once a week.   FENTANYL (DURAGESIC - DOSED MCG/HR) 50 MCG/HR    Apply one patch topically every 72 hours *Rotate Sites* Nurses must check placement every shift and notify (DON) if missing   FISH OIL-OMEGA-3 FATTY ACIDS 1000 MG CAPSULE    Take 1 g by mouth daily.   FUROSEMIDE (LASIX) 20 MG TABLET    Take 40 mg by mouth daily.   GABAPENTIN (NEURONTIN) 300 MG CAPSULE    Take 300 mg by mouth 3 (three) times daily.     GEMFIBROZIL (LOPID) 600 MG TABLET    Take 600 mg by mouth 2 (two) times daily before a meal.   INSULIN ASPART (NOVOLOG) 100 UNIT/ML INJECTION    Inject 5-15 Units into the skin 3 (three) times daily before meals. Give 10 units units with breakfast and lunch and an additional 5 units if cbg is greater than or equal to 150 Give 10 units for cbg 150-300 15 units for cbg >300   INSULIN GLARGINE (LANTUS) 100 UNIT/ML INJECTION    Inject 33 Units into the skin 2 (two) times daily.    LEVOTHYROXINE (SYNTHROID, LEVOTHROID) 112 MCG TABLET    Take 112  mcg by mouth daily before breakfast.   LORATADINE (CLARITIN) 10 MG TABLET    Take 10 mg by mouth daily as needed. For allergies   LORAZEPAM (ATIVAN) 0.5 MG TABLET    Take 1 tablet every 8 hours as needed   LOSARTAN (COZAAR) 25 MG TABLET    Take 25 mg by mouth 2 (two) times daily.   METAXALONE (SKELAXIN) 800 MG TABLET    Take 800 mg by mouth 3 (three) times daily as needed.    METOPROLOL TARTRATE (LOPRESSOR) 25 MG TABLET    Take 25 mg by mouth 2 (two) times daily.   MIRABEGRON ER (MYRBETRIQ) 50 MG TB24 TABLET    Take 50 mg by mouth daily.   NITROGLYCERIN (NITROSTAT) 0.4 MG SL TABLET    Place 0.4 mg under the tongue every 5 (five) minutes as needed. For chest pain    OXYCODONE (OXY IR/ROXICODONE) 5 MG IMMEDIATE RELEASE TABLET  Take two tablets by mouth every four hours as needed for pain   POLYETHYLENE GLYCOL (MIRALAX / GLYCOLAX) PACKET    Take 17 g by mouth daily. Mix in 8 ounces of water; for constipation    SENNA (SENOKOT) 8.6 MG TABS    Take 2 tablets by mouth daily.     SODIUM CHLORIDE (OCEAN) 0.65 % NASAL SPRAY    Place 1 spray into the nose 2 (two) times daily as needed for congestion.   ZOLPIDEM (AMBIEN) 5 MG TABLET    1/2 by mouth at bedtime as needed for Insomnia  Modified Medications   No medications on file  Discontinued Medications   INSULIN ASPART (NOVOLOG) 100 UNIT/ML INJECTION    Inject 5 Units into the skin 3 (three) times daily before meals. Give if patient eats >50% of meals     Physical Exam:  Filed Vitals:   03/10/13 1424  BP: 102/69  Pulse: 78  Temp: 98.6 F (37 C)  Resp: 20    GENERAL APPEARANCE: Alert, conversant. Appropriately groomed. No acute distress. SKIN: No diaphoresis rash, or wounds  HEENT: unremarkable  NECK: No thyroid tenderness, enlargement or nodule  RESPIRATORY: Breathing is even, unlabored. Lung sounds are clear  CARDIOVASCULAR: Heart RRR no murmurs, rubs or gallops. No peripheral edema.  ARTERIAL: radial pulse 2+  GASTROINTESTINAL: Abdomen  is soft, non-tender, not distended w/ normal bowel sounds.  GENITOURINARY: Bladder non tender, not distended  MUSCULOSKELETAL: right AKA NEUROLOGIC: Oriented X3. Cranial nerves 2-12 grossly intact. Moves LLE; sensation intact to touch; no tremor.  PSYCHIATRIC: Mood and affect appropriate to situation, no behavioral    Labs reviewed: Lipid Profile  Result: 11/20/2012 4:00 PM ( Status: F )  Cholesterol 250 H 0-200 mg/dL SLN C  Triglyceride 811 H <150 mg/dL SLN  HDL Cholesterol 35 L >39 mg/dL SLN  Total Chol/HDL Ratio 7.1 Ratio SLN  VLDL Cholesterol (Calc) 44 H 0-40 mg/dL SLN  LDL Cholesterol (Calc) 171 H  BMP with Estimated GFR  Result: 12/31/2012 12:07 PM ( Status: F )  Sodium 141 135-145 mEq/L SLN  Potassium 3.8 3.5-5.3 mEq/L SLN  Chloride 103 96-112 mEq/L SLN  CO2 26 19-32 mEq/L SLN  Glucose 173 H 70-99 mg/dL SLN  BUN 14 9-14 mg/dL SLN  Creatinine 7.82 H 0.50-1.10 mg/dL SLN  Calcium 8.1 L 9.5-62.1 mg/dL SLN  Est GFR, African American 44 L mL/min SLN  Est GFR, NonAfrican American 38 L mL/min SLN C  CBC NO Diff (Complete Blood Count)  Result: 12/31/2012 1:07 PM ( Status: F )  WBC 6.7 4.0-10.5 K/uL SLN  RBC 3.85 L 3.87-5.11 MIL/uL SLN  Hemoglobin 10.4 L 12.0-15.0 g/dL SLN  Hematocrit 30.8 L 36.0-46.0 % SLN  MCV 83.6 78.0-100.0 fL SLN  MCH 27.0 26.0-34.0 pg SLN  MCHC 32.3 30.0-36.0 g/dL SLN  RDW 65.7 84.6-96.2 % SLN  Platelet Count 284 150-400 K/uL SLN  Lipid Profile  Result: 12/31/2012 12:07 PM ( Status: F )  Cholesterol 206 H 0-200 mg/dL SLN C  Triglyceride 952 H <150 mg/dL SLN  HDL Cholesterol 36 L >39 mg/dL SLN  Total Chol/HDL Ratio 5.7 Ratio SLN  VLDL Cholesterol (Calc) 33 8-41 mg/dL SLN  LDL Cholesterol (Calc) 137 H 0-99 mg/dL SLN C  Hemoglobin L2G  Result: 12/31/2012 4:29 PM ( Status: F )  Hemoglobin A1C 8.6 H <5.7 % SLN C  Estimated Average Glucose 200 H <117 mg/dL SLN  Lipid Profile  Result: 01/13/2013 3:06 PM ( Status: F )  Cholesterol  204 H 0-200 mg/dL SLN C   Triglyceride 562 <150 mg/dL SLN  HDL Cholesterol 35 L >39 mg/dL SLN  Total Chol/HDL Ratio 5.8 Ratio SLN  VLDL Cholesterol (Calc) 23 1-30 mg/dL SLN  LDL Cholesterol (Calc) 146 H 0-99   Assessment/Plan 1. CHF (congestive heart failure) Stable; no signs of worsening heart failure  2. CVA (cerebral vascular accident) Stable; conts on ASA, gemfibrozil, and lipitor  3. HYPERTENSION Stable on current medications  4. Diabetes mellitus with neurological manifestations, uncontrolled Blood sugars have improved with last months adjustment; will cont to monitor; will need follow up A1c next month.   5. Neuropathy Stable on current medications   6. Other and unspecified hyperlipidemia Will cont gemfibrozil and Lipitor will need follow up lipids next month

## 2013-03-16 ENCOUNTER — Other Ambulatory Visit: Payer: Self-pay | Admitting: *Deleted

## 2013-03-16 MED ORDER — FENTANYL 50 MCG/HR TD PT72: MEDICATED_PATCH | TRANSDERMAL | Status: DC

## 2013-03-17 ENCOUNTER — Other Ambulatory Visit: Payer: Self-pay | Admitting: *Deleted

## 2013-03-17 MED ORDER — LORAZEPAM 0.5 MG PO TABS: ORAL_TABLET | ORAL | Status: DC

## 2013-03-22 ENCOUNTER — Encounter: Payer: Self-pay | Admitting: Nurse Practitioner

## 2013-04-07 ENCOUNTER — Non-Acute Institutional Stay (SKILLED_NURSING_FACILITY): Payer: Medicare Other | Admitting: Nurse Practitioner

## 2013-04-07 DIAGNOSIS — I509 Heart failure, unspecified: Secondary | ICD-10-CM

## 2013-04-07 DIAGNOSIS — E039 Hypothyroidism, unspecified: Secondary | ICD-10-CM | POA: Insufficient documentation

## 2013-04-07 DIAGNOSIS — I6350 Cerebral infarction due to unspecified occlusion or stenosis of unspecified cerebral artery: Secondary | ICD-10-CM

## 2013-04-07 DIAGNOSIS — R454 Irritability and anger: Secondary | ICD-10-CM

## 2013-04-07 DIAGNOSIS — E1149 Type 2 diabetes mellitus with other diabetic neurological complication: Secondary | ICD-10-CM

## 2013-04-07 DIAGNOSIS — I639 Cerebral infarction, unspecified: Secondary | ICD-10-CM

## 2013-04-07 DIAGNOSIS — I10 Essential (primary) hypertension: Secondary | ICD-10-CM

## 2013-04-07 DIAGNOSIS — G629 Polyneuropathy, unspecified: Secondary | ICD-10-CM

## 2013-04-07 DIAGNOSIS — E1142 Type 2 diabetes mellitus with diabetic polyneuropathy: Secondary | ICD-10-CM

## 2013-04-07 DIAGNOSIS — G589 Mononeuropathy, unspecified: Secondary | ICD-10-CM

## 2013-04-07 DIAGNOSIS — E785 Hyperlipidemia, unspecified: Secondary | ICD-10-CM

## 2013-04-07 NOTE — Progress Notes (Signed)
Patient ID: Allison Washington, female   DOB: Sep 11, 1945, 67 y.o.   MRN: [id number]    Nursing Home Location:  [facility]   Place of Service: SNF (31)  PCP: Kimber Relic, MD  No Known Allergies  Chief Complaint  Patient presents with  . Medical Managment of Chronic Issues    HPI:  67 year old female with multiple medication conditions who is a long term resident of [place]. Pt without complains and staff report pt frequently refuses insulin (lantus and novolog) Has been more irritable lately  HYPOTHYROIDISM The hypothyroidism is stable.No complications noted from the medication presently being used. is taking synthroid 112 mcg daily.  DM, COMP CIRCULATION TYPE II UNCONTROLLED The diabetes is worse due to pt not taking insulin. Pt noncompliant with diet. No complications noted from the medication presently being used but she does not take it HYPERLIPIDEMIA .No complications noted from the medication presently being used.takes Lipitor 60 mg nightly, gemfibrozil 600 mg BID and fish oil 1 gm PO BID - cholesterol remains elevated; LDL still not a goal- pt noncompliant with diet  ANXIETY The anxiety remains stable.No complications noted from the medication presently being used. ativan 0.5 mg every 8 hours as needed  PAIN, CHRONIC  takes duragesic patch every 72 hours; takes neurontin 600 mg tid;oxycodone 10 mg every 4 hour as needed.  HYPERTENSION, BENIGN The blood pressure readings since the last visit have been in the target range. No complications noted from the medication presently being used.takes losartan 25mg  BID, clonidine 0.2 mg patch takes lopressor 25 mg twice daily  CONGESTIVE HEART FAILURE The congestive heart failure is stable. The patient's relates no significant weight change and denies worsening dyspnea on exertion, orthopnea, paroxysmal nocturnal dyspnea, pedal edema, palpitations, or chest pain.taking lasix 40mg  daily  PVD The patient's claudication  remains stable.No complications noted from the medication presently being used. takes asa 81 mg 4 tablets daily, reports no symptoms of claudication  CONSTIPATION The symptoms are stable.The medication is well tolerated.takes senna s 2 tabs daily and miralax daily  INSOMNIA, UNSPECIFIED stable is taking ambien 2.5 mg nightly as needed  INCONTINENCE, URINARY The urinary incontinence is better .No complications noted from the medication presently being used. takes myrbetriq 50 mg daily which has improved incontinence symptoms   Review of Systems:  Review of Systems  Constitutional: Negative for fever, chills and malaise/fatigue.  HENT: Negative.   Respiratory: Negative for cough and shortness of breath.   Cardiovascular: Negative for chest pain, palpitations and leg swelling.  Gastrointestinal: Negative for abdominal pain, diarrhea and constipation.  Genitourinary: Negative for dysuria, urgency and frequency.  Musculoskeletal: Negative for myalgias.  Skin: Negative.   Neurological: Negative for tingling, sensory change and weakness.  Psychiatric/Behavioral: Negative for depression. The patient is not nervous/anxious and does not have insomnia.      Past Medical History  Diagnosis Date  . 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 Diabetes mellitus     Prone to hypoglycemia  . Aphagia     Status post numerous  . Dysphagia   . Peripheral vascular disease, unspecified   . Arterial disease   . Depressive disorder   . Hypothyroid   . Seizures   . MRSA infection     History of MRSA in the left leg wound and sacrum in the past-has  been seen by vascular surgery and declined any type of intervention including arteriogram  . Diverticulosis     GI bleed in 2007-s/p endoscopy and colonoscopy in 2007 s/p massive GIB consittent with Diveerticulosis  . DVT (deep  venous thrombosis)     Not on Coumadin  . Anemia     Chronic  . Non-smoker   . Stroke   . Hypertension    Past Surgical History  Procedure Laterality Date  . Above knee leg amputation    . Femoral-popliteal bypass graft     Social History:   reports that she has never smoked. She does not have any smokeless tobacco history on file. She reports that she does not drink alcohol or use illicit drugs.  No family history on file.  Medications: Patient's Medications  New Prescriptions   No medications on file  Previous Medications   ACETAMINOPHEN (TYLENOL) 325 MG TABLET    Take 650 mg by mouth every 6 (six) hours as needed for pain.   ASPIRIN 81 MG TABLET    Take 81 mg by mouth daily.   ATORVASTATIN (LIPITOR) 40 MG TABLET    Take 60 mg by mouth at bedtime.    CLONIDINE (CATAPRES - DOSED IN MG/24 HR) 0.2 MG/24HR PATCH    Place 1 patch onto the skin once a week.   FENTANYL (DURAGESIC - DOSED MCG/HR) 50 MCG/HR    Apply one patch topically every 72 hours *Rotate Sites* Nurses must check placement every shift and notify (DON) if missing   FISH OIL-OMEGA-3 FATTY ACIDS 1000 MG CAPSULE    Take 1 g by mouth daily.   FUROSEMIDE (LASIX) 20 MG TABLET    Take 40 mg by mouth daily.   GABAPENTIN (NEURONTIN) 300 MG CAPSULE    Take 300 mg by mouth 3 (three) times daily.     GEMFIBROZIL (LOPID) 600 MG TABLET    Take 600 mg by mouth 2 (two) times daily before a meal.   INSULIN ASPART (NOVOLOG) 100 UNIT/ML INJECTION    Inject 5-15 Units into the skin 3 (three) times daily before meals. Give 10 units units with breakfast and lunch and an additional 5 units if cbg is greater than or equal to 150 Give 10 units for cbg 150-300 15 units for cbg >300   INSULIN GLARGINE (LANTUS) 100 UNIT/ML INJECTION    Inject 33 Units into the skin 2 (two) times daily.    LEVOTHYROXINE (SYNTHROID, LEVOTHROID) 112 MCG TABLET    Take 112 mcg by mouth daily before breakfast.   LORAZEPAM (ATIVAN) 0.5 MG TABLET    Take one tablet by  mouth every 8 hours as needed   LOSARTAN (COZAAR) 25 MG TABLET    Take 25 mg by mouth 2 (two) times daily.   METOPROLOL TARTRATE (LOPRESSOR) 25 MG TABLET    Take 25 mg by mouth 2 (two) times daily.   MIRABEGRON ER (MYRBETRIQ) 50 MG TB24 TABLET    Take 50 mg by mouth daily.   NITROGLYCERIN (NITROSTAT) 0.4 MG SL TABLET    Place 0.4 mg under the tongue every 5 (five) minutes as needed. For chest pain    OXYCODONE (OXY IR/ROXICODONE) 5 MG IMMEDIATE RELEASE TABLET    Take two tablets by mouth every four hours as needed for pain   POLYETHYLENE GLYCOL (MIRALAX / GLYCOLAX) PACKET    Take 17 g by mouth daily. Mix in 8 ounces of water; for constipation    SENNA (SENOKOT) 8.6 MG TABS  Take 2 tablets by mouth daily.     SODIUM CHLORIDE (OCEAN) 0.65 % NASAL SPRAY    Place 1 spray into the nose 2 (two) times daily as needed for congestion.   ZOLPIDEM (AMBIEN) 5 MG TABLET    1/2 by mouth at bedtime as needed for Insomnia  Modified Medications   No medications on file  Discontinued Medications   LORATADINE (CLARITIN) 10 MG TABLET    Take 10 mg by mouth daily as needed. For allergies   METAXALONE (SKELAXIN) 800 MG TABLET    Take 800 mg by mouth 3 (three) times daily as needed.      Physical Exam: Physical Exam  Nursing note and vitals reviewed. Constitutional: She is oriented to person, place, and time. She appears well-developed and well-nourished. No distress.  HENT:  Head: Normocephalic and atraumatic.  Eyes: EOM are normal. Pupils are equal, round, and reactive to light.  Neck: Normal range of motion. Neck supple. No thyromegaly present.  Cardiovascular: Normal rate, regular rhythm and normal heart sounds.   Pulmonary/Chest: Effort normal and breath sounds normal.  Abdominal: Soft. Bowel sounds are normal.  Musculoskeletal: Normal range of motion. Edema: trace   Neurological: She is alert and oriented to person, place, and time.  Skin: Skin is warm and dry. She is not diaphoretic.  Psychiatric:  She has a normal mood and affect.     Filed Vitals:   04/07/13 1115  BP: 120/73  Pulse: 75  Temp: 98.1 F (36.7 C)  Resp: 20      Labs reviewed: Lipid Profile  Result: 11/20/2012 4:00 PM ( Status: F )  Cholesterol 250 H 0-200 mg/dL SLN C  Triglyceride 161 H <150 mg/dL SLN  HDL Cholesterol 35 L >39 mg/dL SLN  Total Chol/HDL Ratio 7.1 Ratio SLN  VLDL Cholesterol (Calc) 44 H 0-40 mg/dL SLN  LDL Cholesterol (Calc) 171 H  BMP with Estimated GFR  Result: 12/31/2012 12:07 PM ( Status: F )  Sodium 141 135-145 mEq/L SLN  Potassium 3.8 3.5-5.3 mEq/L SLN  Chloride 103 96-112 mEq/L SLN  CO2 26 19-32 mEq/L SLN  Glucose 173 H 70-99 mg/dL SLN  BUN 14 0-96 mg/dL SLN  Creatinine 0.45 H 0.50-1.10 mg/dL SLN  Calcium 8.1 L 4.0-98.1 mg/dL SLN  Est GFR, African American 44 L mL/min SLN  Est GFR, NonAfrican American 38 L mL/min SLN C  CBC NO Diff (Complete Blood Count)  Result: 12/31/2012 1:07 PM ( Status: F )  WBC 6.7 4.0-10.5 K/uL SLN  RBC 3.85 L 3.87-5.11 MIL/uL SLN  Hemoglobin 10.4 L 12.0-15.0 g/dL SLN  Hematocrit 19.1 L 36.0-46.0 % SLN  MCV 83.6 78.0-100.0 fL SLN  MCH 27.0 26.0-34.0 pg SLN  MCHC 32.3 30.0-36.0 g/dL SLN  RDW 47.8 29.5-62.1 % SLN  Platelet Count 284 150-400 K/uL SLN  Lipid Profile  Result: 12/31/2012 12:07 PM ( Status: F )  Cholesterol 206 H 0-200 mg/dL SLN C  Triglyceride 308 H <150 mg/dL SLN  HDL Cholesterol 36 L >39 mg/dL SLN  Total Chol/HDL Ratio 5.7 Ratio SLN  VLDL Cholesterol (Calc) 33 6-57 mg/dL SLN  LDL Cholesterol (Calc) 137 H 0-99 mg/dL SLN C  Hemoglobin Q4O  Result: 12/31/2012 4:29 PM ( Status: F )  Hemoglobin A1C 8.6 H <5.7 % SLN C  Estimated Average Glucose 200 H <117 mg/dL SLN  Lipid Profile  Result: 01/13/2013 3:06 PM ( Status: F )  Cholesterol 204 H 0-200 mg/dL SLN C  Triglyceride 962 <150 mg/dL SLN  HDL  Cholesterol 35 L >39 mg/dL SLN  Total Chol/HDL Ratio 5.8 Ratio SLN  VLDL Cholesterol (Calc) 23 1-61 mg/dL SLN  LDL Cholesterol (Calc) 146 H 0-99     Assessment/Plan 1. Neuropathy -improved with increase in gabapentin   2. CHF (congestive heart failure) -stable; conts on lasix  3. HYPERTENSION -Patient is stable; continue current regimen. Will monitor and make changes as necessary.  4. CVA (cerebral vascular accident) -with aphagia; conts on asa, Lipitor, lipod  5. Diabetes mellitus with neurological manifestations, uncontrolled -tried to educate pt on why she needs proper glycemic control and the effects of high blood sugars, pt reports "I do not want my body to get used to insulin" "I do not need that much insulin" after several attempts to educate pt she cuts me off and states I am not listening to her and she will take what she wants. Pt reports she understands education; I question this, will have nursing staff do ongoing education  -educated to change diet if she does not wish to have additional novolog and lantus; she is non-complaint with diet and snacks frequently; healthier choices will help blood sugars -will add tradjenta 5 mg daily -will change lantus to 66 units once daily to reduce shots -follow up cmp and cbc  6. Unspecified hypothyroidism -will follow up TSH  7. Irritability -will follow up labs, will also have nursing supervisors speak to pt on why she is declining medication -will get psych consult at this time   8. hyperlipidemia -will dc Lipitor -start Crestor 40 mg qhs for cholesterol

## 2013-04-16 ENCOUNTER — Other Ambulatory Visit: Payer: Self-pay | Admitting: *Deleted

## 2013-04-16 MED ORDER — FENTANYL 50 MCG/HR TD PT72: MEDICATED_PATCH | TRANSDERMAL | Status: DC

## 2013-05-01 ENCOUNTER — Encounter: Payer: Self-pay | Admitting: Nurse Practitioner

## 2013-05-01 ENCOUNTER — Non-Acute Institutional Stay (SKILLED_NURSING_FACILITY): Payer: Medicare Other | Admitting: Nurse Practitioner

## 2013-05-01 DIAGNOSIS — E039 Hypothyroidism, unspecified: Secondary | ICD-10-CM

## 2013-05-01 DIAGNOSIS — IMO0002 Reserved for concepts with insufficient information to code with codable children: Secondary | ICD-10-CM

## 2013-05-01 DIAGNOSIS — I509 Heart failure, unspecified: Secondary | ICD-10-CM

## 2013-05-01 DIAGNOSIS — I10 Essential (primary) hypertension: Secondary | ICD-10-CM

## 2013-05-01 DIAGNOSIS — G629 Polyneuropathy, unspecified: Secondary | ICD-10-CM

## 2013-05-01 DIAGNOSIS — E1149 Type 2 diabetes mellitus with other diabetic neurological complication: Secondary | ICD-10-CM

## 2013-05-01 DIAGNOSIS — G589 Mononeuropathy, unspecified: Secondary | ICD-10-CM

## 2013-05-01 DIAGNOSIS — E1165 Type 2 diabetes mellitus with hyperglycemia: Principal | ICD-10-CM

## 2013-05-01 NOTE — Progress Notes (Signed)
Patient ID: Allison Washington, female   DOB: 1945/10/25, 68 y.o.   MRN: [id number]    Nursing Home Location:  [facility]   Place of Service: SNF (31)  PCP: Kimber Relic, MD  No Known Allergies  Chief Complaint  Patient presents with  . Medical Managment of Chronic Issues    HPI:  68 year old female with multiple medication conditions who is a long term resident of [place]. Pt without complains and staff report pt frequently refuses insulin (lantus and novolog) which has improved since last month but she will still chose when she wants her mediation; otherwise pt without complaints, staff without new concerns, pt non-compliant with diet and medications   Review of Systems:  Review of Systems  Constitutional: Negative for fever, chills and malaise/fatigue.  HENT: Negative.   Respiratory: Negative for cough and shortness of breath.   Cardiovascular: Negative for chest pain, palpitations and leg swelling.  Gastrointestinal: Negative for abdominal pain, diarrhea and constipation.  Genitourinary: Negative for dysuria, urgency and frequency.  Musculoskeletal: Negative for myalgias.  Skin: Negative.   Neurological: Negative for tingling, sensory change and weakness.  Psychiatric/Behavioral: Negative for depression. The patient is not nervous/anxious and does not have insomnia.      Past Medical History  Diagnosis Date  . 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 Diabetes mellitus     Prone to hypoglycemia  . Aphagia     Status post numerous  . Dysphagia   . Peripheral vascular disease, unspecified   . Arterial disease   . Depressive disorder   . Hypothyroid   . Seizures   . MRSA infection     History of MRSA in the left leg wound and sacrum in the past-has been seen by vascular surgery and declined any type of intervention  including arteriogram  . Diverticulosis     GI bleed in 2007-s/p endoscopy and colonoscopy in 2007 s/p massive GIB consittent with Diveerticulosis  . DVT (deep venous thrombosis)     Not on Coumadin  . Anemia     Chronic  . Non-smoker   . Stroke   . Hypertension    Past Surgical History  Procedure Laterality Date  . Above knee leg amputation    . Femoral-popliteal bypass graft     Social History:   reports that she has never smoked. She does not have any smokeless tobacco history on file. She reports that she does not drink alcohol or use illicit drugs.  No family history on file.  Medications: Patient's Medications  New Prescriptions   No medications on file  Previous Medications   ACETAMINOPHEN (TYLENOL) 325 MG TABLET    Take 650 mg by mouth every 6 (six) hours as needed for pain.   ASPIRIN 81 MG TABLET    Take 81 mg by mouth daily.   CLONIDINE (CATAPRES - DOSED IN MG/24 HR) 0.2 MG/24HR PATCH    Place 1 patch onto the skin once a week.   FENTANYL (DURAGESIC - DOSED MCG/HR) 50 MCG/HR    Apply one patch topically every 72 hours *Rotate Sites* Nurses must check placement every shift and notify (DON) if missing   FISH OIL-OMEGA-3 FATTY ACIDS 1000 MG CAPSULE    Take 1 g by mouth daily.   FUROSEMIDE (LASIX) 20 MG TABLET    Take 40 mg  by mouth daily.   GABAPENTIN (NEURONTIN) 300 MG CAPSULE    Take 300 mg by mouth 3 (three) times daily.     GEMFIBROZIL (LOPID) 600 MG TABLET    Take 600 mg by mouth 2 (two) times daily before a meal.   INSULIN ASPART (NOVOLOG) 100 UNIT/ML INJECTION    Inject 5-15 Units into the skin 3 (three) times daily before meals. Give 10 units units with breakfast and lunch and an additional 5 units if cbg is greater than or equal to 150 Give 10 units for cbg 150-300 15 units for cbg >300   INSULIN GLARGINE (LANTUS) 100 UNIT/ML INJECTION    Inject 66 Units into the skin daily.    LEVOTHYROXINE (SYNTHROID, LEVOTHROID) 112 MCG TABLET    Take 100 mcg by mouth daily  before breakfast.    LINAGLIPTIN (TRADJENTA) 5 MG TABS TABLET    Take 5 mg by mouth daily.   LORAZEPAM (ATIVAN) 0.5 MG TABLET    Take one tablet by mouth every 8 hours as needed   LOSARTAN (COZAAR) 25 MG TABLET    Take 25 mg by mouth 2 (two) times daily.   METOPROLOL TARTRATE (LOPRESSOR) 25 MG TABLET    Take 25 mg by mouth 2 (two) times daily.   MIRABEGRON ER (MYRBETRIQ) 50 MG TB24 TABLET    Take 50 mg by mouth daily.   NITROGLYCERIN (NITROSTAT) 0.4 MG SL TABLET    Place 0.4 mg under the tongue every 5 (five) minutes as needed. For chest pain    OXYCODONE (OXY IR/ROXICODONE) 5 MG IMMEDIATE RELEASE TABLET    Take two tablets by mouth every four hours as needed for pain   POLYETHYLENE GLYCOL (MIRALAX / GLYCOLAX) PACKET    Take 17 g by mouth daily. Mix in 8 ounces of water; for constipation    ROSUVASTATIN (CRESTOR) 40 MG TABLET    Take 40 mg by mouth daily.   SENNA (SENOKOT) 8.6 MG TABS    Take 2 tablets by mouth daily.     SODIUM CHLORIDE (OCEAN) 0.65 % NASAL SPRAY    Place 1 spray into the nose 2 (two) times daily as needed for congestion.   ZOLPIDEM (AMBIEN) 5 MG TABLET    1/2 by mouth at bedtime as needed for Insomnia  Modified Medications   No medications on file  Discontinued Medications   ATORVASTATIN (LIPITOR) 40 MG TABLET    Take 60 mg by mouth at bedtime.      Physical Exam:  Filed Vitals:   05/01/13 1438  BP: 144/71  Pulse: 62  Temp: 97.1 F (36.2 C)  Resp: 20   Physical Exam  Constitutional: She is well-developed, well-nourished, and in no distress. No distress.  HENT:  Head: Normocephalic and atraumatic.  Mouth/Throat: Oropharynx is clear and moist. No oropharyngeal exudate.  Eyes: Conjunctivae and EOM are normal. Pupils are equal, round, and reactive to light.  Neck: Normal range of motion. Neck supple.  Cardiovascular: Normal rate and regular rhythm.   Pulmonary/Chest: Effort normal and breath sounds normal. No respiratory distress.  Abdominal: Soft. Bowel sounds  are normal.  Musculoskeletal: She exhibits no edema and no tenderness.  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  Skin: Skin is warm and dry. She is not diaphoretic.  Heel wound; w/o signs of infection      Labs reviewed: Lipid Profile  Result: 11/20/2012 4:00 PM ( Status: F )  Cholesterol 250 H  0-200 mg/dL SLN C  Triglyceride 161 H <150 mg/dL SLN  HDL Cholesterol 35 L >39 mg/dL SLN  Total Chol/HDL Ratio 7.1 Ratio SLN  VLDL Cholesterol (Calc) 44 H 0-40 mg/dL SLN  LDL Cholesterol (Calc) 171 H  BMP with Estimated GFR  Result: 12/31/2012 12:07 PM ( Status: F )  Sodium 141 135-145 mEq/L SLN  Potassium 3.8 3.5-5.3 mEq/L SLN  Chloride 103 96-112 mEq/L SLN  CO2 26 19-32 mEq/L SLN  Glucose 173 H 70-99 mg/dL SLN  BUN 14 0-96 mg/dL SLN  Creatinine 0.45 H 0.50-1.10 mg/dL SLN  Calcium 8.1 L 4.0-98.1 mg/dL SLN  Est GFR, African American 44 L mL/min SLN  Est GFR, NonAfrican American 38 L mL/min SLN C  CBC NO Diff (Complete Blood Count)  Result: 12/31/2012 1:07 PM ( Status: F )  WBC 6.7 4.0-10.5 K/uL SLN  RBC 3.85 L 3.87-5.11 MIL/uL SLN  Hemoglobin 10.4 L 12.0-15.0 g/dL SLN  Hematocrit 19.1 L 36.0-46.0 % SLN  MCV 83.6 78.0-100.0 fL SLN  MCH 27.0 26.0-34.0 pg SLN  MCHC 32.3 30.0-36.0 g/dL SLN  RDW 47.8 29.5-62.1 % SLN  Platelet Count 284 150-400 K/uL SLN  Lipid Profile  Result: 12/31/2012 12:07 PM ( Status: F )  Cholesterol 206 H 0-200 mg/dL SLN C  Triglyceride 308 H <150 mg/dL SLN  HDL Cholesterol 36 L >39 mg/dL SLN  Total Chol/HDL Ratio 5.7 Ratio SLN  VLDL Cholesterol (Calc) 33 6-57 mg/dL SLN  LDL Cholesterol (Calc) 137 H 0-99 mg/dL SLN C  Hemoglobin Q4O  Result: 12/31/2012 4:29 PM ( Status: F )  Hemoglobin A1C 8.6 H <5.7 % SLN C  Estimated Average Glucose 200 H <117 mg/dL SLN  Lipid Profile  Result: 01/13/2013 3:06 PM ( Status: F )  Cholesterol 204 H 0-200 mg/dL SLN C  Triglyceride 962 <150 mg/dL SLN  HDL Cholesterol 35 L  >39 mg/dL SLN  Total Chol/HDL Ratio 5.8 Ratio SLN  VLDL Cholesterol (Calc) 23 9-52 mg/dL SLN  LDL Cholesterol (Calc) 146 H 0-99  CBC NO Diff (Complete Blood Count)    Result: 04/08/2013 12:02 PM   ( Status: F )       WBC 7.3     4.0-10.5 K/uL SLN   RBC 3.96     3.87-5.11 MIL/uL SLN   Hemoglobin 10.9   L 12.0-15.0 g/dL SLN   Hematocrit 84.1   L 36.0-46.0 % SLN   MCV 82.8     78.0-100.0 fL SLN   MCH 27.5     26.0-34.0 pg SLN   MCHC 33.2     30.0-36.0 g/dL SLN   RDW 32.4     40.1-02.7 % SLN   Platelet Count 261     150-400 K/uL SLN   Comprehensive Metabolic Panel    Result: 04/08/2013 1:41 PM   ( Status: F )       Sodium 139     135-145 mEq/L SLN   Potassium 3.6     3.5-5.3 mEq/L SLN   Chloride 104     96-112 mEq/L SLN   CO2 28     19-32 mEq/L SLN   Glucose 192   H 70-99 mg/dL SLN   BUN 13     2-53 mg/dL SLN   Creatinine 6.64   H 0.50-1.10 mg/dL SLN   Bilirubin, Total 0.2   L 0.3-1.2 mg/dL SLN   Alkaline Phosphatase 116     39-117 U/L SLN   AST/SGOT 12     0-37 U/L SLN  ALT/SGPT <8     0-35 U/L SLN   Total Protein 6.1     6.0-8.3 g/dL SLN   Albumin 2.9   L 4.0-9.83.5-5.2 g/dL SLN   Calcium 8.5     1.1-91.48.4-10.5 mg/dL SLN   TSH, Ultrasensitive    Result: 04/08/2013 2:17 PM   ( Status: F )       TSH 0.070   L 0.350-4.500 uIU/mL SLN    Assessment/Plan 1. Diabetes mellitus with neurological manifestations, uncontrolled -pt now letting staff give insulin more regularly, encouraged med and diet compliance - blood sugars still elevated but pt has not been on tradjenta regularly; will cont to follow sugars  2. Unspecified hypothyroidism -dose has been reduced due to low TSH -follow up TSH scheduled  3. HYPERTENSION -Patient is stable; continue current regimen. Will monitor and make changes as necessary.  4. CHF (congestive heart failure) -stable at this time  5. Neuropathy -without complaints at this time  7. Heel wound -followed by wound care MD being treated with  Skin prep at  this time  8. hyperlipemia -medication changed to crestor last month for better control -will have staff follow up lipids

## 2013-05-25 ENCOUNTER — Other Ambulatory Visit: Payer: Self-pay | Admitting: *Deleted

## 2013-05-25 MED ORDER — FENTANYL 50 MCG/HR TD PT72: MEDICATED_PATCH | TRANSDERMAL | Status: DC

## 2013-05-29 ENCOUNTER — Other Ambulatory Visit: Payer: Self-pay | Admitting: *Deleted

## 2013-05-29 MED ORDER — OXYCODONE HCL 5 MG PO TABS: ORAL_TABLET | ORAL | Status: DC

## 2013-05-29 NOTE — Telephone Encounter (Signed)
[facility] 

## 2013-06-05 ENCOUNTER — Non-Acute Institutional Stay (SKILLED_NURSING_FACILITY): Payer: Medicare Other | Admitting: Nurse Practitioner

## 2013-06-05 DIAGNOSIS — I509 Heart failure, unspecified: Secondary | ICD-10-CM

## 2013-06-05 DIAGNOSIS — E039 Hypothyroidism, unspecified: Secondary | ICD-10-CM

## 2013-06-05 DIAGNOSIS — E1165 Type 2 diabetes mellitus with hyperglycemia: Secondary | ICD-10-CM

## 2013-06-05 DIAGNOSIS — E1149 Type 2 diabetes mellitus with other diabetic neurological complication: Secondary | ICD-10-CM

## 2013-06-05 DIAGNOSIS — G589 Mononeuropathy, unspecified: Secondary | ICD-10-CM

## 2013-06-05 DIAGNOSIS — E785 Hyperlipidemia, unspecified: Secondary | ICD-10-CM

## 2013-06-05 DIAGNOSIS — IMO0002 Reserved for concepts with insufficient information to code with codable children: Secondary | ICD-10-CM

## 2013-06-05 DIAGNOSIS — G629 Polyneuropathy, unspecified: Secondary | ICD-10-CM

## 2013-06-05 DIAGNOSIS — I10 Essential (primary) hypertension: Secondary | ICD-10-CM

## 2013-06-05 NOTE — Progress Notes (Signed)
Patient ID: Allison Washington, female   DOB: 02-Oct-1945, 68 y.o.   MRN: [id number]    Nursing Home Location:  [facility]   Place of Service: SNF (31)  PCP: Kimber Relic, MD  No Known Allergies  Chief Complaint  Patient presents with  . Medical Managment of Chronic Issues    HPI:  68 year old female with multiple medication conditions who is a long term resident of [place]. Pt with a pmh of hyperlipidemia, uncontrolled diabetes, CVA, insomnia, depression, hypothyroid, CHF who is being seen today to follow up on chronic conditions. Pt was previously not taking her insulin now she is more complaint, reports she is more compliant with diet. In the last month pt reports she has done well without any changes, nursing does not have concerns that this time.  Does cont to have a heel ulcer followed by wound care Review of Systems:  Review of Systems  Constitutional: Negative for fever, chills and malaise/fatigue.  HENT: Negative.   Respiratory: Negative for cough and shortness of breath.   Cardiovascular: Negative for chest pain, palpitations and leg swelling.  Gastrointestinal: Negative for abdominal pain, diarrhea and constipation.  Genitourinary: Negative for dysuria, urgency and frequency.  Musculoskeletal: Negative for myalgias.  Skin: Negative.   Neurological: Negative for tingling, sensory change and weakness.  Psychiatric/Behavioral: Negative for depression. The patient is not nervous/anxious and does not have insomnia.      Past Medical History  Diagnosis Date  . 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 Diabetes mellitus     Prone to hypoglycemia  . Aphagia     Status post numerous  . Dysphagia   . Peripheral vascular disease, unspecified   . Arterial disease   . Depressive disorder   . Hypothyroid   . Seizures   .  MRSA infection     History of MRSA in the left leg wound and sacrum in the past-has been seen by vascular surgery and declined any type of intervention including arteriogram  . Diverticulosis     GI bleed in 2007-s/p endoscopy and colonoscopy in 2007 s/p massive GIB consittent with Diveerticulosis  . DVT (deep venous thrombosis)     Not on Coumadin  . Anemia     Chronic  . Non-smoker   . Stroke   . Hypertension    Past Surgical History  Procedure Laterality Date  . Above knee leg amputation    . Femoral-popliteal bypass graft     Social History:   reports that she has never smoked. She does not have any smokeless tobacco history on file. She reports that she does not drink alcohol or use illicit drugs.  No family history on file.  Medications: Patient's Medications  New Prescriptions   No medications on file  Previous Medications   ACETAMINOPHEN (TYLENOL) 325 MG TABLET    Take 650 mg by mouth every 6 (six) hours as needed for pain.   ASPIRIN 81 MG TABLET    Take 81 mg by mouth daily.   CLONIDINE (CATAPRES - DOSED IN MG/24 HR) 0.2 MG/24HR PATCH    Place 1 patch onto the skin once a week.   FENTANYL (DURAGESIC - DOSED MCG/HR) 50 MCG/HR    Apply one patch topically every 72 hours *Rotate Sites* Nurses must check placement every shift and notify (DON) if missing  FISH OIL-OMEGA-3 FATTY ACIDS 1000 MG CAPSULE    Take 1 g by mouth daily.   FUROSEMIDE (LASIX) 20 MG TABLET    Take 40 mg by mouth daily.   GABAPENTIN (NEURONTIN) 300 MG CAPSULE    Take 600 mg by mouth 3 (three) times daily.    GEMFIBROZIL (LOPID) 600 MG TABLET    Take 600 mg by mouth 2 (two) times daily before a meal.   INSULIN ASPART (NOVOLOG) 100 UNIT/ML INJECTION    Inject 7-10 Units into the skin 3 (three) times daily before meals. Give 10 units units with breakfast and lunch and 7 units for dinner   INSULIN GLARGINE (LANTUS) 100 UNIT/ML INJECTION    Inject 66 Units into the skin daily.    LEVOTHYROXINE (SYNTHROID,  LEVOTHROID) 100 MCG TABLET    Take 100 mcg by mouth daily before breakfast.   LINAGLIPTIN (TRADJENTA) 5 MG TABS TABLET    Take 5 mg by mouth daily.   LORAZEPAM (ATIVAN) 0.5 MG TABLET    Take one tablet by mouth every 8 hours as needed   LOSARTAN (COZAAR) 25 MG TABLET    Take 25 mg by mouth 2 (two) times daily.   METOPROLOL TARTRATE (LOPRESSOR) 25 MG TABLET    Take 25 mg by mouth 2 (two) times daily.   MIRABEGRON ER (MYRBETRIQ) 50 MG TB24 TABLET    Take 50 mg by mouth daily.   NITROGLYCERIN (NITROSTAT) 0.4 MG SL TABLET    Place 0.4 mg under the tongue every 5 (five) minutes as needed. For chest pain    OXYCODONE (OXY IR/ROXICODONE) 5 MG IMMEDIATE RELEASE TABLET    Take two tablets by mouth every four hours as needed for pain   POLYETHYLENE GLYCOL (MIRALAX / GLYCOLAX) PACKET    Take 17 g by mouth daily. Mix in 8 ounces of water; for constipation    ROSUVASTATIN (CRESTOR) 40 MG TABLET    Take 40 mg by mouth daily.   SENNA (SENOKOT) 8.6 MG TABS    Take 2 tablets by mouth daily.     SODIUM CHLORIDE (OCEAN) 0.65 % NASAL SPRAY    Place 1 spray into the nose 2 (two) times daily as needed for congestion.   ZOLPIDEM (AMBIEN) 5 MG TABLET    1/2 by mouth at bedtime as needed for Insomnia  Modified Medications   No medications on file  Discontinued Medications   LEVOTHYROXINE (SYNTHROID, LEVOTHROID) 112 MCG TABLET    Take 100 mcg by mouth daily before breakfast.      Physical Exam: Physical Exam  Constitutional: She is well-developed, well-nourished, and in no distress. No distress.  HENT:  Head: Normocephalic and atraumatic.  Mouth/Throat: Oropharynx is clear and moist. No oropharyngeal exudate.  Eyes: Conjunctivae and EOM are normal. Pupils are equal, round, and reactive to light.  Neck: Normal range of motion. Neck supple.  Cardiovascular: Normal rate and regular rhythm.   Pulmonary/Chest: Effort normal and breath sounds normal. No respiratory distress.  Abdominal: Soft. Bowel sounds are normal.    Musculoskeletal: She exhibits no edema and no tenderness.  Neurological: She is alert.  Skin: Skin is warm and dry. She is not diaphoretic.  Heel wound; w/o signs of infection     Filed Vitals:   06/05/13 1531  BP: 156/78  Pulse: 64  Temp: 98.4 F (36.9 C)  Resp: 18      Labs reviewed: Lipid Profile  Result: 11/20/2012 4:00 PM ( Status: F )  Cholesterol 250 H 0-200  mg/dL SLN C  Triglyceride 161219 H <150 mg/dL SLN  HDL Cholesterol 35 L >39 mg/dL SLN  Total Chol/HDL Ratio 7.1 Ratio SLN  VLDL Cholesterol (Calc) 44 H 0-40 mg/dL SLN  LDL Cholesterol (Calc) 171 H  BMP with Estimated GFR  Result: 12/31/2012 12:07 PM ( Status: F )  Sodium 141 135-145 mEq/L SLN  Potassium 3.8 3.5-5.3 mEq/L SLN  Chloride 103 96-112 mEq/L SLN  CO2 26 19-32 mEq/L SLN  Glucose 173 H 70-99 mg/dL SLN  BUN 14 0-966-23 mg/dL SLN  Creatinine 0.451.46 H 0.50-1.10 mg/dL SLN  Calcium 8.1 L 4.0-98.18.4-10.5 mg/dL SLN  Est GFR, African American 44 L mL/min SLN  Est GFR, NonAfrican American 38 L mL/min SLN C  CBC NO Diff (Complete Blood Count)  Result: 12/31/2012 1:07 PM ( Status: F )  WBC 6.7 4.0-10.5 K/uL SLN  RBC 3.85 L 3.87-5.11 MIL/uL SLN  Hemoglobin 10.4 L 12.0-15.0 g/dL SLN  Hematocrit 19.132.2 L 36.0-46.0 % SLN  MCV 83.6 78.0-100.0 fL SLN  MCH 27.0 26.0-34.0 pg SLN  MCHC 32.3 30.0-36.0 g/dL SLN  RDW 47.813.9 29.5-62.111.5-15.5 % SLN  Platelet Count 284 150-400 K/uL SLN  Lipid Profile  Result: 12/31/2012 12:07 PM ( Status: F )  Cholesterol 206 H 0-200 mg/dL SLN C  Triglyceride 308165 H <150 mg/dL SLN  HDL Cholesterol 36 L >39 mg/dL SLN  Total Chol/HDL Ratio 5.7 Ratio SLN  VLDL Cholesterol (Calc) 33 6-570-40 mg/dL SLN  LDL Cholesterol (Calc) 137 H 0-99 mg/dL SLN C  Hemoglobin Q4OA1C  Result: 12/31/2012 4:29 PM ( Status: F )  Hemoglobin A1C 8.6 H <5.7 % SLN C  Estimated Average Glucose 200 H <117 mg/dL SLN  Lipid Profile  Result: 01/13/2013 3:06 PM ( Status: F )  Cholesterol 204 H 0-200 mg/dL SLN C  Triglyceride 962117 <150 mg/dL SLN  HDL  Cholesterol 35 L >39 mg/dL SLN  Total Chol/HDL Ratio 5.8 Ratio SLN  VLDL Cholesterol (Calc) 23 9-520-40 mg/dL SLN  LDL Cholesterol (Calc) 146 H 0-99  CBC NO Diff (Complete Blood Count)  Result: 04/08/2013 12:02 PM ( Status: F )  WBC 7.3 4.0-10.5 K/uL SLN  RBC 3.96 3.87-5.11 MIL/uL SLN  Hemoglobin 10.9 L 12.0-15.0 g/dL SLN  Hematocrit 84.132.8 L 36.0-46.0 % SLN  MCV 82.8 78.0-100.0 fL SLN  MCH 27.5 26.0-34.0 pg SLN  MCHC 33.2 30.0-36.0 g/dL SLN  RDW 32.414.6 40.1-02.711.5-15.5 % SLN  Platelet Count 261 150-400 K/uL SLN  Comprehensive Metabolic Panel  Result: 04/08/2013 1:41 PM ( Status: F )  Sodium 139 135-145 mEq/L SLN  Potassium 3.6 3.5-5.3 mEq/L SLN  Chloride 104 96-112 mEq/L SLN  CO2 28 19-32 mEq/L SLN  Glucose 192 H 70-99 mg/dL SLN  BUN 13 2-536-23 mg/dL SLN  Creatinine 6.641.16 H 0.50-1.10 mg/dL SLN  Bilirubin, Total 0.2 L 0.3-1.2 mg/dL SLN  Alkaline Phosphatase 116 39-117 U/L SLN  AST/SGOT 12 0-37 U/L SLN  ALT/SGPT <8 0-35 U/L SLN  Total Protein 6.1 6.0-8.3 g/dL SLN  Albumin 2.9 L 4.0-3.43.5-5.2 g/dL SLN  Calcium 8.5 7.4-25.98.4-10.5 mg/dL SLN  TSH, Ultrasensitive  Result: 04/08/2013 2:17 PM ( Status: F )  TSH 0.070 L 0.350-4.500 uIU/mL SLN     Assessment/Plan 1. HYPERTENSION -Patients blood pressure is elevated- will start norvasc 10 mg daily for better BP control   2. CHF (congestive heart failure) -remains stable; conts lasix, ASA, betablocker   3. Unspecified hypothyroidism -synthroid was adjusted due to low TSH however follow up TSH was never drawn; will reorder at this time  4. Neuropathy -remains stable  5. Diabetes mellitus with neurological manifestations, uncontrolled -blood sugars have been better controlled; pt taking medications better at this time, reports better diet control -will follow up A1c  6. Other and unspecified hyperlipidemia -conts on crestor, gemfibrozil and fish oil -will follow up lipid panel   Labs/tests ordered Cmp. A1c, TSH, fasting lipids

## 2013-06-30 ENCOUNTER — Other Ambulatory Visit: Payer: Self-pay | Admitting: *Deleted

## 2013-06-30 MED ORDER — FENTANYL 50 MCG/HR TD PT72: MEDICATED_PATCH | TRANSDERMAL | Status: DC

## 2013-06-30 NOTE — Telephone Encounter (Signed)
[facility] 

## 2013-07-07 ENCOUNTER — Encounter: Payer: Self-pay | Admitting: Nurse Practitioner

## 2013-07-07 ENCOUNTER — Non-Acute Institutional Stay (SKILLED_NURSING_FACILITY): Payer: Medicare Other | Admitting: Nurse Practitioner

## 2013-07-07 DIAGNOSIS — E785 Hyperlipidemia, unspecified: Secondary | ICD-10-CM

## 2013-07-07 DIAGNOSIS — R569 Unspecified convulsions: Secondary | ICD-10-CM

## 2013-07-07 DIAGNOSIS — I509 Heart failure, unspecified: Secondary | ICD-10-CM

## 2013-07-07 DIAGNOSIS — E1149 Type 2 diabetes mellitus with other diabetic neurological complication: Secondary | ICD-10-CM

## 2013-07-07 DIAGNOSIS — I639 Cerebral infarction, unspecified: Secondary | ICD-10-CM

## 2013-07-07 DIAGNOSIS — N289 Disorder of kidney and ureter, unspecified: Secondary | ICD-10-CM

## 2013-07-07 DIAGNOSIS — G629 Polyneuropathy, unspecified: Secondary | ICD-10-CM

## 2013-07-07 DIAGNOSIS — G589 Mononeuropathy, unspecified: Secondary | ICD-10-CM

## 2013-07-07 DIAGNOSIS — E039 Hypothyroidism, unspecified: Secondary | ICD-10-CM

## 2013-07-07 DIAGNOSIS — I10 Essential (primary) hypertension: Secondary | ICD-10-CM

## 2013-07-07 DIAGNOSIS — E1165 Type 2 diabetes mellitus with hyperglycemia: Secondary | ICD-10-CM

## 2013-07-07 DIAGNOSIS — IMO0002 Reserved for concepts with insufficient information to code with codable children: Secondary | ICD-10-CM

## 2013-07-07 DIAGNOSIS — I6350 Cerebral infarction due to unspecified occlusion or stenosis of unspecified cerebral artery: Secondary | ICD-10-CM

## 2013-07-07 NOTE — Progress Notes (Signed)
Patient ID: Allison Washington, female   DOB: [birth year], 68 y.o.   MRN: [id number]   Nursing Home Location:  [facility]   Place of Service: SNF (31)  PCP: Kimber Relic, MD  No Known Allergies  Chief Complaint  Patient presents with  . Medical Managment of Chronic Issues    HPI:  This patient is a 68 year old female who has an extensive history of uncontrolled diabetes with complication, right AKA, hypertension, CHF, CVA, aphasia, hypothyroidism, neuropathy, and renal insufficiency. Patient is also incontinent at times, but is able to feed self and take meds independently. She performs transfers independently or with one person assist. She has pressure ulcers to her sacral area and left heel that are stable and non-infected. She has been refusing her insulin and SS coverage, even though FSBS remain elevated, with documented fasting FSBS 150-250 range. Patient reports "too much they try to give me". Counseled patient regarding compliance with insulin regimen.   Review of Systems:  Review of Systems  Constitutional: Negative for fever, chills and weight loss.  HENT: Negative for ear pain.   Eyes: Negative for blurred vision.  Respiratory: Negative for cough, sputum production, shortness of breath and wheezing.   Cardiovascular: Negative for chest pain, palpitations and leg swelling.  Gastrointestinal: Negative for heartburn, nausea, vomiting, abdominal pain, blood in stool and melena. Constipation: occasionally, not today.  Genitourinary: Negative for dysuria, urgency and frequency.  Musculoskeletal: Negative for falls, joint pain and myalgias.  Skin: Negative for rash.  Neurological: Negative for dizziness, tingling, seizures, loss of consciousness and headaches.  Psychiatric/Behavioral: Negative for memory loss. The patient is not nervous/anxious and does not have insomnia.      Past Medical History  Diagnosis Date  . Hemiplegia and hemiparesis     5x cva's known  history of bilateral  subcortical strokes and pseudobulbar, MRI with acute infarct of the left basal ganglia, posterior limb of  . Diabetes mellitus     Prone to hypoglycemia  . Aphagia     Status post numerous  . Dysphagia   . Peripheral vascular disease, unspecified   . Arterial disease   . Depressive disorder   . Hypothyroid   . Seizures   . MRSA infection     History of MRSA in the left leg wound and sacrum in the past-has been seen by vascular surgery and declined any type of intervention including arteriogram  . Diverticulosis     GI bleed in 2007-s/p endoscopy and colonoscopy in 2007 s/p massive GIB consittent with Diveerticulosis  . DVT (deep venous thrombosis)     Not on Coumadin  . Anemia     Chronic  . Non-smoker   . Stroke   . Hypertension    Past Surgical History  Procedure Laterality Date  . Above knee leg amputation    . Femoral-popliteal bypass graft     Social History:   reports that she has never smoked. She does not have any smokeless tobacco history on file. She reports that she does not drink alcohol or use illicit drugs.  No family history on file.  Medications: Patient's Medications  New Prescriptions   No medications on file  Previous Medications   ACETAMINOPHEN (TYLENOL) 325 MG TABLET    Take 650 mg by mouth every 6 (six) hours as needed for pain.   ASPIRIN 81 MG TABLET    Take 81 mg by mouth daily.   CLONIDINE (CATAPRES - DOSED IN MG/24 HR) 0.2  MG/24HR PATCH    Place 1 patch onto the skin once a week.   FENTANYL (DURAGESIC - DOSED MCG/HR) 50 MCG/HR    Remove old patch and then apply one patch topically every 72 hours rotate sites; nurses must check placement every shift and notify DON if missing   FISH OIL-OMEGA-3 FATTY ACIDS 1000 MG CAPSULE    Take 1 g by mouth daily.   FUROSEMIDE (LASIX) 20 MG TABLET    Take 40 mg by mouth daily.   GABAPENTIN (NEURONTIN) 300 MG CAPSULE    Take 600 mg by mouth 3 (three) times daily.    GEMFIBROZIL (LOPID) 600 MG  TABLET    Take 600 mg by mouth 2 (two) times daily before a meal.   INSULIN ASPART (NOVOLOG) 100 UNIT/ML INJECTION    Inject 7-10 Units into the skin 3 (three) times daily before meals. Give 10 units units with breakfast and lunch and 7 units for dinner   INSULIN GLARGINE (LANTUS) 100 UNIT/ML INJECTION    Inject 66 Units into the skin daily.    LEVOTHYROXINE (SYNTHROID, LEVOTHROID) 100 MCG TABLET    Take 100 mcg by mouth daily before breakfast.   LINAGLIPTIN (TRADJENTA) 5 MG TABS TABLET    Take 5 mg by mouth daily.   LORAZEPAM (ATIVAN) 0.5 MG TABLET    Take one tablet by mouth every 8 hours as needed   LOSARTAN (COZAAR) 25 MG TABLET    Take 25 mg by mouth 2 (two) times daily.   METOPROLOL TARTRATE (LOPRESSOR) 25 MG TABLET    Take 25 mg by mouth 2 (two) times daily.   MIRABEGRON ER (MYRBETRIQ) 50 MG TB24 TABLET    Take 50 mg by mouth daily.   NITROGLYCERIN (NITROSTAT) 0.4 MG SL TABLET    Place 0.4 mg under the tongue every 5 (five) minutes as needed. For chest pain    OXYCODONE (OXY IR/ROXICODONE) 5 MG IMMEDIATE RELEASE TABLET    Take two tablets by mouth every four hours as needed for pain   POLYETHYLENE GLYCOL (MIRALAX / GLYCOLAX) PACKET    Take 17 g by mouth daily. Mix in 8 ounces of water; for constipation    ROSUVASTATIN (CRESTOR) 40 MG TABLET    Take 40 mg by mouth daily.   SENNA (SENOKOT) 8.6 MG TABS    Take 2 tablets by mouth daily.     SODIUM CHLORIDE (OCEAN) 0.65 % NASAL SPRAY    Place 1 spray into the nose 2 (two) times daily as needed for congestion.   ZOLPIDEM (AMBIEN) 5 MG TABLET    1/2 by mouth at bedtime as needed for Insomnia  Modified Medications   No medications on file  Discontinued Medications   No medications on file     Physical Exam: Physical Exam  Nursing note and vitals reviewed. Constitutional: She is oriented to person, place, and time. No distress.  HENT:  Head: Normocephalic and atraumatic.  Mouth/Throat: Oropharynx is clear and moist.  Eyes: Pupils are  equal, round, and reactive to light.  Neck: Normal range of motion. Neck supple.  Cardiovascular: Normal rate, regular rhythm, normal heart sounds and intact distal pulses.  Exam reveals no gallop and no friction rub.   No murmur heard. Pulmonary/Chest: Effort normal and breath sounds normal. No respiratory distress. She has no wheezes.  Abdominal: Bowel sounds are normal. She exhibits no mass. Distention: mildy firm and slightly distended. There is no tenderness. There is no rebound and no guarding.  Musculoskeletal: Normal  range of motion. She exhibits no edema and no tenderness.  Neurological: She is alert and oriented to person, place, and time. Abnormal coordination: at baseline, struggles with right AKA deficits.  Skin: Skin is warm and dry. She is not diaphoretic. There is pallor.  Psychiatric: Mood and affect normal.    Labs reviewed: Lipid Profile  Result: 11/20/2012 4:00 PM ( Status: F )  Cholesterol 250 H 0-200 mg/dL SLN C  Triglyceride 161 H <150 mg/dL SLN  HDL Cholesterol 35 L >39 mg/dL SLN  Total Chol/HDL Ratio 7.1 Ratio SLN  VLDL Cholesterol (Calc) 44 H 0-40 mg/dL SLN  LDL Cholesterol (Calc) 171 H  BMP with Estimated GFR  Result: 12/31/2012 12:07 PM ( Status: F )  Sodium 141 135-145 mEq/L SLN  Potassium 3.8 3.5-5.3 mEq/L SLN  Chloride 103 96-112 mEq/L SLN  CO2 26 19-32 mEq/L SLN  Glucose 173 H 70-99 mg/dL SLN  BUN 14 0-96 mg/dL SLN  Creatinine 0.45 H 0.50-1.10 mg/dL SLN  Calcium 8.1 L 4.0-98.1 mg/dL SLN  Est GFR, African American 44 L mL/min SLN  Est GFR, NonAfrican American 38 L mL/min SLN C  CBC NO Diff (Complete Blood Count)  Result: 12/31/2012 1:07 PM ( Status: F )  WBC 6.7 4.0-10.5 K/uL SLN  RBC 3.85 L 3.87-5.11 MIL/uL SLN  Hemoglobin 10.4 L 12.0-15.0 g/dL SLN  Hematocrit 19.1 L 36.0-46.0 % SLN  MCV 83.6 78.0-100.0 fL SLN  MCH 27.0 26.0-34.0 pg SLN  MCHC 32.3 30.0-36.0 g/dL SLN  RDW 47.8 29.5-62.1 % SLN  Platelet Count 284 150-400 K/uL SLN  Lipid Profile    Result: 12/31/2012 12:07 PM ( Status: F )  Cholesterol 206 H 0-200 mg/dL SLN C  Triglyceride 308 H <150 mg/dL SLN  HDL Cholesterol 36 L >39 mg/dL SLN  Total Chol/HDL Ratio 5.7 Ratio SLN  VLDL Cholesterol (Calc) 33 6-57 mg/dL SLN  LDL Cholesterol (Calc) 137 H 0-99 mg/dL SLN C  Hemoglobin Q4O  Result: 12/31/2012 4:29 PM ( Status: F )  Hemoglobin A1C 8.6 H <5.7 % SLN C  Estimated Average Glucose 200 H <117 mg/dL SLN  Lipid Profile  Result: 01/13/2013 3:06 PM ( Status: F )  Cholesterol 204 H 0-200 mg/dL SLN C  Triglyceride 962 <150 mg/dL SLN  HDL Cholesterol 35 L >39 mg/dL SLN  Total Chol/HDL Ratio 5.8 Ratio SLN  VLDL Cholesterol (Calc) 23 9-52 mg/dL SLN  LDL Cholesterol (Calc) 146 H 0-99  CBC NO Diff (Complete Blood Count)  Result: 04/08/2013 12:02 PM ( Status: F )  WBC 7.3 4.0-10.5 K/uL SLN  RBC 3.96 3.87-5.11 MIL/uL SLN  Hemoglobin 10.9 L 12.0-15.0 g/dL SLN  Hematocrit 84.1 L 36.0-46.0 % SLN  MCV 82.8 78.0-100.0 fL SLN  MCH 27.5 26.0-34.0 pg SLN  MCHC 33.2 30.0-36.0 g/dL SLN  RDW 32.4 40.1-02.7 % SLN  Platelet Count 261 150-400 K/uL SLN  Comprehensive Metabolic Panel  Result: 04/08/2013 1:41 PM ( Status: F )  Sodium 139 135-145 mEq/L SLN  Potassium 3.6 3.5-5.3 mEq/L SLN  Chloride 104 96-112 mEq/L SLN  CO2 28 19-32 mEq/L SLN  Glucose 192 H 70-99 mg/dL SLN  BUN 13 2-53 mg/dL SLN  Creatinine 6.64 H 0.50-1.10 mg/dL SLN  Bilirubin, Total 0.2 L 0.3-1.2 mg/dL SLN  Alkaline Phosphatase 116 39-117 U/L SLN  AST/SGOT 12 0-37 U/L SLN  ALT/SGPT <8 0-35 U/L SLN  Total Protein 6.1 6.0-8.3 g/dL SLN  Albumin 2.9 L 4.0-3.4 g/dL SLN  Calcium 8.5 7.4-25.9 mg/dL SLN  TSH, Ultrasensitive  Result:  04/08/2013 2:17 PM ( Status: F )  TSH 0.070 L 0.350-4.500 uIU/mL SLN  Assessment/Plan  1. HYPERTENSION BP controlled with current regimen; will continue Catapres 0.2mg /24 hr patch, Cozaar at 25mg .  2. CVA (cerebral vascular accident) Continue supportive care and complication prevention.  3.  CHF (congestive heart failure) No recent exacerbations or fluid volume overload. Will continue current lasix dose.   4. Diabetes mellitus with neurological manifestations, uncontrolled Will assess HgbA1C and follow results. Patient continues to refuse doses due to perceived overdosage. Encourage patient in compliance and monitor for complication. Continue Tradjenta and SS insulin.   5. Unspecified hypothyroidism Is tolerating Synthroid dose; will recheck a TSH.   6. Neuropathy Neuropathy pain controlled with current regimen. Will continue neurontin at 300 mg dose and fentanyl patch.   7. Acute renal insufficiency Will evaluate a CMP today and follow for any necessary treatment or referral.   8. SEIZURE DISORDER No recent seizure activity and not on medications. Will continue to monitor and treat as needed.   9. Hyperlipidemia noncompliant with diet; conts on lopid and crestor; will get fasting lipid panel to follow up lipids  Labs/tests ordered Lipid Panel, HgbA1C, TSH, CBC, CMP   JESSICA KARAM, NP

## 2013-07-07 NOTE — Progress Notes (Signed)
This encounter was created in error - please disregard.

## 2013-07-24 ENCOUNTER — Other Ambulatory Visit: Payer: Self-pay | Admitting: *Deleted

## 2013-07-24 MED ORDER — FENTANYL 50 MCG/HR TD PT72: MEDICATED_PATCH | TRANSDERMAL | Status: DC

## 2013-07-24 NOTE — Telephone Encounter (Signed)
[facility] 

## 2013-08-14 ENCOUNTER — Non-Acute Institutional Stay (SKILLED_NURSING_FACILITY): Payer: Medicare Other | Admitting: Nurse Practitioner

## 2013-08-14 ENCOUNTER — Encounter: Payer: Self-pay | Admitting: Nurse Practitioner

## 2013-08-14 DIAGNOSIS — E1165 Type 2 diabetes mellitus with hyperglycemia: Secondary | ICD-10-CM

## 2013-08-14 DIAGNOSIS — I6350 Cerebral infarction due to unspecified occlusion or stenosis of unspecified cerebral artery: Secondary | ICD-10-CM

## 2013-08-14 DIAGNOSIS — E039 Hypothyroidism, unspecified: Secondary | ICD-10-CM

## 2013-08-14 DIAGNOSIS — IMO0002 Reserved for concepts with insufficient information to code with codable children: Secondary | ICD-10-CM

## 2013-08-14 DIAGNOSIS — I509 Heart failure, unspecified: Secondary | ICD-10-CM

## 2013-08-14 DIAGNOSIS — G589 Mononeuropathy, unspecified: Secondary | ICD-10-CM

## 2013-08-14 DIAGNOSIS — I10 Essential (primary) hypertension: Secondary | ICD-10-CM

## 2013-08-14 DIAGNOSIS — I639 Cerebral infarction, unspecified: Secondary | ICD-10-CM

## 2013-08-14 DIAGNOSIS — R32 Unspecified urinary incontinence: Secondary | ICD-10-CM

## 2013-08-14 DIAGNOSIS — E1149 Type 2 diabetes mellitus with other diabetic neurological complication: Secondary | ICD-10-CM

## 2013-08-14 DIAGNOSIS — E785 Hyperlipidemia, unspecified: Secondary | ICD-10-CM

## 2013-08-14 DIAGNOSIS — G629 Polyneuropathy, unspecified: Secondary | ICD-10-CM

## 2013-08-14 NOTE — Progress Notes (Signed)
Patient ID: Allison Washington, female   DOB: [birth year], 68 y.o.   MRN: [id number]    Nursing Home Location:  [facility]   Place of Service: SNF (31)  PCP: Kimber RelicGREEN, ARTHUR G, MD  No Known Allergies  Chief Complaint  Patient presents with  . Medical Managment of Chronic Issues    HPI:  This patient is a 68 year old female who has an extensive history of uncontrolled diabetes with complication, right AKA, hypertension, CHF, CVA, aphasia, hypothyroidism, neuropathy, and renal insufficiency. There has been no significant change in patients health over the last month. She has pressure ulcers to left heel that is stable and non-infected, is non-complaint with treatment. She conts to refuse her insulin and SS coverage at times. Nursing without any new concerns, and pt does not express any complaints today. Review of Systems:  Review of Systems  Constitutional: Negative for weight loss and malaise/fatigue.  HENT: Negative for congestion, ear pain and sore throat.   Eyes: Negative for blurred vision.  Respiratory: Negative for cough, sputum production, shortness of breath and wheezing.   Cardiovascular: Negative for chest pain, palpitations and leg swelling.  Gastrointestinal: Negative for heartburn, abdominal pain, diarrhea and constipation.  Genitourinary: Negative for dysuria, urgency and frequency.  Musculoskeletal: Negative for falls, joint pain and myalgias.  Skin: Negative for itching and rash.  Neurological: Negative for dizziness, tingling, weakness and headaches.  Psychiatric/Behavioral: Negative for depression and memory loss. The patient is not nervous/anxious and does not have insomnia.      Past Medical History  Diagnosis Date  . 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 Diabetes mellitus     Prone to hypoglycemia  . Aphagia       Status post numerous  . Dysphagia   . Peripheral vascular disease, unspecified   . Arterial disease   . Depressive disorder   . Hypothyroid   . Seizures   . MRSA infection     History of MRSA in the left leg wound and sacrum in the past-has been seen by vascular surgery and declined any type of intervention including arteriogram  . Diverticulosis     GI bleed in 2007-s/p endoscopy and colonoscopy in 2007 s/p massive GIB consittent with Diveerticulosis  . DVT (deep venous thrombosis)     Not on Coumadin  . Anemia     Chronic  . Non-smoker   . Stroke   . Hypertension   . History of DVT (deep vein thrombosis) 03/06/2011  . Nursing home acquired MRSA infection 03/06/2011    History of MRSA in the left leg wound and sacrum in the past-has been seen by vascular surgery and declined any type of intervention including arteriogram   . SEIZURE DISORDER 12/17/2006    Qualifier: Diagnosis of  By: Clent RidgesFry MD, Tera MaterStephen A    Past Surgical History  Procedure Laterality Date  . Above knee leg amputation    . Femoral-popliteal bypass graft     Social History:   reports that she has never smoked. She does not have any smokeless tobacco history on file. She reports that she does not drink alcohol or use illicit drugs.  No family history on file.  Medications: Patient's Medications  New Prescriptions   No medications on file  Previous Medications   ACETAMINOPHEN (TYLENOL) 325 MG TABLET    Take  650 mg by mouth every 6 (six) hours as needed for pain.   ASPIRIN 81 MG TABLET    Take 81 mg by mouth daily.   CLONIDINE (CATAPRES - DOSED IN MG/24 HR) 0.2 MG/24HR PATCH    Place 1 patch onto the skin once a week.   FENTANYL (DURAGESIC - DOSED MCG/HR) 50 MCG/HR    Remove old patch and then apply one patch topically every 72 hours rotate sites; nurses must check placement every shift and notify DON if missing   FISH OIL-OMEGA-3 FATTY ACIDS 1000 MG CAPSULE    Take 1 g by mouth daily.   FUROSEMIDE (LASIX) 20 MG  TABLET    Take 40 mg by mouth daily.   GABAPENTIN (NEURONTIN) 300 MG CAPSULE    Take 600 mg by mouth 3 (three) times daily.    GEMFIBROZIL (LOPID) 600 MG TABLET    Take 600 mg by mouth 2 (two) times daily before a meal.   INSULIN ASPART (NOVOLOG) 100 UNIT/ML INJECTION    Inject 7-10 Units into the skin 3 (three) times daily before meals. Give 10 units units with breakfast and lunch and 7 units for dinner   INSULIN GLARGINE (LANTUS) 100 UNIT/ML INJECTION    Inject 66 Units into the skin daily.    LEVOTHYROXINE (SYNTHROID, LEVOTHROID) 88 MCG TABLET    Take 88 mcg by mouth daily before breakfast.   LINAGLIPTIN (TRADJENTA) 5 MG TABS TABLET    Take 5 mg by mouth daily.   LORAZEPAM (ATIVAN) 0.5 MG TABLET    Take one tablet by mouth every 8 hours as needed   LOSARTAN (COZAAR) 25 MG TABLET    Take 25 mg by mouth 2 (two) times daily.   METOPROLOL TARTRATE (LOPRESSOR) 25 MG TABLET    Take 25 mg by mouth 2 (two) times daily.   MIRABEGRON ER (MYRBETRIQ) 50 MG TB24 TABLET    Take 50 mg by mouth daily.   NITROGLYCERIN (NITROSTAT) 0.4 MG SL TABLET    Place 0.4 mg under the tongue every 5 (five) minutes as needed. For chest pain    OXYCODONE (OXY IR/ROXICODONE) 5 MG IMMEDIATE RELEASE TABLET    Take two tablets by mouth every four hours as needed for pain   POLYETHYLENE GLYCOL (MIRALAX / GLYCOLAX) PACKET    Take 17 g by mouth daily. Mix in 8 ounces of water; for constipation    ROSUVASTATIN (CRESTOR) 40 MG TABLET    Take 40 mg by mouth daily.   SENNA (SENOKOT) 8.6 MG TABS    Take 2 tablets by mouth daily.     SODIUM CHLORIDE (OCEAN) 0.65 % NASAL SPRAY    Place 1 spray into the nose 2 (two) times daily as needed for congestion.   ZOLPIDEM (AMBIEN) 5 MG TABLET    1/2 by mouth at bedtime as needed for Insomnia  Modified Medications   No medications on file  Discontinued Medications   LEVOTHYROXINE (SYNTHROID, LEVOTHROID) 100 MCG TABLET    Take 100 mcg by mouth daily before breakfast.     Physical Exam:  Filed  Vitals:   08/14/13 1207  BP: 142/61  Pulse: 76  Temp: 98.8 F (37.1 C)  Resp: 20    Physical Exam  Nursing note and vitals reviewed. Constitutional: She is oriented to person, place, and time and well-developed, well-nourished, and in no distress.  HENT:  Head: Normocephalic and atraumatic.  Mouth/Throat: Oropharynx is clear and moist.  Eyes: Pupils are equal, round, and reactive to  light.  Neck: Normal range of motion. Neck supple.  Cardiovascular: Normal rate, regular rhythm, normal heart sounds and intact distal pulses.  Exam reveals no gallop and no friction rub.   No murmur heard. Pulmonary/Chest: Effort normal and breath sounds normal. No respiratory distress. She has no wheezes.  Abdominal: Bowel sounds are normal. Distention: mildy firm and slightly distended- chronic. There is no tenderness.  Musculoskeletal: Normal range of motion. She exhibits no edema and no tenderness.  Neurological: She is alert and oriented to person, place, and time.  Skin: Skin is warm and dry. She is not diaphoretic.  Psychiatric: Mood and affect normal.     Labs reviewed: Lipid Profile  Result: 11/20/2012 4:00 PM ( Status: F )  Cholesterol 250 H 0-200 mg/dL SLN C  Triglyceride 161 H <150 mg/dL SLN  HDL Cholesterol 35 L >39 mg/dL SLN  Total Chol/HDL Ratio 7.1 Ratio SLN  VLDL Cholesterol (Calc) 44 H 0-40 mg/dL SLN  LDL Cholesterol (Calc) 171 H  BMP with Estimated GFR  Result: 12/31/2012 12:07 PM ( Status: F )  Sodium 141 135-145 mEq/L SLN  Potassium 3.8 3.5-5.3 mEq/L SLN  Chloride 103 96-112 mEq/L SLN  CO2 26 19-32 mEq/L SLN  Glucose 173 H 70-99 mg/dL SLN  BUN 14 0-96 mg/dL SLN  Creatinine 0.45 H 0.50-1.10 mg/dL SLN  Calcium 8.1 L 4.0-98.1 mg/dL SLN  Est GFR, African American 44 L mL/min SLN  Est GFR, NonAfrican American 38 L mL/min SLN C  CBC NO Diff (Complete Blood Count)  Result: 12/31/2012 1:07 PM ( Status: F )  WBC 6.7 4.0-10.5 K/uL SLN  RBC 3.85 L 3.87-5.11 MIL/uL SLN  Hemoglobin  10.4 L 12.0-15.0 g/dL SLN  Hematocrit 19.1 L 36.0-46.0 % SLN  MCV 83.6 78.0-100.0 fL SLN  MCH 27.0 26.0-34.0 pg SLN  MCHC 32.3 30.0-36.0 g/dL SLN  RDW 47.8 29.5-62.1 % SLN  Platelet Count 284 150-400 K/uL SLN  Lipid Profile  Result: 12/31/2012 12:07 PM ( Status: F )  Cholesterol 206 H 0-200 mg/dL SLN C  Triglyceride 308 H <150 mg/dL SLN  HDL Cholesterol 36 L >39 mg/dL SLN  Total Chol/HDL Ratio 5.7 Ratio SLN  VLDL Cholesterol (Calc) 33 6-57 mg/dL SLN  LDL Cholesterol (Calc) 137 H 0-99 mg/dL SLN C  Hemoglobin Q4O  Result: 12/31/2012 4:29 PM ( Status: F )  Hemoglobin A1C 8.6 H <5.7 % SLN C  Estimated Average Glucose 200 H <117 mg/dL SLN  Lipid Profile  Result: 01/13/2013 3:06 PM ( Status: F )  Cholesterol 204 H 0-200 mg/dL SLN C  Triglyceride 962 <150 mg/dL SLN  HDL Cholesterol 35 L >39 mg/dL SLN  Total Chol/HDL Ratio 5.8 Ratio SLN  VLDL Cholesterol (Calc) 23 9-52 mg/dL SLN  LDL Cholesterol (Calc) 146 H 0-99  CBC NO Diff (Complete Blood Count)  Result: 04/08/2013 12:02 PM ( Status: F )  WBC 7.3 4.0-10.5 K/uL SLN  RBC 3.96 3.87-5.11 MIL/uL SLN  Hemoglobin 10.9 L 12.0-15.0 g/dL SLN  Hematocrit 84.1 L 36.0-46.0 % SLN  MCV 82.8 78.0-100.0 fL SLN  MCH 27.5 26.0-34.0 pg SLN  MCHC 33.2 30.0-36.0 g/dL SLN  RDW 32.4 40.1-02.7 % SLN  Platelet Count 261 150-400 K/uL SLN  Comprehensive Metabolic Panel  Result: 04/08/2013 1:41 PM ( Status: F )  Sodium 139 135-145 mEq/L SLN  Potassium 3.6 3.5-5.3 mEq/L SLN  Chloride 104 96-112 mEq/L SLN  CO2 28 19-32 mEq/L SLN  Glucose 192 H 70-99 mg/dL SLN  BUN 13 2-53 mg/dL SLN  Creatinine 1.16 H 0.50-1.10 mg/dL SLN  Bilirubin, Total 0.2 L 0.3-1.2 mg/dL SLN  Alkaline Phosphatase 116 39-117 U/L SLN  AST/SGOT 12 0-37 U/L SLN  ALT/SGPT <8 0-35 U/L SLN  Total Protein 6.1 6.0-8.3 g/dL SLN  Albumin 2.9 L 7.8-2.9 g/dL SLN  Calcium 8.5 5.6-21.3 mg/dL SLN  TSH, Ultrasensitive  Result: 04/08/2013 2:17 PM ( Status: F )  TSH 0.070 L 0.350-4.500 uIU/mL  SLN  CBC NO Diff (Complete Blood Count)    Result: 07/10/2013 3:12 PM   ( Status: F )     C WBC 6.1     4.0-10.5 K/uL SLN   RBC 3.71   L 3.87-5.11 MIL/uL SLN   Hemoglobin 10.0   L 12.0-15.0 g/dL SLN   Hematocrit 08.6   L 36.0-46.0 % SLN   MCV 81.4     78.0-100.0 fL SLN   MCH 27.0     26.0-34.0 pg SLN   MCHC 33.1     30.0-36.0 g/dL SLN   RDW 57.8     46.9-62.9 % SLN   Platelet Count 294     150-400 K/uL SLN   Lipid Profile    Result: 07/10/2013 3:33 PM   ( Status: F )       Cholesterol 186     0-200 mg/dL SLN C Triglyceride 528   H <150 mg/dL SLN   HDL Cholesterol 32   L >39 mg/dL SLN   Total Chol/HDL Ratio 5.8      Ratio SLN   VLDL Cholesterol (Calc) 38     0-40 mg/dL SLN   LDL Cholesterol (Calc) 116   H 0-99 mg/dL SLN C TSH, Ultrasensitive    Result: 07/10/2013 4:12 PM   ( Status: F )       TSH 0.042   L 0.350-4.500 uIU/mL SLN   Hemoglobin A1C    Result: 07/10/2013 6:00 PM   ( Status: F )       Hemoglobin A1C 8.5   H <5.7 % SLN C Estimated Average Glucose 197   H <117 mg/dL SLN    Assessment/Plan 1. CVA (cerebral vascular accident) -conts on asa, proper hypertensive control and medication management in place  to control cholesterol being done despite medication and diet non-compliance   2. CHF (congestive heart failure) -without signs of exacerbation or worsening symptoms, conts lasix  3. HYPERTENSION -well controlled on current medications  4. Diabetes mellitus with neurological manifestations, uncontrolled -overall blood sugars have improved when pt taking medications, still refusing medication at times, appears to have 1 episode of low blood sugar (67) in the past month but still with blood sugars in the 400s; will cont close monitoring of blood sugars as A1c last month is still elevated at 8.5  5. Unspecified hypothyroidism -TSH low, synthroid has bee reduced to 88 mcg daily and follow up TSH pending for 5/1  6. Neuropathy -neuropathy controlled on neurontin   7.  URINARY INCONTINENCE -improved with mybetriq; will cont medications  8. Other and unspecified hyperlipidemia -overall improved with cresor and lopid, LDL still not at goal,will cont to monitor, pt remains non-complaint with diet   9. Insomnia Attempted to stop Ambien, pt reports she has to have medication at bedtime at it was restarted to 2.5 mg q hs PRN

## 2013-08-21 ENCOUNTER — Other Ambulatory Visit: Payer: Self-pay | Admitting: *Deleted

## 2013-08-21 MED ORDER — OXYCODONE HCL 5 MG PO TABS: ORAL_TABLET | ORAL | Status: DC

## 2013-08-21 NOTE — Telephone Encounter (Signed)
[facility] 

## 2013-08-26 ENCOUNTER — Other Ambulatory Visit: Payer: Self-pay | Admitting: *Deleted

## 2013-08-26 MED ORDER — FENTANYL 50 MCG/HR TD PT72: MEDICATED_PATCH | TRANSDERMAL | Status: DC

## 2013-08-26 NOTE — Telephone Encounter (Signed)
[facility] 

## 2013-09-15 ENCOUNTER — Non-Acute Institutional Stay (SKILLED_NURSING_FACILITY): Payer: Medicare Other | Admitting: Nurse Practitioner

## 2013-09-15 DIAGNOSIS — IMO0002 Reserved for concepts with insufficient information to code with codable children: Secondary | ICD-10-CM

## 2013-09-15 DIAGNOSIS — N289 Disorder of kidney and ureter, unspecified: Secondary | ICD-10-CM

## 2013-09-15 DIAGNOSIS — I509 Heart failure, unspecified: Secondary | ICD-10-CM

## 2013-09-15 DIAGNOSIS — G629 Polyneuropathy, unspecified: Secondary | ICD-10-CM

## 2013-09-15 DIAGNOSIS — E1149 Type 2 diabetes mellitus with other diabetic neurological complication: Secondary | ICD-10-CM

## 2013-09-15 DIAGNOSIS — E039 Hypothyroidism, unspecified: Secondary | ICD-10-CM

## 2013-09-15 DIAGNOSIS — E1165 Type 2 diabetes mellitus with hyperglycemia: Secondary | ICD-10-CM

## 2013-09-15 DIAGNOSIS — I639 Cerebral infarction, unspecified: Secondary | ICD-10-CM

## 2013-09-15 DIAGNOSIS — G589 Mononeuropathy, unspecified: Secondary | ICD-10-CM

## 2013-09-15 DIAGNOSIS — I10 Essential (primary) hypertension: Secondary | ICD-10-CM

## 2013-09-15 DIAGNOSIS — I6350 Cerebral infarction due to unspecified occlusion or stenosis of unspecified cerebral artery: Secondary | ICD-10-CM

## 2013-09-15 NOTE — Progress Notes (Signed)
Patient ID: Allison Washington, female   DOB: 13-Nov-1945, 68 y.o.   MRN: [id number]    Nursing Home Location:  [facility]   Place of Service: SNF (31)  PCP: Kimber Relic, MD  No Known Allergies  Chief Complaint  Patient presents with  . Medical Management of Chronic Issues    HPI:  This patient is a 68 year old female who has an extensive history of uncontrolled diabetes with complication, right AKA, hypertension, CHF, CVA, aphasia, hypothyroidism, neuropathy, and renal insufficiency. There has been no significant change in patients health over the last month. She had pressure ulcers to left heel that has resolved, and now with pressure ulcer to left buttocks, being followed by nursing with dressing applied. She conts to refuse her insulin and SS coverage at times but overall as been more complaint. No Nursing concerns, and pt does not have any complaints today  Review of Systems:  Review of Systems  Constitutional: Negative for weight loss and malaise/fatigue.  HENT: Negative for congestion, ear pain and sore throat.   Eyes: Negative for blurred vision.  Respiratory: Negative for cough, sputum production and shortness of breath.   Cardiovascular: Negative for chest pain, palpitations and leg swelling.  Gastrointestinal: Negative for heartburn, abdominal pain, diarrhea and constipation.  Genitourinary: Negative for dysuria, urgency and frequency.  Musculoskeletal: Negative for falls, joint pain and myalgias.  Skin: Negative for itching and rash.       Pressure ulcer to right buttock which is uncomfortable  Neurological: Negative for dizziness, tingling, weakness and headaches.  Psychiatric/Behavioral: Negative for depression and memory loss. The patient is not nervous/anxious and does not have insomnia.      Past Medical History  Diagnosis Date  . Hemiplegia and hemiparesis     5x cva's known history of bilateral  subcortical strokes and pseudobulbar, MRI with  acute infarct of the left basal ganglia, posterior limb of  . Diabetes mellitus     Prone to hypoglycemia  . Aphagia     Status post numerous  . Dysphagia   . Peripheral vascular disease, unspecified   . Arterial disease   . Depressive disorder   . Hypothyroid   . Seizures   . MRSA infection     History of MRSA in the left leg wound and sacrum in the past-has been seen by vascular surgery and declined any type of intervention including arteriogram  . Diverticulosis     GI bleed in 2007-s/p endoscopy and colonoscopy in 2007 s/p massive GIB consittent with Diveerticulosis  . DVT (deep venous thrombosis)     Not on Coumadin  . Anemia     Chronic  . Non-smoker   . Stroke   . Hypertension   . History of DVT (deep vein thrombosis) 03/06/2011  . Nursing home acquired MRSA infection 03/06/2011    History of MRSA in the left leg wound and sacrum in the past-has been seen by vascular surgery and declined any type of intervention including arteriogram   . SEIZURE DISORDER 12/17/2006    Qualifier: Diagnosis of  By: Clent Ridges MD, Tera Mater    Past Surgical History  Procedure Laterality Date  . Above knee leg amputation    . Femoral-popliteal bypass graft     Social History:   reports that she has never smoked. She does not have any smokeless tobacco history on file. She reports that she does not drink alcohol or use illicit drugs.  No family history on file.  Medications: Patient's Medications  New Prescriptions   No medications on file  Previous Medications   ACETAMINOPHEN (TYLENOL) 325 MG TABLET    Take 650 mg by mouth every 6 (six) hours as needed for pain.   ASPIRIN 81 MG TABLET    Take 81 mg by mouth daily.   CLONIDINE (CATAPRES - DOSED IN MG/24 HR) 0.2 MG/24HR PATCH    Place 1 patch onto the skin once a week.   FENTANYL (DURAGESIC - DOSED MCG/HR) 50 MCG/HR    Remove old patch and then apply one patch topically every 72 hours rotate sites; nurses must check placement every shift and  notify DON if missing   FISH OIL-OMEGA-3 FATTY ACIDS 1000 MG CAPSULE    Take 1 g by mouth daily.   FUROSEMIDE (LASIX) 20 MG TABLET    Take 40 mg by mouth daily.   GABAPENTIN (NEURONTIN) 300 MG CAPSULE    Take 600 mg by mouth 3 (three) times daily.    GEMFIBROZIL (LOPID) 600 MG TABLET    Take 600 mg by mouth 2 (two) times daily before a meal.   INSULIN ASPART (NOVOLOG) 100 UNIT/ML INJECTION    Inject 7-10 Units into the skin 3 (three) times daily before meals. Give 10 units units with breakfast and lunch and 7 units for dinner   INSULIN GLARGINE (LANTUS) 100 UNIT/ML INJECTION    Inject 66 Units into the skin daily.    LEVOTHYROXINE (SYNTHROID, LEVOTHROID) 88 MCG TABLET    Take 88 mcg by mouth daily before breakfast.   LINAGLIPTIN (TRADJENTA) 5 MG TABS TABLET    Take 5 mg by mouth daily.   LORAZEPAM (ATIVAN) 0.5 MG TABLET    Take one tablet by mouth every 8 hours as needed   LOSARTAN (COZAAR) 25 MG TABLET    Take 25 mg by mouth 2 (two) times daily.   METOPROLOL TARTRATE (LOPRESSOR) 25 MG TABLET    Take 25 mg by mouth 2 (two) times daily.   MIRABEGRON ER (MYRBETRIQ) 50 MG TB24 TABLET    Take 50 mg by mouth daily.   NITROGLYCERIN (NITROSTAT) 0.4 MG SL TABLET    Place 0.4 mg under the tongue every 5 (five) minutes as needed. For chest pain    OXYCODONE (OXY IR/ROXICODONE) 5 MG IMMEDIATE RELEASE TABLET    Take two tablets by mouth every four hours as needed for pain   POLYETHYLENE GLYCOL (MIRALAX / GLYCOLAX) PACKET    Take 17 g by mouth daily. Mix in 8 ounces of water; for constipation    ROSUVASTATIN (CRESTOR) 40 MG TABLET    Take 40 mg by mouth daily.   SENNA (SENOKOT) 8.6 MG TABS    Take 2 tablets by mouth daily.     SODIUM CHLORIDE (OCEAN) 0.65 % NASAL SPRAY    Place 1 spray into the nose 2 (two) times daily as needed for congestion.   ZOLPIDEM (AMBIEN) 5 MG TABLET    1/2 by mouth at bedtime as needed for Insomnia  Modified Medications   No medications on file  Discontinued Medications   No  medications on file     Physical Exam:  Filed Vitals:   09/15/13 1047  BP: 139/75  Pulse: 70  Temp: 97.7 F (36.5 C)  Resp: 20  Weight: 171 lb (77.565 kg)   Physical Exam  Nursing note and vitals reviewed. Constitutional: She is oriented to person, place, and time and well-developed, well-nourished, and in no distress.  HENT:  Head: Normocephalic  and atraumatic.  Mouth/Throat: Oropharynx is clear and moist.  Eyes: Pupils are equal, round, and reactive to light.  Neck: Normal range of motion. Neck supple.  Cardiovascular: Normal rate, regular rhythm, normal heart sounds and intact distal pulses.   Pulmonary/Chest: Effort normal and breath sounds normal. No respiratory distress. She has no wheezes.  Abdominal: Soft. Bowel sounds are normal. Distention: mildy firm and slightly distended- chronic. There is no tenderness.  Musculoskeletal: Normal range of motion. She exhibits no edema and no tenderness.  Right AKA  Neurological: She is alert and oriented to person, place, and time.  Skin: Skin is warm and dry. She is not diaphoretic.  Psychiatric: Mood and affect normal.      Labs reviewed: Lipid Profile  Result: 11/20/2012 4:00 PM ( Status: F )  Cholesterol 250 H 0-200 mg/dL SLN C  Triglyceride 161 H <150 mg/dL SLN  HDL Cholesterol 35 L >39 mg/dL SLN  Total Chol/HDL Ratio 7.1 Ratio SLN  VLDL Cholesterol (Calc) 44 H 0-40 mg/dL SLN  LDL Cholesterol (Calc) 171 H  BMP with Estimated GFR  Result: 12/31/2012 12:07 PM ( Status: F )  Sodium 141 135-145 mEq/L SLN  Potassium 3.8 3.5-5.3 mEq/L SLN  Chloride 103 96-112 mEq/L SLN  CO2 26 19-32 mEq/L SLN  Glucose 173 H 70-99 mg/dL SLN  BUN 14 0-96 mg/dL SLN  Creatinine 0.45 H 0.50-1.10 mg/dL SLN  Calcium 8.1 L 4.0-98.1 mg/dL SLN  Est GFR, African American 44 L mL/min SLN  Est GFR, NonAfrican American 38 L mL/min SLN C  CBC NO Diff (Complete Blood Count)  Result: 12/31/2012 1:07 PM ( Status: F )  WBC 6.7 4.0-10.5 K/uL SLN  RBC 3.85  L 3.87-5.11 MIL/uL SLN  Hemoglobin 10.4 L 12.0-15.0 g/dL SLN  Hematocrit 19.1 L 36.0-46.0 % SLN  MCV 83.6 78.0-100.0 fL SLN  MCH 27.0 26.0-34.0 pg SLN  MCHC 32.3 30.0-36.0 g/dL SLN  RDW 47.8 29.5-62.1 % SLN  Platelet Count 284 150-400 K/uL SLN  Lipid Profile  Result: 12/31/2012 12:07 PM ( Status: F )  Cholesterol 206 H 0-200 mg/dL SLN C  Triglyceride 308 H <150 mg/dL SLN  HDL Cholesterol 36 L >39 mg/dL SLN  Total Chol/HDL Ratio 5.7 Ratio SLN  VLDL Cholesterol (Calc) 33 6-57 mg/dL SLN  LDL Cholesterol (Calc) 137 H 0-99 mg/dL SLN C  Hemoglobin Q4O  Result: 12/31/2012 4:29 PM ( Status: F )  Hemoglobin A1C 8.6 H <5.7 % SLN C  Estimated Average Glucose 200 H <117 mg/dL SLN  Lipid Profile  Result: 01/13/2013 3:06 PM ( Status: F )  Cholesterol 204 H 0-200 mg/dL SLN C  Triglyceride 962 <150 mg/dL SLN  HDL Cholesterol 35 L >39 mg/dL SLN  Total Chol/HDL Ratio 5.8 Ratio SLN  VLDL Cholesterol (Calc) 23 9-52 mg/dL SLN  LDL Cholesterol (Calc) 146 H 0-99  CBC NO Diff (Complete Blood Count)  Result: 04/08/2013 12:02 PM ( Status: F )  WBC 7.3 4.0-10.5 K/uL SLN  RBC 3.96 3.87-5.11 MIL/uL SLN  Hemoglobin 10.9 L 12.0-15.0 g/dL SLN  Hematocrit 84.1 L 36.0-46.0 % SLN  MCV 82.8 78.0-100.0 fL SLN  MCH 27.5 26.0-34.0 pg SLN  MCHC 33.2 30.0-36.0 g/dL SLN  RDW 32.4 40.1-02.7 % SLN  Platelet Count 261 150-400 K/uL SLN  Comprehensive Metabolic Panel  Result: 04/08/2013 1:41 PM ( Status: F )  Sodium 139 135-145 mEq/L SLN  Potassium 3.6 3.5-5.3 mEq/L SLN  Chloride 104 96-112 mEq/L SLN  CO2 28 19-32 mEq/L SLN  Glucose 192  H 70-99 mg/dL SLN  BUN 13 1-61 mg/dL SLN  Creatinine 0.96 H 0.50-1.10 mg/dL SLN  Bilirubin, Total 0.2 L 0.3-1.2 mg/dL SLN  Alkaline Phosphatase 116 39-117 U/L SLN  AST/SGOT 12 0-37 U/L SLN  ALT/SGPT <8 0-35 U/L SLN  Total Protein 6.1 6.0-8.3 g/dL SLN  Albumin 2.9 L 0.4-5.4 g/dL SLN  Calcium 8.5 0.9-81.1 mg/dL SLN  TSH, Ultrasensitive  Result: 04/08/2013 2:17 PM ( Status: F )    TSH 0.070 L 0.350-4.500 uIU/mL SLN  CBC NO Diff (Complete Blood Count)  Result: 07/10/2013 3:12 PM ( Status: F ) C  WBC 6.1 4.0-10.5 K/uL SLN  RBC 3.71 L 3.87-5.11 MIL/uL SLN  Hemoglobin 10.0 L 12.0-15.0 g/dL SLN  Hematocrit 91.4 L 36.0-46.0 % SLN  MCV 81.4 78.0-100.0 fL SLN  MCH 27.0 26.0-34.0 pg SLN  MCHC 33.1 30.0-36.0 g/dL SLN  RDW 78.2 95.6-21.3 % SLN  Platelet Count 294 150-400 K/uL SLN  Lipid Profile  Result: 07/10/2013 3:33 PM ( Status: F )  Cholesterol 186 0-200 mg/dL SLN C  Triglyceride 086 H <150 mg/dL SLN  HDL Cholesterol 32 L >39 mg/dL SLN  Total Chol/HDL Ratio 5.8 Ratio SLN  VLDL Cholesterol (Calc) 38 5-78 mg/dL SLN  LDL Cholesterol (Calc) 116 H 0-99 mg/dL SLN C  TSH, Ultrasensitive  Result: 07/10/2013 4:12 PM ( Status: F )  TSH 0.042 L 0.350-4.500 uIU/mL SLN  Hemoglobin A1C  Result: 07/10/2013 6:00 PM ( Status: F )  Hemoglobin A1C 8.5 H <5.7 % SLN C  Estimated Average Glucose 197 H <117 mg/dL SLN  Basic Metabolic Panel    Result: 09/01/2013 6:05 PM   ( Status: F )     C Sodium 136     135-145 mEq/L SLN   Potassium 3.7     3.5-5.3 mEq/L SLN   Chloride 101     96-112 mEq/L SLN   CO2 19     19-32 mEq/L SLN   Glucose 170   H 70-99 mg/dL SLN   BUN 29   H 4-69 mg/dL SLN   Creatinine 6.29   H 0.50-1.10 mg/dL SLN   Calcium 8.9     5.2-84.1 mg/dL SLN   Lipid Profile    Result: 09/01/2013 6:05 PM   ( Status: F )       Cholesterol 196     0-200 mg/dL SLN C Triglyceride 324   H <150 mg/dL SLN   HDL Cholesterol 37   L >39 mg/dL SLN   Total Chol/HDL Ratio 5.3      Ratio SLN   VLDL Cholesterol (Calc) 43   H 0-40 mg/dL SLN   LDL Cholesterol (Calc) 116   H 0-99 mg/dL SLN C Liver Profile    Result: 09/01/2013 6:05 PM   ( Status: F )       Bilirubin, Total 0.3     0.2-1.2 mg/dL SLN   Bilirubin, Direct <0.1     0.0-0.3 mg/dL SLN   Indirect Bilirubin NOT CALC     0.2-1.2 mg/dL SLN   Alkaline Phosphatase 132   H 39-117 U/L SLN   AST/SGOT 21     0-37 U/L SLN   ALT/SGPT 10      0-35 U/L SLN   Total Protein 6.8     6.0-8.3 g/dL SLN   Albumin 3.4   L 4.0-1.0 g/dL SLN   Creatinine    Result: 09/01/2013 6:05 PM   ( Status: F )       Creatinine 2.63  H 0.50-1.10 mg/dL SLN   TSH, Ultrasensitive    Result: 09/01/2013 4:07 PM   ( Status: F )       TSH 0.360     0.350-4.500 uIU/mL SLN   Hemoglobin A1C    Result: 09/01/2013 4:23 PM   ( Status: F )       Hemoglobin A1C 9.3   H <5.7 % SLN C Estimated Average Glucose 220   H <117 mg/dL SLN    Assessment/Plan 1. HYPERTENSION - controlled on current medications  2. Diabetes mellitus with neurological manifestations, uncontrolled -A1c is worse, however pt not compliant with medications and diet, some low blood sugars due to inconsistency making it hard to adjust medication, will cont current doses at this time and provide ongoing monitoring .   3. Neuropathy -Patient is stable; continue current regimen. Will monitor and make changes as necessary.  4. CHF (congestive heart failure) -remains stable, no increases in swelling.   5. CVA (cerebral vascular accident) -conts on asa, plavix, statin, and antihypertensive  6. Unspecified hypothyroidism -tsh now WNL, will cont synthroid 88 mcg   7. Acute renal insufficiency -cr elevated to 2.6 on last lab, will follow up today and follow up accordingly   8. Pressure ulcer -conts to be followed by treatment nurse

## 2013-09-25 ENCOUNTER — Other Ambulatory Visit: Payer: Self-pay | Admitting: *Deleted

## 2013-09-25 MED ORDER — FENTANYL 50 MCG/HR TD PT72: MEDICATED_PATCH | TRANSDERMAL | Status: DC

## 2013-10-15 ENCOUNTER — Encounter (HOSPITAL_COMMUNITY): Payer: Self-pay | Admitting: Emergency Medicine

## 2013-10-15 ENCOUNTER — Emergency Department (HOSPITAL_COMMUNITY)
Admission: EM | Admit: 2013-10-15 | Discharge: 2013-10-15 | Disposition: A | Discharge status: Home / Self Care | Payer: Medicare Other | Attending: Emergency Medicine | Admitting: Emergency Medicine

## 2013-10-15 DIAGNOSIS — E119 Type 2 diabetes mellitus without complications: Secondary | ICD-10-CM | POA: Insufficient documentation

## 2013-10-15 DIAGNOSIS — Z86718 Personal history of other venous thrombosis and embolism: Secondary | ICD-10-CM | POA: Insufficient documentation

## 2013-10-15 DIAGNOSIS — Z794 Long term (current) use of insulin: Secondary | ICD-10-CM | POA: Insufficient documentation

## 2013-10-15 DIAGNOSIS — F329 Major depressive disorder, single episode, unspecified: Secondary | ICD-10-CM | POA: Insufficient documentation

## 2013-10-15 DIAGNOSIS — G40909 Epilepsy, unspecified, not intractable, without status epilepticus: Secondary | ICD-10-CM | POA: Insufficient documentation

## 2013-10-15 DIAGNOSIS — Z8673 Personal history of transient ischemic attack (TIA), and cerebral infarction without residual deficits: Secondary | ICD-10-CM | POA: Diagnosis not present

## 2013-10-15 DIAGNOSIS — F3289 Other specified depressive episodes: Secondary | ICD-10-CM | POA: Diagnosis not present

## 2013-10-15 DIAGNOSIS — M79609 Pain in unspecified limb: Secondary | ICD-10-CM | POA: Insufficient documentation

## 2013-10-15 DIAGNOSIS — Z79899 Other long term (current) drug therapy: Secondary | ICD-10-CM | POA: Insufficient documentation

## 2013-10-15 DIAGNOSIS — Z7982 Long term (current) use of aspirin: Secondary | ICD-10-CM | POA: Insufficient documentation

## 2013-10-15 DIAGNOSIS — Z8614 Personal history of Methicillin resistant Staphylococcus aureus infection: Secondary | ICD-10-CM | POA: Insufficient documentation

## 2013-10-15 DIAGNOSIS — I10 Essential (primary) hypertension: Secondary | ICD-10-CM | POA: Diagnosis not present

## 2013-10-15 DIAGNOSIS — S78119A Complete traumatic amputation at level between unspecified hip and knee, initial encounter: Secondary | ICD-10-CM | POA: Insufficient documentation

## 2013-10-15 DIAGNOSIS — Z8719 Personal history of other diseases of the digestive system: Secondary | ICD-10-CM | POA: Diagnosis not present

## 2013-10-15 DIAGNOSIS — Z862 Personal history of diseases of the blood and blood-forming organs and certain disorders involving the immune mechanism: Secondary | ICD-10-CM | POA: Insufficient documentation

## 2013-10-15 DIAGNOSIS — E039 Hypothyroidism, unspecified: Secondary | ICD-10-CM | POA: Insufficient documentation

## 2013-10-15 DIAGNOSIS — Z8669 Personal history of other diseases of the nervous system and sense organs: Secondary | ICD-10-CM | POA: Diagnosis not present

## 2013-10-15 DIAGNOSIS — M79604 Pain in right leg: Secondary | ICD-10-CM

## 2013-10-15 LAB — COMPREHENSIVE METABOLIC PANEL
ALBUMIN: 3.2 g/dL — AB (ref 3.5–5.2)
ALK PHOS: 180 U/L — AB (ref 39–117)
ALT: 23 U/L (ref 0–35)
AST: 52 U/L — AB (ref 0–37)
BUN: 24 mg/dL — AB (ref 6–23)
CHLORIDE: 97 meq/L (ref 96–112)
CO2: 27 mEq/L (ref 19–32)
Calcium: 9.6 mg/dL (ref 8.4–10.5)
Creatinine, Ser: 1.86 mg/dL — ABNORMAL HIGH (ref 0.50–1.10)
GFR calc Af Amer: 32 mL/min — ABNORMAL LOW (ref >90)
GFR calc non Af Amer: 27 mL/min — ABNORMAL LOW (ref >90)
Glucose, Bld: 481 mg/dL — ABNORMAL HIGH (ref 70–99)
POTASSIUM: 3.8 meq/L (ref 3.7–5.3)
SODIUM: 138 meq/L (ref 137–147)
TOTAL PROTEIN: 8.1 g/dL (ref 6.0–8.3)
Total Bilirubin: <0.2 mg/dL — ABNORMAL LOW (ref 0.3–1.2)

## 2013-10-15 LAB — CBC WITH DIFFERENTIAL/PLATELET
BASOS ABS: 0 10*3/uL (ref 0.0–0.1)
BASOS PCT: 1 % (ref 0–1)
EOS ABS: 0.2 10*3/uL (ref 0.0–0.7)
Eosinophils Relative: 3 % (ref 0–5)
HCT: 33.9 % — ABNORMAL LOW (ref 36.0–46.0)
Hemoglobin: 10.6 g/dL — ABNORMAL LOW (ref 12.0–15.0)
Lymphocytes Relative: 37 % (ref 12–46)
Lymphs Abs: 2.2 10*3/uL (ref 0.7–4.0)
MCH: 27.1 pg (ref 26.0–34.0)
MCHC: 31.3 g/dL (ref 30.0–36.0)
MCV: 86.7 fL (ref 78.0–100.0)
MONOS PCT: 10 % (ref 3–12)
Monocytes Absolute: 0.6 10*3/uL (ref 0.1–1.0)
NEUTROS ABS: 2.9 10*3/uL (ref 1.7–7.7)
NEUTROS PCT: 49 % (ref 43–77)
PLATELETS: 227 10*3/uL (ref 150–400)
RBC: 3.91 MIL/uL (ref 3.87–5.11)
RDW: 14.3 % (ref 11.5–15.5)
WBC: 5.9 10*3/uL (ref 4.0–10.5)

## 2013-10-15 LAB — I-STAT CHEM 8, ED
BUN: 24 mg/dL — AB (ref 6–23)
CREATININE: 1.9 mg/dL — AB (ref 0.50–1.10)
Calcium, Ion: 1.13 mmol/L (ref 1.13–1.30)
Chloride: 105 mEq/L (ref 96–112)
GLUCOSE: 474 mg/dL — AB (ref 70–99)
HCT: 36 % (ref 36.0–46.0)
Hemoglobin: 12.2 g/dL (ref 12.0–15.0)
Potassium: 3.6 mEq/L — ABNORMAL LOW (ref 3.7–5.3)
SODIUM: 137 meq/L (ref 137–147)
TCO2: 27 mmol/L (ref 0–100)

## 2013-10-15 LAB — CBG MONITORING, ED
GLUCOSE-CAPILLARY: 416 mg/dL — AB (ref 70–99)
GLUCOSE-CAPILLARY: 173 mg/dL — AB (ref 70–99)
Glucose-Capillary: 401 mg/dL — ABNORMAL HIGH (ref 70–99)
Glucose-Capillary: 386 mg/dL — ABNORMAL HIGH (ref 70–99)

## 2013-10-15 MED ORDER — OXYCODONE-ACETAMINOPHEN 5-325 MG PO TABS: 1.0000 | ORAL_TABLET | Freq: Once | ORAL | Status: DC

## 2013-10-15 MED ORDER — SODIUM CHLORIDE 0.9 % IV BOLUS (SEPSIS)
1000.0000 mL | Freq: Once | INTRAVENOUS | Status: AC
  Administered 2013-10-15: 1000 mL via INTRAVENOUS

## 2013-10-15 MED ORDER — TRAMADOL HCL 50 MG PO TABS
50.0000 mg | ORAL_TABLET | Freq: Once | ORAL | Status: AC
  Administered 2013-10-15: 50 mg via ORAL
  Filled 2013-10-15: qty 1

## 2013-10-15 MED ORDER — HYDROMORPHONE HCL PF 1 MG/ML IJ SOLN: 1.0000 mg | Freq: Once | INTRAMUSCULAR | Status: DC

## 2013-10-15 MED ORDER — HYDROMORPHONE HCL PF 1 MG/ML IJ SOLN
1.0000 mg | Freq: Once | INTRAMUSCULAR | Status: AC
  Administered 2013-10-15: 1 mg via INTRAVENOUS
  Filled 2013-10-15: qty 1

## 2013-10-15 MED ORDER — INSULIN ASPART 100 UNIT/ML ~~LOC~~ SOLN
5.0000 [IU] | Freq: Once | SUBCUTANEOUS | Status: AC
  Administered 2013-10-15: 5 [IU] via INTRAVENOUS
  Filled 2013-10-15: qty 1

## 2013-10-15 MED ORDER — INSULIN ASPART 100 UNIT/ML ~~LOC~~ SOLN
10.0000 [IU] | Freq: Once | SUBCUTANEOUS | Status: AC
  Administered 2013-10-15: 10 [IU] via SUBCUTANEOUS
  Filled 2013-10-15: qty 1

## 2013-10-15 MED ORDER — HYDROMORPHONE HCL PF 1 MG/ML IJ SOLN
0.5000 mg | Freq: Once | INTRAMUSCULAR | Status: AC
  Administered 2013-10-15: 0.5 mg via INTRAVENOUS

## 2013-10-15 MED ORDER — HYDROMORPHONE HCL PF 1 MG/ML IJ SOLN
1.0000 mg | Freq: Once | INTRAMUSCULAR | Status: DC
  Filled 2013-10-15: qty 1

## 2013-10-15 NOTE — ED Notes (Addendum)
Pt from [facility]. Pt has an right leg amputation. Pt having fathom pains since this morning. Pt given medications at [facility] with no relief.

## 2013-10-15 NOTE — ED Provider Notes (Signed)
10:17 AM: BS decreasing appropriately. Pt continues to appear well.  I have discussed the diagnosis/risks/treatment options with the patient and believe the pt to be eligible for discharge home to follow-up with pcp as needed. We also discussed returning to the ED immediately if new or worsening sx occur. We discussed the sx which are most concerning (e.g., worsening pain, fever) that necessitate immediate return. Medications administered to the patient during their visit and any new prescriptions provided to the patient are listed below.  Medications given during this visit Medications  traMADol (ULTRAM) tablet 50 mg (50 mg Oral Given 10/15/13 0535)  sodium chloride 0.9 % bolus 1,000 mL (0 mLs Intravenous Stopped 10/15/13 0814)  HYDROmorphone (DILAUDID) injection 1 mg (1 mg Intravenous Given 10/15/13 0630)  insulin aspart (novoLOG) injection 10 Units (10 Units Subcutaneous Given 10/15/13 0631)  sodium chloride 0.9 % bolus 1,000 mL (1,000 mLs Intravenous New Bag/Given 10/15/13 0814)  insulin aspart (novoLOG) injection 5 Units (5 Units Intravenous Given 10/15/13 0902)  HYDROmorphone (DILAUDID) injection 0.5 mg (0.5 mg Intravenous Given 10/15/13 0903)    Discharge Medication List as of 10/15/2013  7:13 AM     Clinical Impression 1. Leg pain, right      Junius ArgyleForrest S Esten Dollar, MD 10/15/13 2120

## 2013-10-15 NOTE — ED Notes (Signed)
CBG  401 

## 2013-10-15 NOTE — ED Notes (Signed)
Pt sleeping in hallway. 

## 2013-10-15 NOTE — Discharge Instructions (Signed)
Continue using fentanyl patch and oxycodone as needed for pain.   Your blood sugar is elevated. You need to use your insulin as prescribed and mention it to your doctor.   Follow up with your doctor in several days.   Return to ER if you have severe pain, vomiting, abdominal pain.

## 2013-10-15 NOTE — ED Provider Notes (Signed)
CSN: [id number]     Arrival date & time 10/15/13  [id number] History   First MD Initiated Contact with Patient 10/15/13 0524     Chief Complaint  Patient presents with  . Leg Pain     (Consider location/radiation/quality/duration/timing/severity/associated sxs/prior Treatment) The history is provided by the patient.  Amado Nashatricia Gallaher is a 68 y.o. female hx of DM, PVD, R AKA here with R leg pain. She has R leg phantom pain periodically. It happened this morning. Denies fall or injury. Denies fever or chills. Denies back pain. Denies worsening leg swelling. Patient is on fentanyl patch and has oxycodone ordered but hasn't been requiring pain meds.    Past Medical History  Diagnosis Date  . 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 Diabetes mellitus     Prone to hypoglycemia  . Aphagia     Status post numerous  . Dysphagia   . Peripheral vascular disease, unspecified   . Arterial disease   . Depressive disorder   . Hypothyroid   . Seizures   . MRSA infection     History of MRSA in the left leg wound and sacrum in the past-has been seen by vascular surgery and declined any type of intervention including arteriogram  . Diverticulosis     GI bleed in 2007-s/p endoscopy and colonoscopy in 2007 s/p massive GIB consittent with Diveerticulosis  . DVT (deep venous thrombosis)     Not on Coumadin  . Anemia     Chronic  . Non-smoker   . Stroke   . Hypertension   . History of DVT (deep vein thrombosis) 03/06/2011  . Nursing home acquired MRSA infection 03/06/2011    History of MRSA in the left leg wound and sacrum in the past-has been seen by vascular surgery and declined any type of intervention including arteriogram   . SEIZURE DISORDER 12/17/2006    Qualifier: Diagnosis of  By: Clent RidgesFry MD, Tera MaterStephen A    Past Surgical History  Procedure Laterality Date  . Above  knee leg amputation    . Femoral-popliteal bypass graft     History reviewed. No pertinent family history. History  Substance Use Topics  . Smoking status: Never Smoker   . Smokeless tobacco: Not on file  . Alcohol Use: No   OB History   Grav Para Term Preterm Abortions TAB SAB Ect Mult Living                 Review of Systems  Musculoskeletal:       R leg pain   All other systems reviewed and are negative.     Allergies  Review of patient's allergies indicates no known allergies.  Home Medications   Prior to Admission medications   Medication Sig Start Date End Date Taking? Authorizing Noelly Lasseigne  acetaminophen (TYLENOL) 325 MG tablet Take 650 mg by mouth every 6 (six) hours as needed for pain.    Historical Aspynn Clover, MD  aspirin 81 MG tablet Take 81 mg by mouth daily.    Historical Lev Cervone, MD  cloNIDine (CATAPRES - DOSED IN MG/24 HR) 0.2 mg/24hr patch Place 1 patch onto the skin once a week.    Historical Elis Sauber, MD  fentaNYL (DURAGESIC - DOSED MCG/HR) 50 MCG/HR Remove old patch and then apply one patch topically every 72 hours rotate sites; nurses must check placement every shift  and notify DON if missing 09/25/13   Roena MaladyArlo C Lassen, PA-C  fish oil-omega-3 fatty acids 1000 MG capsule Take 1 g by mouth daily.    Historical Jeffree Cazeau, MD  furosemide (LASIX) 20 MG tablet Take 40 mg by mouth daily. 02/12/12   Rodolph Bonganiel Thompson V, MD  gabapentin (NEURONTIN) 300 MG capsule Take 600 mg by mouth 3 (three) times daily.     Historical Rainer Mounce, MD  gemfibrozil (LOPID) 600 MG tablet Take 600 mg by mouth 2 (two) times daily before a meal.    Historical Jasara Corrigan, MD  insulin aspart (NOVOLOG) 100 UNIT/ML injection Inject 7-10 Units into the skin 3 (three) times daily before meals. Give 10 units units with breakfast and lunch and 7 units for dinner    Historical Atalie Oros, MD  insulin glargine (LANTUS) 100 UNIT/ML injection Inject 66 Units into the skin daily.     Historical Analeia Ismael, MD   levothyroxine (SYNTHROID, LEVOTHROID) 88 MCG tablet Take 88 mcg by mouth daily before breakfast.    Historical Candiace West, MD  linagliptin (TRADJENTA) 5 MG TABS tablet Take 5 mg by mouth daily.    Historical Raegan Winders, MD  LORazepam (ATIVAN) 0.5 MG tablet Take one tablet by mouth every 8 hours as needed 03/17/13   Sharee Holstereborah S Green, NP  losartan (COZAAR) 25 MG tablet Take 25 mg by mouth 2 (two) times daily.    Historical Branko Steeves, MD  metoprolol tartrate (LOPRESSOR) 25 MG tablet Take 25 mg by mouth 2 (two) times daily. 03/08/11   Russella DarAllison L Ellis, NP  mirabegron ER (MYRBETRIQ) 50 MG TB24 tablet Take 50 mg by mouth daily.    Historical Sydell Prowell, MD  nitroGLYCERIN (NITROSTAT) 0.4 MG SL tablet Place 0.4 mg under the tongue every 5 (five) minutes as needed. For chest pain     Historical Nora Rooke, MD  oxyCODONE (OXY IR/ROXICODONE) 5 MG immediate release tablet Take two tablets by mouth every four hours as needed for pain 08/21/13   Oneal GroutMahima Pandey, MD  polyethylene glycol (MIRALAX / GLYCOLAX) packet Take 17 g by mouth daily. Mix in 8 ounces of water; for constipation     Historical Maiya Kates, MD  rosuvastatin (CRESTOR) 40 MG tablet Take 40 mg by mouth daily.    Historical Nella Botsford, MD  senna (SENOKOT) 8.6 MG TABS Take 2 tablets by mouth daily.      Historical Harrietta Incorvaia, MD  sodium chloride (OCEAN) 0.65 % nasal spray Place 1 spray into the nose 2 (two) times daily as needed for congestion.    Historical Ayrianna Mcginniss, MD  zolpidem (AMBIEN) 5 MG tablet 1/2 by mouth at bedtime as needed for Insomnia 01/20/13   Claudie ReveringJessica M Karam, NP   BP 148/80  Pulse 95  Temp(Src) 98.7 F (37.1 C) (Oral)  Resp 16  SpO2 92% Physical Exam  Nursing note and vitals reviewed. Constitutional: She is oriented to person, place, and time.  Chronically ill   HENT:  Head: Normocephalic.  Mouth/Throat: Oropharynx is clear and moist.  Eyes: Conjunctivae are normal. Pupils are equal, round, and reactive to light.  Neck: Normal range of motion.  Neck supple.  Cardiovascular: Normal rate, regular rhythm and normal heart sounds.   Pulmonary/Chest: Effort normal and breath sounds normal. No respiratory distress. She has no wheezes. She has no rales.  Abdominal: Soft. Bowel sounds are normal. She exhibits no distension. There is no tenderness. There is no rebound.  Musculoskeletal:  R AKA. No signs of infection. L leg nl.   Neurological: She is alert and oriented  to person, place, and time. No cranial nerve deficit. Coordination normal.  Skin: Skin is warm and dry.  Psychiatric: She has a normal mood and affect. Her behavior is normal. Judgment and thought content normal.    ED Course  Procedures (including critical care time) Labs Review Labs Reviewed  CBC WITH DIFFERENTIAL - Abnormal; Notable for the following:    Hemoglobin 10.6 (*)    HCT 33.9 (*)    All other components within normal limits  COMPREHENSIVE METABOLIC PANEL - Abnormal; Notable for the following:    Glucose, Bld 481 (*)    BUN 24 (*)    Creatinine, Ser 1.86 (*)    Albumin 3.2 (*)    AST 52 (*)    Alkaline Phosphatase 180 (*)    Total Bilirubin <0.2 (*)    GFR calc non Af Amer 27 (*)    GFR calc Af Amer 32 (*)    All other components within normal limits  CBG MONITORING, ED - Abnormal; Notable for the following:    Glucose-Capillary 416 (*)    All other components within normal limits  I-STAT CHEM 8, ED - Abnormal; Notable for the following:    Potassium 3.6 (*)    BUN 24 (*)    Creatinine, Ser 1.90 (*)    Glucose, Bld 474 (*)    All other components within normal limits  CBG MONITORING, ED    Imaging Review No results found.   EKG Interpretation None      MDM   Final diagnoses:  Leg pain, right    Shamiyah Ngu is a 68 y.o. female here with phantom leg pain. Since she is diabetic, will get CBG. Will give pain meds and reassess.   6:20 AM  CBG 400. Will get labs, give insulin. No vomiting or abdominal pain to suggest DKA. Likely  poorly controlled DM causing worsening parethesias.   7:43 AM AG 14. Slightly dehydrated. Will hydrate. Given sub Q insulin. Doubt DKA. Signed out to Dr. Romeo Apple to reassess patient and recheck CBG.   Richardean Canal, MD 10/15/13 [phone]

## 2013-10-16 ENCOUNTER — Non-Acute Institutional Stay (SKILLED_NURSING_FACILITY): Payer: Medicare Other | Admitting: Nurse Practitioner

## 2013-10-16 DIAGNOSIS — G629 Polyneuropathy, unspecified: Secondary | ICD-10-CM

## 2013-10-16 DIAGNOSIS — E1149 Type 2 diabetes mellitus with other diabetic neurological complication: Secondary | ICD-10-CM

## 2013-10-16 DIAGNOSIS — I10 Essential (primary) hypertension: Secondary | ICD-10-CM

## 2013-10-16 DIAGNOSIS — E039 Hypothyroidism, unspecified: Secondary | ICD-10-CM

## 2013-10-16 DIAGNOSIS — IMO0002 Reserved for concepts with insufficient information to code with codable children: Secondary | ICD-10-CM

## 2013-10-16 DIAGNOSIS — G589 Mononeuropathy, unspecified: Secondary | ICD-10-CM

## 2013-10-16 DIAGNOSIS — R32 Unspecified urinary incontinence: Secondary | ICD-10-CM

## 2013-10-16 DIAGNOSIS — E1165 Type 2 diabetes mellitus with hyperglycemia: Secondary | ICD-10-CM

## 2013-10-16 DIAGNOSIS — I509 Heart failure, unspecified: Secondary | ICD-10-CM

## 2013-10-16 NOTE — Progress Notes (Signed)
Patient ID: Allison Washington, female   DOB: [birth year], 68 y.o.   MRN: [id number]    Nursing Home Location:  [facility]   Place of Service: SNF (31)  PCP: Kimber RelicGREEN, ARTHUR G, MD  No Known Allergies  Chief Complaint  Patient presents with  . Medical Management of Chronic Issues    HPI:  This patient is a 68 year old female who has an extensive history of uncontrolled diabetes with complication, right AKA, hypertension, CHF, CVA, aphasia, hypothyroidism, neuropathy, and renal insufficiency. Pt went to the ED 2 days ago due to increased phantom pain. Pt reports she was having it worse at night but now it is all the time and she can not tolerate it. Reports a shooting pain in her leg that is not there, she states she feels as if she has a foot but does not. Otherwise has been doing well. No chest pains, shortness of breath, fevers or chills. Urinary symptoms have been stable and no reports of constipation. Mood has been stable.   Review of Systems:  Review of Systems  Constitutional: Negative for weight loss and malaise/fatigue.  Eyes: Negative for blurred vision.  Respiratory: Negative for cough, sputum production and shortness of breath.   Cardiovascular: Negative for chest pain, palpitations and leg swelling.  Gastrointestinal: Negative for heartburn, abdominal pain, diarrhea and constipation.  Genitourinary: Negative for dysuria, urgency and frequency.  Musculoskeletal: Negative for falls, joint pain and myalgias.  Skin: Negative for itching and rash.  Neurological: Positive for tingling and sensory change. Negative for dizziness, weakness and headaches.  Psychiatric/Behavioral: Negative for depression and memory loss. The patient is not nervous/anxious and does not have insomnia.      Past Medical History  Diagnosis Date  . Hemiplegia and hemiparesis     5x cva's known history of bilateral  subcortical strokes and pseudobulbar, MRI with acute infarct of the left  basal ganglia, posterior limb of  . Diabetes mellitus     Prone to hypoglycemia  . Aphagia     Status post numerous  . Dysphagia   . Peripheral vascular disease, unspecified   . Arterial disease   . Depressive disorder   . Hypothyroid   . Seizures   . MRSA infection     History of MRSA in the left leg wound and sacrum in the past-has been seen by vascular surgery and declined any type of intervention including arteriogram  . Diverticulosis     GI bleed in 2007-s/p endoscopy and colonoscopy in 2007 s/p massive GIB consittent with Diveerticulosis  . DVT (deep venous thrombosis)     Not on Coumadin  . Anemia     Chronic  . Non-smoker   . Stroke   . Hypertension   . History of DVT (deep vein thrombosis) 03/06/2011  . Nursing home acquired MRSA infection 03/06/2011    History of MRSA in the left leg wound and sacrum in the past-has been seen by vascular surgery and declined any type of intervention including arteriogram   . SEIZURE DISORDER 12/17/2006    Qualifier: Diagnosis of  By: Clent RidgesFry MD, Tera MaterStephen A    Past Surgical History  Procedure Laterality Date  . Above knee leg amputation    . Femoral-popliteal bypass graft     Social History:   reports that she has never smoked. She does not have any smokeless tobacco history on file. She reports that she does not drink alcohol or use illicit drugs.  No family history on  file.  Medications: Patient's Medications  New Prescriptions   No medications on file  Previous Medications   ACETAMINOPHEN (TYLENOL) 325 MG TABLET    Take 650 mg by mouth every 6 (six) hours as needed for fever (pain).    AMINO ACIDS-PROTEIN HYDROLYS (FEEDING SUPPLEMENT, PRO-STAT SUGAR FREE 64,) LIQD    Take 30 mLs by mouth every morning.   AMLODIPINE (NORVASC) 10 MG TABLET    Take 10 mg by mouth every morning.   ASPIRIN 81 MG TABLET    Take 324 mg by mouth every morning.    CLONIDINE (CATAPRES - DOSED IN MG/24 HR) 0.2 MG/24HR PATCH    Place 1 patch onto the skin once  a week.   FENTANYL (DURAGESIC - DOSED MCG/HR) 50 MCG/HR    Place 50 mcg onto the skin every 3 (three) days.   FISH OIL-OMEGA-3 FATTY ACIDS 1000 MG CAPSULE    Take 1 g by mouth 2 (two) times daily.    FUROSEMIDE (LASIX) 40 MG TABLET    Take 40 mg by mouth every morning.   GABAPENTIN (NEURONTIN) 600 MG TABLET    Take 600 mg by mouth 3 (three) times daily.   GEMFIBROZIL (LOPID) 600 MG TABLET    Take 600 mg by mouth 2 (two) times daily before a meal.   INSULIN ASPART (NOVOLOG FLEXPEN) 100 UNIT/ML FLEXPEN    Inject 7-10 Units into the skin daily with supper. 10 units with breakfast and lunch, 7 units with supper, hold for CBG < 80   INSULIN GLARGINE (LANTUS) 100 UNIT/ML INJECTION    Inject 66 Units into the skin daily at 12 noon.    LEVOTHYROXINE (SYNTHROID, LEVOTHROID) 88 MCG TABLET    Take 88 mcg by mouth daily. 4 pm   LINAGLIPTIN (TRADJENTA) 5 MG TABS TABLET    Take 5 mg by mouth every morning.    LORAZEPAM (ATIVAN) 0.5 MG TABLET    Take 0.5 mg by mouth every 8 (eight) hours as needed for anxiety.   LOSARTAN (COZAAR) 25 MG TABLET    Take 25 mg by mouth 2 (two) times daily.   METOPROLOL TARTRATE (LOPRESSOR) 25 MG TABLET    Take 25 mg by mouth 2 (two) times daily.   MIRABEGRON ER (MYRBETRIQ) 50 MG TB24 TABLET    Take 50 mg by mouth every morning.    NITROGLYCERIN (NITROSTAT) 0.4 MG SL TABLET    Place 0.4 mg under the tongue every 5 (five) minutes x 3 doses as needed for chest pain. For chest pain   OXYCODONE (OXY IR/ROXICODONE) 5 MG IMMEDIATE RELEASE TABLET    Take 10 mg by mouth every 4 (four) hours as needed (pain).   POLYETHYLENE GLYCOL (MIRALAX / GLYCOLAX) PACKET    Take 17 g by mouth every morning. Mix in 8 ounces of water; for constipation   ROSUVASTATIN (CRESTOR) 40 MG TABLET    Take 40 mg by mouth at bedtime.    SENNOSIDES-DOCUSATE SODIUM (SENOKOT-S) 8.6-50 MG TABLET    Take 2 tablets by mouth every morning.   ZOLPIDEM (AMBIEN) 5 MG TABLET    Take 2.5 mg by mouth at bedtime as needed for  sleep.  Modified Medications   No medications on file  Discontinued Medications   No medications on file     Physical Exam:  Filed Vitals:   10/16/13 1804  BP: 140/70  Pulse: 74  Temp: 97 F (36.1 C)  Resp: 20   Physical Exam  Nursing note and vitals reviewed.  Constitutional: She is oriented to person, place, and time and well-developed, well-nourished, and in no distress.  HENT:  Head: Normocephalic and atraumatic.  Mouth/Throat: Oropharynx is clear and moist.  Eyes: Pupils are equal, round, and reactive to light.  Neck: Normal range of motion. Neck supple.  Cardiovascular: Normal rate, regular rhythm and normal heart sounds.   Pulmonary/Chest: Effort normal and breath sounds normal. No respiratory distress. She has no wheezes.  Abdominal: Soft. Bowel sounds are normal. There is no tenderness.  Musculoskeletal: Normal range of motion. She exhibits no edema and no tenderness.  Right AKA  Neurological: She is alert and oriented to person, place, and time.  Skin: Skin is warm and dry. She is not diaphoretic.  Psychiatric: Mood and affect normal.      Labs reviewed:   Recent Labs  10/15/13 0610 10/15/13 0628  NA 138 137  K 3.8 3.6*  CL 97 105  CO2 27  --   GLUCOSE 481* 474*  BUN 24* 24*  CREATININE 1.86* 1.90*  CALCIUM 9.6  --    Liver Function Tests:  Recent Labs  10/15/13 0610  AST 52*  ALT 23  ALKPHOS 180*  BILITOT <0.2*  PROT 8.1  ALBUMIN 3.2*   No results found for this basename: LIPASE, AMYLASE,  in the last 8760 hours No results found for this basename: AMMONIA,  in the last 8760 hours CBC:  Recent Labs  10/15/13 0610 10/15/13 0628  WBC 5.9  --   NEUTROABS 2.9  --   HGB 10.6* 12.2  HCT 33.9* 36.0  MCV 86.7  --   PLT 227  --    Lipid Profile  Result: 11/20/2012 4:00 PM ( Status: F )  Cholesterol 250 H 0-200 mg/dL SLN C  Triglyceride 409219 H <150 mg/dL SLN  HDL Cholesterol 35 L >39 mg/dL SLN  Total Chol/HDL Ratio 7.1 Ratio SLN    VLDL Cholesterol (Calc) 44 H 0-40 mg/dL SLN  LDL Cholesterol (Calc) 171 H  BMP with Estimated GFR  Result: 12/31/2012 12:07 PM ( Status: F )  Sodium 141 135-145 mEq/L SLN  Potassium 3.8 3.5-5.3 mEq/L SLN  Chloride 103 96-112 mEq/L SLN  CO2 26 19-32 mEq/L SLN  Glucose 173 H 70-99 mg/dL SLN  BUN 14 8-116-23 mg/dL SLN  Creatinine 9.141.46 H 0.50-1.10 mg/dL SLN  Calcium 8.1 L 7.8-29.58.4-10.5 mg/dL SLN  Est GFR, African American 44 L mL/min SLN  Est GFR, NonAfrican American 38 L mL/min SLN C  CBC NO Diff (Complete Blood Count)  Result: 12/31/2012 1:07 PM ( Status: F )  WBC 6.7 4.0-10.5 K/uL SLN  RBC 3.85 L 3.87-5.11 MIL/uL SLN  Hemoglobin 10.4 L 12.0-15.0 g/dL SLN  Hematocrit 62.132.2 L 36.0-46.0 % SLN  MCV 83.6 78.0-100.0 fL SLN  MCH 27.0 26.0-34.0 pg SLN  MCHC 32.3 30.0-36.0 g/dL SLN  RDW 30.813.9 65.7-84.611.5-15.5 % SLN  Platelet Count 284 150-400 K/uL SLN  Lipid Profile  Result: 12/31/2012 12:07 PM ( Status: F )  Cholesterol 206 H 0-200 mg/dL SLN C  Triglyceride 962165 H <150 mg/dL SLN  HDL Cholesterol 36 L >39 mg/dL SLN  Total Chol/HDL Ratio 5.7 Ratio SLN  VLDL Cholesterol (Calc) 33 9-520-40 mg/dL SLN  LDL Cholesterol (Calc) 137 H 0-99 mg/dL SLN C  Hemoglobin [id number]  Result: 12/31/2012 4:29 PM ( Status: F )  Hemoglobin A1C 8.6 H <5.7 % SLN C  Estimated Average Glucose 200 H <117 mg/dL SLN  Lipid Profile  Result: 01/13/2013 3:06 PM ( Status: F )  Cholesterol 204 H 0-200 mg/dL SLN C  Triglyceride 161 <150 mg/dL SLN  HDL Cholesterol 35 L >39 mg/dL SLN  Total Chol/HDL Ratio 5.8 Ratio SLN  VLDL Cholesterol (Calc) 23 0-96 mg/dL SLN  LDL Cholesterol (Calc) 146 H 0-99  CBC NO Diff (Complete Blood Count)  Result: 04/08/2013 12:02 PM ( Status: F )  WBC 7.3 4.0-10.5 K/uL SLN  RBC 3.96 3.87-5.11 MIL/uL SLN  Hemoglobin 10.9 L 12.0-15.0 g/dL SLN  Hematocrit 04.5 L 36.0-46.0 % SLN  MCV 82.8 78.0-100.0 fL SLN  MCH 27.5 26.0-34.0 pg SLN  MCHC 33.2 30.0-36.0 g/dL SLN  RDW 40.9 81.1-91.4 % SLN  Platelet Count 261 150-400 K/uL  SLN  Comprehensive Metabolic Panel  Result: 04/08/2013 1:41 PM ( Status: F )  Sodium 139 135-145 mEq/L SLN  Potassium 3.6 3.5-5.3 mEq/L SLN  Chloride 104 96-112 mEq/L SLN  CO2 28 19-32 mEq/L SLN  Glucose 192 H 70-99 mg/dL SLN  BUN 13 7-82 mg/dL SLN  Creatinine 9.56 H 0.50-1.10 mg/dL SLN  Bilirubin, Total 0.2 L 0.3-1.2 mg/dL SLN  Alkaline Phosphatase 116 39-117 U/L SLN  AST/SGOT 12 0-37 U/L SLN  ALT/SGPT <8 0-35 U/L SLN  Total Protein 6.1 6.0-8.3 g/dL SLN  Albumin 2.9 L 2.1-3.0 g/dL SLN  Calcium 8.5 8.6-57.8 mg/dL SLN  TSH, Ultrasensitive  Result: 04/08/2013 2:17 PM ( Status: F )  TSH 0.070 L 0.350-4.500 uIU/mL SLN  CBC NO Diff (Complete Blood Count)  Result: 07/10/2013 3:12 PM ( Status: F ) C  WBC 6.1 4.0-10.5 K/uL SLN  RBC 3.71 L 3.87-5.11 MIL/uL SLN  Hemoglobin 10.0 L 12.0-15.0 g/dL SLN  Hematocrit 46.9 L 36.0-46.0 % SLN  MCV 81.4 78.0-100.0 fL SLN  MCH 27.0 26.0-34.0 pg SLN  MCHC 33.1 30.0-36.0 g/dL SLN  RDW 62.9 52.8-41.3 % SLN  Platelet Count 294 150-400 K/uL SLN  Lipid Profile  Result: 07/10/2013 3:33 PM ( Status: F )  Cholesterol 186 0-200 mg/dL SLN C  Triglyceride 244 H <150 mg/dL SLN  HDL Cholesterol 32 L >39 mg/dL SLN  Total Chol/HDL Ratio 5.8 Ratio SLN  VLDL Cholesterol (Calc) 38 0-10 mg/dL SLN  LDL Cholesterol (Calc) 116 H 0-99 mg/dL SLN C  TSH, Ultrasensitive  Result: 07/10/2013 4:12 PM ( Status: F )  TSH 0.042 L 0.350-4.500 uIU/mL SLN  Hemoglobin A1C  Result: 07/10/2013 6:00 PM ( Status: F )  Hemoglobin A1C 8.5 H <5.7 % SLN C  Estimated Average Glucose 197 H <117 mg/dL SLN  Basic Metabolic Panel  Result: 09/01/2013 6:05 PM ( Status: F ) C  Sodium 136 135-145 mEq/L SLN  Potassium 3.7 3.5-5.3 mEq/L SLN  Chloride 101 96-112 mEq/L SLN  CO2 19 19-32 mEq/L SLN  Glucose 170 H 70-99 mg/dL SLN  BUN 29 H 2-72 mg/dL SLN  Creatinine 5.36 H 0.50-1.10 mg/dL SLN  Calcium 8.9 6.4-40.3 mg/dL SLN  Lipid Profile  Result: 09/01/2013 6:05 PM ( Status: F )  Cholesterol 196  0-200 mg/dL SLN C  Triglyceride 474 H <150 mg/dL SLN  HDL Cholesterol 37 L >39 mg/dL SLN  Total Chol/HDL Ratio 5.3 Ratio SLN  VLDL Cholesterol (Calc) 43 H 0-40 mg/dL SLN  LDL Cholesterol (Calc) 116 H 0-99 mg/dL SLN C  Liver Profile  Result: 09/01/2013 6:05 PM ( Status: F )  Bilirubin, Total 0.3 0.2-1.2 mg/dL SLN  Bilirubin, Direct <0.1 0.0-0.3 mg/dL SLN  Indirect Bilirubin NOT CALC 0.2-1.2 mg/dL SLN  Alkaline Phosphatase 132 H 39-117 U/L SLN  AST/SGOT 21 0-37 U/L SLN  ALT/SGPT 10  0-35 U/L SLN  Total Protein 6.8 6.0-8.3 g/dL SLN  Albumin 3.4 L 8.4-1.3 g/dL SLN  Creatinine  Result: 09/01/2013 6:05 PM ( Status: F )  Creatinine 2.63 H 0.50-1.10 mg/dL SLN  TSH, Ultrasensitive  Result: 09/01/2013 4:07 PM ( Status: F )  TSH 0.360 0.350-4.500 uIU/mL SLN  Hemoglobin A1C  Result: 09/01/2013 4:23 PM ( Status: F )  Hemoglobin A1C 9.3 H <5.7 % SLN C  Estimated Average Glucose 220 H  Lipid Profile    Result: 10/07/2013 11:34 AM   ( Status: F )     C Cholesterol 191     0-200 mg/dL SLN C Triglyceride 244     <150 mg/dL SLN   HDL Cholesterol 36   L >39 mg/dL SLN   Total Chol/HDL Ratio 5.3      Ratio SLN   VLDL Cholesterol (Calc) 30     0-40 mg/dL SLN   LDL Cholesterol (Calc) 125   H 0-99 mg/dL SLN C Liver Profile    Result: 10/07/2013 11:34 AM   ( Status: F )       Bilirubin, Total 0.2     0.2-1.2 mg/dL SLN   Bilirubin, Direct <0.1     0.0-0.3 mg/dL SLN   Indirect Bilirubin NOT CALC     0.2-1.2 mg/dL SLN   Alkaline Phosphatase 97     39-117 U/L SLN   AST/SGOT 24     0-37 U/L SLN   ALT/SGPT 10     0-35 U/L SLN   Total Protein 6.4     6.0-8.3 g/dL SLN   Albumin 3.2   L 0.1-0.2 g/dL SLN   Creatinine    Result: 10/07/2013 11:34 AM   ( Status: F )       Creatinine 1.86   H 0.50-1.10 mg/dL SLN   TSH, Ultrasensitive    Result: 10/07/2013 11:06 AM   ( Status: F )       TSH 1.241     0.350-4.500 uIU/mL SLN   Hemoglobin A1C    Result: 10/07/2013 4:51 PM   ( Status: F )       Hemoglobin A1C 9.8    H <5.7 % SLN C Estimated Average Glucose 235   H <117 mg/dL SLN   Assessment/Plan 1. Chronic congestive heart failure, unspecified congestive heart failure type Has remained stable on current lasix  2. HYPERTENSION -overall as been stable with current medications  3. Diabetes mellitus with neurological manifestations, uncontrolled -at times will cont to refuse insulin but has showed some better compliance  4. Unspecified hypothyroidism -conts on synthroid 88 mcg  5. Neuropathy -worse, with recent trip to the ED -will stop gabapentin and start lyrica 50 mg TID at this time   6. URINARY INCONTINENCE -has been stable on myrbetriq 50 mg

## 2013-10-27 ENCOUNTER — Other Ambulatory Visit: Payer: Self-pay | Admitting: *Deleted

## 2013-10-27 MED ORDER — FENTANYL 50 MCG/HR TD PT72: 50.0000 ug | MEDICATED_PATCH | TRANSDERMAL | Status: DC

## 2013-10-27 NOTE — Telephone Encounter (Signed)
[facility]  

## 2013-11-13 ENCOUNTER — Non-Acute Institutional Stay (SKILLED_NURSING_FACILITY): Payer: Medicare Other | Admitting: Nurse Practitioner

## 2013-11-13 DIAGNOSIS — E785 Hyperlipidemia, unspecified: Secondary | ICD-10-CM

## 2013-11-13 DIAGNOSIS — E1149 Type 2 diabetes mellitus with other diabetic neurological complication: Secondary | ICD-10-CM

## 2013-11-13 DIAGNOSIS — G629 Polyneuropathy, unspecified: Secondary | ICD-10-CM

## 2013-11-13 DIAGNOSIS — R32 Unspecified urinary incontinence: Secondary | ICD-10-CM

## 2013-11-13 DIAGNOSIS — I10 Essential (primary) hypertension: Secondary | ICD-10-CM

## 2013-11-13 DIAGNOSIS — G589 Mononeuropathy, unspecified: Secondary | ICD-10-CM

## 2013-11-13 DIAGNOSIS — IMO0002 Reserved for concepts with insufficient information to code with codable children: Secondary | ICD-10-CM

## 2013-11-13 DIAGNOSIS — E039 Hypothyroidism, unspecified: Secondary | ICD-10-CM

## 2013-11-13 DIAGNOSIS — E1165 Type 2 diabetes mellitus with hyperglycemia: Secondary | ICD-10-CM

## 2013-11-13 NOTE — Progress Notes (Signed)
Patient ID: Allison Washington, female   DOB: July 31, 1945, 68 y.o.   MRN: [id number]    Nursing Home Location:  [facility]   Place of Service: SNF (31)  PCP: Kimber Relic, MD  No Known Allergies  Chief Complaint  Patient presents with  . Medical Management of Chronic Issues    HPI:  This patient is a 68 year old female who has an extensive history of uncontrolled diabetes with renal complication, right AKA with phantom pain, hypertension, CHF, CVA, aphasia, hypothyroidism, neuropathy, and renal insufficiency. Pt was seen last month and reported worsening phantom pains, was changed to lyrica from gabapentin and reports pain is much better. Pt still conts to decline insulin at times and be non-complaint with diet. In the last month pt has been stable without any acute medical changes or concerns.   Review of Systems:  Review of Systems  Constitutional: Negative for fever, chills, weight loss and malaise/fatigue.  Eyes: Negative for blurred vision.  Respiratory: Negative for cough, sputum production and shortness of breath.   Cardiovascular: Negative for chest pain, palpitations and leg swelling.  Gastrointestinal: Negative for heartburn, abdominal pain, diarrhea and constipation.  Genitourinary: Negative for dysuria, urgency and frequency.  Musculoskeletal: Negative for falls, joint pain and myalgias.  Skin: Negative for itching and rash.  Neurological: Positive for tingling and sensory change. Negative for dizziness, weakness and headaches.       Tingling and phantom pain to right lower ext  Psychiatric/Behavioral: Negative for depression and memory loss. The patient is not nervous/anxious and does not have insomnia.      Past Medical History  Diagnosis Date  . 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 Diabetes mellitus     Prone  to hypoglycemia  . Aphagia     Status post numerous  . Dysphagia   . Peripheral vascular disease, unspecified   . Arterial disease   . Depressive disorder   . Hypothyroid   . Seizures   . MRSA infection     History of MRSA in the left leg wound and sacrum in the past-has been seen by vascular surgery and declined any type of intervention including arteriogram  . Diverticulosis     GI bleed in 2007-s/p endoscopy and colonoscopy in 2007 s/p massive GIB consittent with Diveerticulosis  . DVT (deep venous thrombosis)     Not on Coumadin  . Anemia     Chronic  . Non-smoker   . Stroke   . Hypertension   . History of DVT (deep vein thrombosis) 03/06/2011  . Nursing home acquired MRSA infection 03/06/2011    History of MRSA in the left leg wound and sacrum in the past-has been seen by vascular surgery and declined any type of intervention including arteriogram   . SEIZURE DISORDER 12/17/2006    Qualifier: Diagnosis of  By: Clent Ridges MD, Tera Mater    Past Surgical History  Procedure Laterality Date  . Above knee leg amputation    . Femoral-popliteal bypass graft     Social History:   reports that she has never smoked. She does not have any smokeless tobacco history on file. She reports that she does not drink alcohol or use illicit drugs.  No family history on file.  Medications: Patient's Medications  New Prescriptions   No medications on file  Previous Medications  ACETAMINOPHEN (TYLENOL) 325 MG TABLET    Take 650 mg by mouth every 6 (six) hours as needed for fever (pain).    AMINO ACIDS-PROTEIN HYDROLYS (FEEDING SUPPLEMENT, PRO-STAT SUGAR FREE 64,) LIQD    Take 30 mLs by mouth every morning.   AMLODIPINE (NORVASC) 10 MG TABLET    Take 10 mg by mouth every morning.   ASPIRIN 81 MG TABLET    Take 324 mg by mouth every morning.    CLONIDINE (CATAPRES - DOSED IN MG/24 HR) 0.2 MG/24HR PATCH    Place 1 patch onto the skin once a week.   FENTANYL (DURAGESIC - DOSED MCG/HR) 50 MCG/HR    Place  1 patch (50 mcg total) onto the skin every 3 (three) days.   FISH OIL-OMEGA-3 FATTY ACIDS 1000 MG CAPSULE    Take 1 g by mouth 2 (two) times daily.    FUROSEMIDE (LASIX) 40 MG TABLET    Take 40 mg by mouth every morning.   GEMFIBROZIL (LOPID) 600 MG TABLET    Take 600 mg by mouth 2 (two) times daily before a meal.   INSULIN ASPART (NOVOLOG FLEXPEN) 100 UNIT/ML FLEXPEN    Inject 7-10 Units into the skin daily with supper. 10 units with breakfast and lunch, 7 units with supper, hold for CBG < 80   INSULIN GLARGINE (LANTUS) 100 UNIT/ML INJECTION    Inject 66 Units into the skin daily at 12 noon.    LEVOTHYROXINE (SYNTHROID, LEVOTHROID) 88 MCG TABLET    Take 88 mcg by mouth daily. 4 pm   LINAGLIPTIN (TRADJENTA) 5 MG TABS TABLET    Take 5 mg by mouth every morning.    LORAZEPAM (ATIVAN) 0.5 MG TABLET    Take 0.5 mg by mouth every 8 (eight) hours as needed for anxiety.   LOSARTAN (COZAAR) 25 MG TABLET    Take 25 mg by mouth 2 (two) times daily.   METOPROLOL TARTRATE (LOPRESSOR) 25 MG TABLET    Take 25 mg by mouth 2 (two) times daily.   MIRABEGRON ER (MYRBETRIQ) 50 MG TB24 TABLET    Take 50 mg by mouth every morning.    NITROGLYCERIN (NITROSTAT) 0.4 MG SL TABLET    Place 0.4 mg under the tongue every 5 (five) minutes x 3 doses as needed for chest pain. For chest pain   OXYCODONE (OXY IR/ROXICODONE) 5 MG IMMEDIATE RELEASE TABLET    Take 10 mg by mouth every 4 (four) hours as needed (pain).   POLYETHYLENE GLYCOL (MIRALAX / GLYCOLAX) PACKET    Take 17 g by mouth every morning. Mix in 8 ounces of water; for constipation   PREGABALIN (LYRICA) 50 MG CAPSULE    Take 50 mg by mouth 3 (three) times daily.   ROSUVASTATIN (CRESTOR) 40 MG TABLET    Take 40 mg by mouth at bedtime.    SENNOSIDES-DOCUSATE SODIUM (SENOKOT-S) 8.6-50 MG TABLET    Take 2 tablets by mouth every morning.   ZOLPIDEM (AMBIEN) 5 MG TABLET    Take 2.5 mg by mouth at bedtime as needed for sleep.  Modified Medications   No medications on file    Discontinued Medications   GABAPENTIN (NEURONTIN) 600 MG TABLET    Take 600 mg by mouth 3 (three) times daily.     Physical Exam:  Filed Vitals:   11/13/13 1736  BP: 139/79  Pulse: 80  Temp: 98 F (36.7 C)  Resp: 20   Physical Exam  Nursing note and vitals reviewed. Constitutional: She  is oriented to person, place, and time and well-developed, well-nourished, and in no distress.  HENT:  Head: Normocephalic and atraumatic.  Mouth/Throat: Oropharynx is clear and moist.  Eyes: Pupils are equal, round, and reactive to light.  Neck: Normal range of motion. Neck supple.  Cardiovascular: Normal rate, regular rhythm and normal heart sounds.   Pulmonary/Chest: Effort normal and breath sounds normal. No respiratory distress. She has no wheezes.  Abdominal: Soft. Bowel sounds are normal. There is no tenderness.  Musculoskeletal: Normal range of motion. She exhibits no edema and no tenderness.  Right AKA  Neurological: She is alert and oriented to person, place, and time.  Skin: Skin is warm and dry. She is not diaphoretic.  Psychiatric: Mood and affect normal.      Labs reviewed: Basic Metabolic Panel:  Recent Labs  16/01/9605/18/15 0610 10/15/13 0628  NA 138 137  K 3.8 3.6*  CL 97 105  CO2 27  --   GLUCOSE 481* 474*  BUN 24* 24*  CREATININE 1.86* 1.90*  CALCIUM 9.6  --    Liver Function Tests:  Recent Labs  10/15/13 0610  AST 52*  ALT 23  ALKPHOS 180*  BILITOT <0.2*  PROT 8.1  ALBUMIN 3.2*   No results found for this basename: LIPASE, AMYLASE,  in the last 8760 hours No results found for this basename: AMMONIA,  in the last 8760 hours CBC:  Recent Labs  10/15/13 0610 10/15/13 0628  WBC 5.9  --   NEUTROABS 2.9  --   HGB 10.6* 12.2  HCT 33.9* 36.0  MCV 86.7  --   PLT 227  --    Cardiac Enzymes: No results found for this basename: CKTOTAL, CKMB, CKMBINDEX, TROPONINI,  in the last 8760 hours BNP: No components found with this basename: POCBNP,   CBG:  Recent Labs  10/15/13 0750 10/15/13 0900 10/15/13 1015  GLUCAP 401* 386* 173*   TSH: No results found for this basename: TSH,  in the last 8760 hours A1C: Lab Results  Component Value Date   HGBA1C 9.4* 02/11/2012    Lipid Profile  Result: 11/20/2012 4:00 PM ( Status: F )  Cholesterol 250 H 0-200 mg/dL SLN C  Triglyceride 045219 H <150 mg/dL SLN  HDL Cholesterol 35 L >39 mg/dL SLN  Total Chol/HDL Ratio 7.1 Ratio SLN  VLDL Cholesterol (Calc) 44 H 0-40 mg/dL SLN  LDL Cholesterol (Calc) 171 H  BMP with Estimated GFR  Result: 12/31/2012 12:07 PM ( Status: F )  Sodium 141 135-145 mEq/L SLN  Potassium 3.8 3.5-5.3 mEq/L SLN  Chloride 103 96-112 mEq/L SLN  CO2 26 19-32 mEq/L SLN  Glucose 173 H 70-99 mg/dL SLN  BUN 14 4-096-23 mg/dL SLN  Creatinine 8.111.46 H 0.50-1.10 mg/dL SLN  Calcium 8.1 L 9.1-47.88.4-10.5 mg/dL SLN  Est GFR, African American 44 L mL/min SLN  Est GFR, NonAfrican American 38 L mL/min SLN C  CBC NO Diff (Complete Blood Count)  Result: 12/31/2012 1:07 PM ( Status: F )  WBC 6.7 4.0-10.5 K/uL SLN  RBC 3.85 L 3.87-5.11 MIL/uL SLN  Hemoglobin 10.4 L 12.0-15.0 g/dL SLN  Hematocrit 29.532.2 L 36.0-46.0 % SLN  MCV 83.6 78.0-100.0 fL SLN  MCH 27.0 26.0-34.0 pg SLN  MCHC 32.3 30.0-36.0 g/dL SLN  RDW 62.113.9 30.8-65.711.5-15.5 % SLN  Platelet Count 284 150-400 K/uL SLN  Lipid Profile  Result: 12/31/2012 12:07 PM ( Status: F )  Cholesterol 206 H 0-200 mg/dL SLN C  Triglyceride 846165 H <150 mg/dL SLN  HDL Cholesterol 36 L >39 mg/dL SLN  Total Chol/HDL Ratio 5.7 Ratio SLN  VLDL Cholesterol (Calc) 33 2-95 mg/dL SLN  LDL Cholesterol (Calc) 137 H 0-99 mg/dL SLN C  Hemoglobin A2Z  Result: 12/31/2012 4:29 PM ( Status: F )  Hemoglobin A1C 8.6 H <5.7 % SLN C  Estimated Average Glucose 200 H <117 mg/dL SLN  Lipid Profile  Result: 01/13/2013 3:06 PM ( Status: F )  Cholesterol 204 H 0-200 mg/dL SLN C  Triglyceride 308 <150 mg/dL SLN  HDL Cholesterol 35 L >39 mg/dL SLN  Total Chol/HDL Ratio 5.8 Ratio SLN   VLDL Cholesterol (Calc) 23 6-57 mg/dL SLN  LDL Cholesterol (Calc) 146 H 0-99  CBC NO Diff (Complete Blood Count)  Result: 04/08/2013 12:02 PM ( Status: F )  WBC 7.3 4.0-10.5 K/uL SLN  RBC 3.96 3.87-5.11 MIL/uL SLN  Hemoglobin 10.9 L 12.0-15.0 g/dL SLN  Hematocrit 84.6 L 36.0-46.0 % SLN  MCV 82.8 78.0-100.0 fL SLN  MCH 27.5 26.0-34.0 pg SLN  MCHC 33.2 30.0-36.0 g/dL SLN  RDW 96.2 95.2-84.1 % SLN  Platelet Count 261 150-400 K/uL SLN  Comprehensive Metabolic Panel  Result: 04/08/2013 1:41 PM ( Status: F )  Sodium 139 135-145 mEq/L SLN  Potassium 3.6 3.5-5.3 mEq/L SLN  Chloride 104 96-112 mEq/L SLN  CO2 28 19-32 mEq/L SLN  Glucose 192 H 70-99 mg/dL SLN  BUN 13 3-24 mg/dL SLN  Creatinine 4.01 H 0.50-1.10 mg/dL SLN  Bilirubin, Total 0.2 L 0.3-1.2 mg/dL SLN  Alkaline Phosphatase 116 39-117 U/L SLN  AST/SGOT 12 0-37 U/L SLN  ALT/SGPT <8 0-35 U/L SLN  Total Protein 6.1 6.0-8.3 g/dL SLN  Albumin 2.9 L 0.2-7.2 g/dL SLN  Calcium 8.5 5.3-66.4 mg/dL SLN  TSH, Ultrasensitive  Result: 04/08/2013 2:17 PM ( Status: F )  TSH 0.070 L 0.350-4.500 uIU/mL SLN  CBC NO Diff (Complete Blood Count)  Result: 07/10/2013 3:12 PM ( Status: F ) C  WBC 6.1 4.0-10.5 K/uL SLN  RBC 3.71 L 3.87-5.11 MIL/uL SLN  Hemoglobin 10.0 L 12.0-15.0 g/dL SLN  Hematocrit 40.3 L 36.0-46.0 % SLN  MCV 81.4 78.0-100.0 fL SLN  MCH 27.0 26.0-34.0 pg SLN  MCHC 33.1 30.0-36.0 g/dL SLN  RDW 47.4 25.9-56.3 % SLN  Platelet Count 294 150-400 K/uL SLN  Lipid Profile  Result: 07/10/2013 3:33 PM ( Status: F )  Cholesterol 186 0-200 mg/dL SLN C  Triglyceride 875 H <150 mg/dL SLN  HDL Cholesterol 32 L >39 mg/dL SLN  Total Chol/HDL Ratio 5.8 Ratio SLN  VLDL Cholesterol (Calc) 38 6-43 mg/dL SLN  LDL Cholesterol (Calc) 116 H 0-99 mg/dL SLN C  TSH, Ultrasensitive  Result: 07/10/2013 4:12 PM ( Status: F )  TSH 0.042 L 0.350-4.500 uIU/mL SLN  Hemoglobin A1C  Result: 07/10/2013 6:00 PM ( Status: F )  Hemoglobin A1C 8.5 H <5.7 % SLN C   Estimated Average Glucose 197 H <117 mg/dL SLN  Basic Metabolic Panel  Result: 09/01/2013 6:05 PM ( Status: F ) C  Sodium 136 135-145 mEq/L SLN  Potassium 3.7 3.5-5.3 mEq/L SLN  Chloride 101 96-112 mEq/L SLN  CO2 19 19-32 mEq/L SLN  Glucose 170 H 70-99 mg/dL SLN  BUN 29 H 3-29 mg/dL SLN  Creatinine 5.18 H 0.50-1.10 mg/dL SLN  Calcium 8.9 8.4-16.6 mg/dL SLN  Lipid Profile  Result: 09/01/2013 6:05 PM ( Status: F )  Cholesterol 196 0-200 mg/dL SLN C  Triglyceride 063 H <150 mg/dL SLN  HDL Cholesterol 37 L >39 mg/dL SLN  Total  Chol/HDL Ratio 5.3 Ratio SLN  VLDL Cholesterol (Calc) 43 H 0-40 mg/dL SLN  LDL Cholesterol (Calc) 116 H 0-99 mg/dL SLN C  Liver Profile  Result: 09/01/2013 6:05 PM ( Status: F )  Bilirubin, Total 0.3 0.2-1.2 mg/dL SLN  Bilirubin, Direct <0.1 0.0-0.3 mg/dL SLN  Indirect Bilirubin NOT CALC 0.2-1.2 mg/dL SLN  Alkaline Phosphatase 132 H 39-117 U/L SLN  AST/SGOT 21 0-37 U/L SLN  ALT/SGPT 10 0-35 U/L SLN  Total Protein 6.8 6.0-8.3 g/dL SLN  Albumin 3.4 L 1.6-1.0 g/dL SLN  Creatinine  Result: 09/01/2013 6:05 PM ( Status: F )  Creatinine 2.63 H 0.50-1.10 mg/dL SLN  TSH, Ultrasensitive  Result: 09/01/2013 4:07 PM ( Status: F )  TSH 0.360 0.350-4.500 uIU/mL SLN  Hemoglobin A1C  Result: 09/01/2013 4:23 PM ( Status: F )  Hemoglobin A1C 9.3 H <5.7 % SLN C  Estimated Average Glucose 220 H  Lipid Profile  Result: 10/07/2013 11:34 AM ( Status: F ) C  Cholesterol 191 0-200 mg/dL SLN C  Triglyceride 960 <150 mg/dL SLN  HDL Cholesterol 36 L >39 mg/dL SLN  Total Chol/HDL Ratio 5.3 Ratio SLN  VLDL Cholesterol (Calc) 30 4-54 mg/dL SLN  LDL Cholesterol (Calc) 125 H 0-99 mg/dL SLN C  Liver Profile  Result: 10/07/2013 11:34 AM ( Status: F )  Bilirubin, Total 0.2 0.2-1.2 mg/dL SLN  Bilirubin, Direct <0.1 0.0-0.3 mg/dL SLN  Indirect Bilirubin NOT CALC 0.2-1.2 mg/dL SLN  Alkaline Phosphatase 97 39-117 U/L SLN  AST/SGOT 24 0-37 U/L SLN  ALT/SGPT 10 0-35 U/L SLN  Total Protein 6.4  6.0-8.3 g/dL SLN  Albumin 3.2 L 0.9-8.1 g/dL SLN  Creatinine  Result: 10/07/2013 11:34 AM ( Status: F )  Creatinine 1.86 H 0.50-1.10 mg/dL SLN  TSH, Ultrasensitive  Result: 10/07/2013 11:06 AM ( Status: F )  TSH 1.241 0.350-4.500 uIU/mL SLN  Hemoglobin A1C  Result: 10/07/2013 4:51 PM ( Status: F )  Hemoglobin A1C 9.8 H <5.7 % SLN C  Estimated Average Glucose 235 H <117 mg/dL SLN   Assessment/Plan 1. HYPERTENSION -controlled on medications   2. Diabetes mellitus with neurological manifestations, uncontrolled -remains with fluctuations in blood sugar 80s-400s). Pt inconsistent with diet and taking medications   3. Unspecified hypothyroidism TSH stable on synthroid 88 mcgs  4. Neuropathy -has improved on lyrica  5. Hyperlipidemia -LDL has improved however not at goal, conts on crestor 40 mg daily -discussed diet modifications  6. URINARY INCONTINENCE -stable on myrbetriq

## 2013-11-16 ENCOUNTER — Other Ambulatory Visit: Payer: Self-pay | Admitting: *Deleted

## 2013-11-16 MED ORDER — OXYCODONE HCL 5 MG PO TABS: ORAL_TABLET | ORAL | Status: DC

## 2013-11-16 NOTE — Telephone Encounter (Signed)
[facility] 

## 2013-11-20 ENCOUNTER — Encounter (HOSPITAL_COMMUNITY): Payer: Self-pay | Admitting: Emergency Medicine

## 2013-11-20 ENCOUNTER — Emergency Department (HOSPITAL_COMMUNITY): Payer: Medicare Other

## 2013-11-20 ENCOUNTER — Inpatient Hospital Stay (HOSPITAL_COMMUNITY)
Admission: EM | Admit: 2013-11-20 | Discharge: 2013-12-02 | DRG: 871 | Disposition: A | Discharge status: Skilled Nursing Facility | Payer: Medicare Other | Attending: Internal Medicine | Admitting: Internal Medicine

## 2013-11-20 DIAGNOSIS — Z8614 Personal history of Methicillin resistant Staphylococcus aureus infection: Secondary | ICD-10-CM | POA: Diagnosis not present

## 2013-11-20 DIAGNOSIS — I5033 Acute on chronic diastolic (congestive) heart failure: Secondary | ICD-10-CM

## 2013-11-20 DIAGNOSIS — E1165 Type 2 diabetes mellitus with hyperglycemia: Secondary | ICD-10-CM

## 2013-11-20 DIAGNOSIS — IMO0002 Reserved for concepts with insufficient information to code with codable children: Secondary | ICD-10-CM

## 2013-11-20 DIAGNOSIS — I739 Peripheral vascular disease, unspecified: Secondary | ICD-10-CM | POA: Diagnosis present

## 2013-11-20 DIAGNOSIS — Z7982 Long term (current) use of aspirin: Secondary | ICD-10-CM | POA: Diagnosis not present

## 2013-11-20 DIAGNOSIS — I469 Cardiac arrest, cause unspecified: Secondary | ICD-10-CM

## 2013-11-20 DIAGNOSIS — K7581 Nonalcoholic steatohepatitis (NASH): Secondary | ICD-10-CM | POA: Diagnosis present

## 2013-11-20 DIAGNOSIS — R1310 Dysphagia, unspecified: Secondary | ICD-10-CM

## 2013-11-20 DIAGNOSIS — E1149 Type 2 diabetes mellitus with other diabetic neurological complication: Secondary | ICD-10-CM | POA: Diagnosis present

## 2013-11-20 DIAGNOSIS — N179 Acute kidney failure, unspecified: Secondary | ICD-10-CM | POA: Diagnosis present

## 2013-11-20 DIAGNOSIS — G9341 Metabolic encephalopathy: Secondary | ICD-10-CM

## 2013-11-20 DIAGNOSIS — N189 Chronic kidney disease, unspecified: Secondary | ICD-10-CM

## 2013-11-20 DIAGNOSIS — N183 Chronic kidney disease, stage 3 unspecified: Secondary | ICD-10-CM | POA: Diagnosis present

## 2013-11-20 DIAGNOSIS — E876 Hypokalemia: Secondary | ICD-10-CM

## 2013-11-20 DIAGNOSIS — G547 Phantom limb syndrome without pain: Secondary | ICD-10-CM | POA: Diagnosis present

## 2013-11-20 DIAGNOSIS — K7689 Other specified diseases of liver: Secondary | ICD-10-CM | POA: Diagnosis present

## 2013-11-20 DIAGNOSIS — G894 Chronic pain syndrome: Secondary | ICD-10-CM

## 2013-11-20 DIAGNOSIS — A419 Sepsis, unspecified organism: Secondary | ICD-10-CM | POA: Diagnosis present

## 2013-11-20 DIAGNOSIS — I509 Heart failure, unspecified: Secondary | ICD-10-CM | POA: Diagnosis present

## 2013-11-20 DIAGNOSIS — I69959 Hemiplegia and hemiparesis following unspecified cerebrovascular disease affecting unspecified side: Secondary | ICD-10-CM | POA: Diagnosis not present

## 2013-11-20 DIAGNOSIS — E872 Acidosis, unspecified: Secondary | ICD-10-CM | POA: Diagnosis present

## 2013-11-20 DIAGNOSIS — R5381 Other malaise: Secondary | ICD-10-CM | POA: Diagnosis present

## 2013-11-20 DIAGNOSIS — T17908S Unspecified foreign body in respiratory tract, part unspecified causing other injury, sequela: Secondary | ICD-10-CM

## 2013-11-20 DIAGNOSIS — E873 Alkalosis: Secondary | ICD-10-CM

## 2013-11-20 DIAGNOSIS — G40909 Epilepsy, unspecified, not intractable, without status epilepticus: Secondary | ICD-10-CM | POA: Diagnosis present

## 2013-11-20 DIAGNOSIS — E785 Hyperlipidemia, unspecified: Secondary | ICD-10-CM | POA: Diagnosis present

## 2013-11-20 DIAGNOSIS — R7989 Other specified abnormal findings of blood chemistry: Secondary | ICD-10-CM | POA: Diagnosis present

## 2013-11-20 DIAGNOSIS — IMO0001 Reserved for inherently not codable concepts without codable children: Secondary | ICD-10-CM

## 2013-11-20 DIAGNOSIS — J9600 Acute respiratory failure, unspecified whether with hypoxia or hypercapnia: Secondary | ICD-10-CM | POA: Diagnosis present

## 2013-11-20 DIAGNOSIS — S78119A Complete traumatic amputation at level between unspecified hip and knee, initial encounter: Secondary | ICD-10-CM | POA: Diagnosis not present

## 2013-11-20 DIAGNOSIS — I10 Essential (primary) hypertension: Secondary | ICD-10-CM

## 2013-11-20 DIAGNOSIS — G929 Unspecified toxic encephalopathy: Secondary | ICD-10-CM | POA: Diagnosis present

## 2013-11-20 DIAGNOSIS — Z66 Do not resuscitate: Secondary | ICD-10-CM | POA: Diagnosis present

## 2013-11-20 DIAGNOSIS — R4182 Altered mental status, unspecified: Secondary | ICD-10-CM | POA: Diagnosis present

## 2013-11-20 DIAGNOSIS — Z79899 Other long term (current) drug therapy: Secondary | ICD-10-CM

## 2013-11-20 DIAGNOSIS — E870 Hyperosmolality and hypernatremia: Secondary | ICD-10-CM

## 2013-11-20 DIAGNOSIS — G9340 Encephalopathy, unspecified: Secondary | ICD-10-CM

## 2013-11-20 DIAGNOSIS — Z794 Long term (current) use of insulin: Secondary | ICD-10-CM

## 2013-11-20 DIAGNOSIS — I5023 Acute on chronic systolic (congestive) heart failure: Secondary | ICD-10-CM | POA: Diagnosis present

## 2013-11-20 DIAGNOSIS — E039 Hypothyroidism, unspecified: Secondary | ICD-10-CM | POA: Diagnosis present

## 2013-11-20 DIAGNOSIS — R509 Fever, unspecified: Secondary | ICD-10-CM

## 2013-11-20 DIAGNOSIS — E874 Mixed disorder of acid-base balance: Secondary | ICD-10-CM | POA: Diagnosis present

## 2013-11-20 DIAGNOSIS — G92 Toxic encephalopathy: Secondary | ICD-10-CM | POA: Diagnosis present

## 2013-11-20 DIAGNOSIS — R6520 Severe sepsis without septic shock: Secondary | ICD-10-CM | POA: Diagnosis present

## 2013-11-20 DIAGNOSIS — D638 Anemia in other chronic diseases classified elsewhere: Secondary | ICD-10-CM | POA: Diagnosis present

## 2013-11-20 DIAGNOSIS — I129 Hypertensive chronic kidney disease with stage 1 through stage 4 chronic kidney disease, or unspecified chronic kidney disease: Secondary | ICD-10-CM | POA: Diagnosis present

## 2013-11-20 DIAGNOSIS — J189 Pneumonia, unspecified organism: Secondary | ICD-10-CM

## 2013-11-20 DIAGNOSIS — I69920 Aphasia following unspecified cerebrovascular disease: Secondary | ICD-10-CM

## 2013-11-20 DIAGNOSIS — K567 Ileus, unspecified: Secondary | ICD-10-CM

## 2013-11-20 DIAGNOSIS — J690 Pneumonitis due to inhalation of food and vomit: Secondary | ICD-10-CM | POA: Diagnosis present

## 2013-11-20 DIAGNOSIS — E108 Type 1 diabetes mellitus with unspecified complications: Secondary | ICD-10-CM

## 2013-11-20 DIAGNOSIS — Z515 Encounter for palliative care: Secondary | ICD-10-CM

## 2013-11-20 DIAGNOSIS — I214 Non-ST elevation (NSTEMI) myocardial infarction: Secondary | ICD-10-CM

## 2013-11-20 DIAGNOSIS — I5032 Chronic diastolic (congestive) heart failure: Secondary | ICD-10-CM

## 2013-11-20 DIAGNOSIS — R109 Unspecified abdominal pain: Secondary | ICD-10-CM

## 2013-11-20 DIAGNOSIS — E1065 Type 1 diabetes mellitus with hyperglycemia: Secondary | ICD-10-CM

## 2013-11-20 DIAGNOSIS — R778 Other specified abnormalities of plasma proteins: Secondary | ICD-10-CM | POA: Diagnosis present

## 2013-11-20 DIAGNOSIS — Z86718 Personal history of other venous thrombosis and embolism: Secondary | ICD-10-CM | POA: Diagnosis not present

## 2013-11-20 DIAGNOSIS — J9601 Acute respiratory failure with hypoxia: Secondary | ICD-10-CM

## 2013-11-20 DIAGNOSIS — D72829 Elevated white blood cell count, unspecified: Secondary | ICD-10-CM

## 2013-11-20 DIAGNOSIS — R569 Unspecified convulsions: Secondary | ICD-10-CM | POA: Diagnosis present

## 2013-11-20 DIAGNOSIS — K9189 Other postprocedural complications and disorders of digestive system: Secondary | ICD-10-CM

## 2013-11-20 LAB — URINALYSIS, ROUTINE W REFLEX MICROSCOPIC
Bilirubin Urine: NEGATIVE
GLUCOSE, UA: 100 mg/dL — AB
KETONES UR: NEGATIVE mg/dL
LEUKOCYTES UA: NEGATIVE
NITRITE: NEGATIVE
Protein, ur: >300 mg/dL — AB
SPECIFIC GRAVITY, URINE: 1.017 (ref 1.005–1.030)
Urobilinogen, UA: 0.2 mg/dL (ref 0.0–1.0)
pH: 6 (ref 5.0–8.0)

## 2013-11-20 LAB — CBC WITH DIFFERENTIAL/PLATELET
BASOS ABS: 0 10*3/uL (ref 0.0–0.1)
BASOS PCT: 0 % (ref 0–1)
Eosinophils Absolute: 0.1 10*3/uL (ref 0.0–0.7)
Eosinophils Relative: 0 % (ref 0–5)
HCT: 40.9 % (ref 36.0–46.0)
Hemoglobin: 12.5 g/dL (ref 12.0–15.0)
LYMPHS PCT: 24 % (ref 12–46)
Lymphs Abs: 4 10*3/uL (ref 0.7–4.0)
MCH: 27.7 pg (ref 26.0–34.0)
MCHC: 30.6 g/dL (ref 30.0–36.0)
MCV: 90.7 fL (ref 78.0–100.0)
Monocytes Absolute: 1.1 10*3/uL — ABNORMAL HIGH (ref 0.1–1.0)
Monocytes Relative: 7 % (ref 3–12)
NEUTROS ABS: 11.6 10*3/uL — AB (ref 1.7–7.7)
Neutrophils Relative %: 69 % (ref 43–77)
PLATELETS: 326 10*3/uL (ref 150–400)
RBC: 4.51 MIL/uL (ref 3.87–5.11)
RDW: 14.8 % (ref 11.5–15.5)
WBC: 16.7 10*3/uL — AB (ref 4.0–10.5)

## 2013-11-20 LAB — I-STAT TROPONIN, ED: Troponin i, poc: 0.02 ng/mL (ref 0.00–0.08)

## 2013-11-20 LAB — BLOOD GAS, ARTERIAL
Acid-base deficit: 2.9 mmol/L — ABNORMAL HIGH (ref 0.0–2.0)
Bicarbonate: 22.7 mEq/L (ref 20.0–24.0)
Drawn by: 312761
O2 Content: 2 L/min
O2 Saturation: 87.4 %
PCO2 ART: 48.6 mmHg — AB (ref 35.0–45.0)
PH ART: 7.291 — AB (ref 7.350–7.450)
Patient temperature: 98.6
TCO2: 24.2 mmol/L (ref 0–100)
pO2, Arterial: 56.8 mmHg — ABNORMAL LOW (ref 80.0–100.0)

## 2013-11-20 LAB — COMPREHENSIVE METABOLIC PANEL
ALBUMIN: 3.2 g/dL — AB (ref 3.5–5.2)
ALT: 27 U/L (ref 0–35)
AST: 68 U/L — ABNORMAL HIGH (ref 0–37)
Alkaline Phosphatase: 131 U/L — ABNORMAL HIGH (ref 39–117)
Anion gap: 20 — ABNORMAL HIGH (ref 5–15)
BUN: 32 mg/dL — ABNORMAL HIGH (ref 6–23)
CHLORIDE: 100 meq/L (ref 96–112)
CO2: 21 meq/L (ref 19–32)
Calcium: 9.3 mg/dL (ref 8.4–10.5)
Creatinine, Ser: 2.8 mg/dL — ABNORMAL HIGH (ref 0.50–1.10)
GFR calc Af Amer: 19 mL/min — ABNORMAL LOW (ref >90)
GFR, EST NON AFRICAN AMERICAN: 17 mL/min — AB (ref >90)
Glucose, Bld: 182 mg/dL — ABNORMAL HIGH (ref 70–99)
Potassium: 3.5 mEq/L — ABNORMAL LOW (ref 3.7–5.3)
Sodium: 141 mEq/L (ref 137–147)
Total Bilirubin: <0.2 mg/dL — ABNORMAL LOW (ref 0.3–1.2)
Total Protein: 8.3 g/dL (ref 6.0–8.3)

## 2013-11-20 LAB — CBG MONITORING, ED
GLUCOSE-CAPILLARY: 103 mg/dL — AB (ref 70–99)
Glucose-Capillary: 143 mg/dL — ABNORMAL HIGH (ref 70–99)
Glucose-Capillary: 149 mg/dL — ABNORMAL HIGH (ref 70–99)

## 2013-11-20 LAB — GLUCOSE, CAPILLARY
GLUCOSE-CAPILLARY: 80 mg/dL (ref 70–99)
Glucose-Capillary: 84 mg/dL (ref 70–99)

## 2013-11-20 LAB — PRO B NATRIURETIC PEPTIDE: PRO B NATRI PEPTIDE: 832.1 pg/mL — AB (ref 0–125)

## 2013-11-20 LAB — MAGNESIUM: Magnesium: 2.3 mg/dL (ref 1.5–2.5)

## 2013-11-20 LAB — MRSA PCR SCREENING: MRSA by PCR: NEGATIVE

## 2013-11-20 LAB — LACTIC ACID, PLASMA: Lactic Acid, Venous: 1.2 mmol/L (ref 0.5–2.2)

## 2013-11-20 LAB — URINE MICROSCOPIC-ADD ON

## 2013-11-20 LAB — I-STAT CG4 LACTIC ACID, ED: Lactic Acid, Venous: 2.31 mmol/L — ABNORMAL HIGH (ref 0.5–2.2)

## 2013-11-20 MED ORDER — NALOXONE HCL 0.4 MG/ML IJ SOLN
0.4000 mg | Freq: Once | INTRAMUSCULAR | Status: AC
  Administered 2013-11-20: 0.4 mg via INTRAVENOUS
  Filled 2013-11-20: qty 1

## 2013-11-20 MED ORDER — SODIUM CHLORIDE 0.9 % IJ SOLN
3.0000 mL | Freq: Two times a day (BID) | INTRAMUSCULAR | Status: DC
  Administered 2013-11-20 – 2013-11-25 (×8): 3 mL via INTRAVENOUS

## 2013-11-20 MED ORDER — POTASSIUM CHLORIDE IN NACL 20-0.9 MEQ/L-% IV SOLN
INTRAVENOUS | Status: DC
  Administered 2013-11-20 – 2013-11-21 (×2): via INTRAVENOUS
  Filled 2013-11-20 (×4): qty 1000

## 2013-11-20 MED ORDER — SODIUM CHLORIDE 0.9 % IV SOLN
INTRAVENOUS | Status: DC
  Administered 2013-11-20: 125 mL/h via INTRAVENOUS

## 2013-11-20 MED ORDER — INSULIN GLARGINE 100 UNIT/ML ~~LOC~~ SOLN
66.0000 [IU] | Freq: Every day | SUBCUTANEOUS | Status: DC
  Filled 2013-11-20: qty 0.66

## 2013-11-20 MED ORDER — NALOXONE HCL 1 MG/ML IJ SOLN
0.2500 mg/h | INTRAVENOUS | Status: DC
  Administered 2013-11-20: 0.25 mg/h via INTRAVENOUS
  Filled 2013-11-20: qty 4

## 2013-11-20 MED ORDER — VANCOMYCIN HCL IN DEXTROSE 1-5 GM/200ML-% IV SOLN
1000.0000 mg | INTRAVENOUS | Status: DC
  Administered 2013-11-21 – 2013-11-24 (×3): 1000 mg via INTRAVENOUS
  Filled 2013-11-20 (×4): qty 200

## 2013-11-20 MED ORDER — PIPERACILLIN-TAZOBACTAM 3.375 G IVPB
3.3750 g | Freq: Three times a day (TID) | INTRAVENOUS | Status: DC
  Administered 2013-11-21 – 2013-11-25 (×14): 3.375 g via INTRAVENOUS
  Filled 2013-11-20 (×17): qty 50

## 2013-11-20 MED ORDER — CLONIDINE HCL 0.2 MG/24HR TD PTWK
0.2000 mg | MEDICATED_PATCH | TRANSDERMAL | Status: DC
  Administered 2013-11-20: 0.2 mg via TRANSDERMAL
  Filled 2013-11-20: qty 1

## 2013-11-20 MED ORDER — HEPARIN SODIUM (PORCINE) 5000 UNIT/ML IJ SOLN
5000.0000 [IU] | Freq: Three times a day (TID) | INTRAMUSCULAR | Status: DC
  Administered 2013-11-20 – 2013-11-21 (×3): 5000 [IU] via SUBCUTANEOUS
  Filled 2013-11-20 (×4): qty 1

## 2013-11-20 MED ORDER — PIPERACILLIN-TAZOBACTAM 3.375 G IVPB 30 MIN
3.3750 g | Freq: Once | INTRAVENOUS | Status: AC
  Administered 2013-11-20: 3.375 g via INTRAVENOUS
  Filled 2013-11-20: qty 50

## 2013-11-20 MED ORDER — INSULIN ASPART 100 UNIT/ML ~~LOC~~ SOLN
0.0000 [IU] | SUBCUTANEOUS | Status: DC
  Administered 2013-11-21: 1 [IU] via SUBCUTANEOUS
  Administered 2013-11-21: 5 [IU] via SUBCUTANEOUS
  Administered 2013-11-21: 3 [IU] via SUBCUTANEOUS
  Administered 2013-11-21: 2 [IU] via SUBCUTANEOUS
  Administered 2013-11-22 (×3): 5 [IU] via SUBCUTANEOUS
  Administered 2013-11-22 (×2): 3 [IU] via SUBCUTANEOUS
  Administered 2013-11-23 (×3): 5 [IU] via SUBCUTANEOUS
  Administered 2013-11-23: 2 [IU] via SUBCUTANEOUS
  Administered 2013-11-23: 3 [IU] via SUBCUTANEOUS
  Administered 2013-11-23 – 2013-11-24 (×2): 2 [IU] via SUBCUTANEOUS
  Administered 2013-11-24: 3 [IU] via SUBCUTANEOUS
  Administered 2013-11-24: 2 [IU] via SUBCUTANEOUS
  Administered 2013-11-24 (×3): 3 [IU] via SUBCUTANEOUS
  Administered 2013-11-25: 1 [IU] via SUBCUTANEOUS
  Administered 2013-11-25 (×2): 2 [IU] via SUBCUTANEOUS
  Administered 2013-11-25: 3 [IU] via SUBCUTANEOUS
  Administered 2013-11-25: 2 [IU] via SUBCUTANEOUS
  Administered 2013-11-25: 1 [IU] via SUBCUTANEOUS
  Administered 2013-11-26 (×2): 2 [IU] via SUBCUTANEOUS
  Administered 2013-11-26: 1 [IU] via SUBCUTANEOUS
  Administered 2013-11-26: 3 [IU] via SUBCUTANEOUS
  Administered 2013-11-26 – 2013-11-27 (×4): 2 [IU] via SUBCUTANEOUS
  Administered 2013-11-27 (×2): 1 [IU] via SUBCUTANEOUS
  Administered 2013-11-28: 3 [IU] via SUBCUTANEOUS
  Administered 2013-11-28: 5 [IU] via SUBCUTANEOUS
  Administered 2013-11-28: 3 [IU] via SUBCUTANEOUS

## 2013-11-20 MED ORDER — ONDANSETRON HCL 4 MG/2ML IJ SOLN
4.0000 mg | Freq: Once | INTRAMUSCULAR | Status: AC
  Administered 2013-11-20: 4 mg via INTRAVENOUS
  Filled 2013-11-20: qty 2

## 2013-11-20 NOTE — H&P (Addendum)
Hospitalist Admission History and Physical  Patient name: Allison Washington Medical record number: [id number] Date of birth: 09/17/1945 Age: 68 y.o. Gender: female  Primary Care Provider: Kimber Relic, MD  Chief Complaint: encephalopathy, sepsis   History of Present Illness:This is a 68 y.o. year old female with multiple medical problems including HTN, CVA, chronic pain, IDDM, seizure d/o presenting with encephalopathy, sepsis. Pt is a resident at local SNF. Per report, pt was unresponsive earlier today despite vigorous arousal. Family reports that pt has an overall fairly low functional level at baseline. Family is unaware of any recent falls, infections, episodes of nausea, vomiting, diarrhea, increased urinary frequency. Blood sugar was low on EMS arrival. Was given d50 with blood sugars increasing into 200s. There was reported multiple episodes of vomiting in transport to ER.  On arrival to ER, temp 97.6, HR 70s-100s, BP 150s-230s systolic, Satting >95% on non rebreather. WBC 16.7, Hgb 12.5, K 3.5, Cr 2.8 (baselin 1.9), Glu 182. Lactate 2.3. Trop WNL. UA pending. Head CT with no acute findings. CXR with chronic lung changes with superimposed edema without infiltrate. Pt with fentanyl patch in place on arrival. This was removed. Pt was given small dose of narcan with subsequent improvement in mentation, albeit transient. I have asked EDP to start pt on narcan gtt.   Assessment and Plan:  Allison Washington is a 68 y.o. year old female presenting with encephalopathy, sepsis   Active Problems:   Sepsis   Encephalopathy   Encephalopathy: Likely multifactorial with contributions including toxic/metabolic etiologies, ? Infection, medication. Hydrate pt. Fentanyl patch removed. Narcan gtt. Hold neurotropic meds. vanc and zosyn. Pan culture. EEG. MRI to rule CVA recurrence. Repeat CXR in am.   Acute Resp failure: Likely secondary to above. Transient improvement with narcan administration. CXR  negative for PNA currently, though high risk for aspiration event. Nonbreather in the interim. Narcan gtt.   Sepsis: Pan culture. Broad spectrum abx. No overt signs of infection currently. UA pending. Repeat CXR in am given recurrent aspiration. Stepdown bed. Trend lactate.   AKI: Likely prerenal in etiology. Clinically dry on exam. Hydrate pt.   IDDM: SSI. A1C.   HTN: clonidine patch. Hold oral meds.   FEN/GI: NPO for now. Replete K. mag level.  Prophylaxis: sub q heaprin  Disposition: pending further evaluation- had a relatively lengthy discussion with family about overall treatment plan and goals. Reassess in am.  Code Status:DNR    Patient Active Problem List   Diagnosis Date Noted  . Unspecified hypothyroidism 04/07/2013  . Neuropathy 02/02/2013  . Other and unspecified hyperlipidemia 10/20/2012  . CHF (congestive heart failure) 02/09/2012  . Acute renal insufficiency 02/09/2012  . CVA (cerebral vascular accident) 03/06/2011  . S/P AKA (above knee amputation) unilateral 03/06/2011  . Hypertensive urgency 03/06/2011  . Diabetes mellitus with neurological manifestations, uncontrolled 12/17/2006  . HYPERTENSION 12/17/2006  . SEIZURE DISORDER 12/17/2006  . URINARY INCONTINENCE 12/17/2006   Past Medical History: Past Medical History  Diagnosis Date  . 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 Diabetes mellitus     Prone to hypoglycemia  . Aphagia     Status post numerous  . Dysphagia   . Peripheral vascular disease, unspecified   . Arterial disease   . Depressive disorder   . Hypothyroid   . Seizures   . MRSA infection  History of MRSA in the left leg wound and sacrum in the past-has been seen by vascular surgery and declined any type of intervention including arteriogram  . Diverticulosis     GI bleed in 2007-s/p endoscopy and  colonoscopy in 2007 s/p massive GIB consittent with Diveerticulosis  . DVT (deep venous thrombosis)     Not on Coumadin  . Anemia     Chronic  . Non-smoker   . Stroke   . Hypertension   . History of DVT (deep vein thrombosis) 03/06/2011  . Nursing home acquired MRSA infection 03/06/2011    History of MRSA in the left leg wound and sacrum in the past-has been seen by vascular surgery and declined any type of intervention including arteriogram   . SEIZURE DISORDER 12/17/2006    Qualifier: Diagnosis of  By: Clent Ridges MD, Tera Mater     Past Surgical History: Past Surgical History  Procedure Laterality Date  . Above knee leg amputation    . Femoral-popliteal bypass graft      Social History: History   Social History  . Marital Status: Widowed    Spouse Name: N/A    Number of Children: N/A  . Years of Education: N/A   Social History Main Topics  . Smoking status: Never Smoker   . Smokeless tobacco: None  . Alcohol Use: No  . Drug Use: No  . Sexual Activity: Not Currently   Other Topics Concern  . None   Social History Narrative   Deforest Hoyles Daughter [phone] Anjenette, Gerbino [phone]         Is a [facility] patient and states that Dr. Leanord Hawking is her primary care physician also states that Dr. Frederik Pear is a primary care physician.                Family History: History reviewed. No pertinent family history.  Allergies: No Known Allergies  Current Facility-Administered Medications  Medication Dose Route Frequency Provider Last Rate Last Dose  . 0.9 %  sodium chloride infusion   Intravenous Continuous Doree Albee, MD      . cloNIDine (CATAPRES - Dosed in mg/24 hr) patch 0.2 mg  0.2 mg Transdermal Weekly Doree Albee, MD      . heparin injection 5,000 Units  5,000 Units Subcutaneous 3 times per day Doree Albee, MD      . insulin aspart (novoLOG) injection 0-9 Units  0-9 Units Subcutaneous 6 times per day Doree Albee, MD      .  Melene Muller ON 11/21/2013] insulin glargine (LANTUS) injection 66 Units  66 Units Subcutaneous Q1200 Doree Albee, MD      . naloxone [facility]) 4 mg in dextrose 5 % 250 mL infusion  0.25 mg/hr Intravenous Continuous Imagene Sheller, MD      . sodium chloride 0.9 % injection 3 mL  3 mL Intravenous Q12H Doree Albee, MD       Current Outpatient Prescriptions  Medication Sig Dispense Refill  . Amino Acids-Protein Hydrolys (FEEDING SUPPLEMENT, PRO-STAT SUGAR FREE 64,) LIQD Take 30 mLs by mouth every morning.      Marland Kitchen amLODipine (NORVASC) 10 MG tablet Take 10 mg by mouth every morning.      Marland Kitchen aspirin 81 MG tablet Take 324 mg by mouth every morning.       . cloNIDine (CATAPRES - DOSED IN MG/24 HR) 0.2 mg/24hr patch Place 1 patch onto the skin once a week.      . fentaNYL (DURAGESIC - DOSED MCG/HR)  50 MCG/HR Place 1 patch (50 mcg total) onto the skin every 3 (three) days.  10 patch  0  . fish oil-omega-3 fatty acids 1000 MG capsule Take 1 g by mouth 2 (two) times daily.       . furosemide (LASIX) 40 MG tablet Take 40 mg by mouth every morning.      Marland Kitchen. gemfibrozil (LOPID) 600 MG tablet Take 600 mg by mouth 2 (two) times daily before a meal.      . insulin aspart (NOVOLOG FLEXPEN) 100 UNIT/ML FlexPen Inject 7-10 Units into the skin daily with supper. 10 units with breakfast and lunch, 7 units with supper, hold for CBG < 80      . insulin glargine (LANTUS) 100 UNIT/ML injection Inject 66 Units into the skin daily at 12 noon.       Marland Kitchen. levothyroxine (SYNTHROID, LEVOTHROID) 88 MCG tablet Take 88 mcg by mouth daily. 4 pm      . linagliptin (TRADJENTA) 5 MG TABS tablet Take 5 mg by mouth every morning.       Marland Kitchen. LORazepam (ATIVAN) 0.5 MG tablet Take 0.5 mg by mouth every 8 (eight) hours as needed for anxiety.      Marland Kitchen. losartan (COZAAR) 25 MG tablet Take 25 mg by mouth 2 (two) times daily.      . metoprolol tartrate (LOPRESSOR) 25 MG tablet Take 25 mg by mouth 2 (two) times daily.      . mirabegron ER (MYRBETRIQ) 50 MG TB24  tablet Take 50 mg by mouth every morning.       Marland Kitchen. oxyCODONE (OXY IR/ROXICODONE) 5 MG immediate release tablet Take two tablets by mouth every four hours as needed for pain  360 tablet  0  . polyethylene glycol (MIRALAX / GLYCOLAX) packet Take 17 g by mouth every morning. Mix in 8 ounces of water; for constipation      . pregabalin (LYRICA) 50 MG capsule Take 50 mg by mouth 3 (three) times daily.      . rosuvastatin (CRESTOR) 40 MG tablet Take 40 mg by mouth at bedtime.       . sennosides-docusate sodium (SENOKOT-S) 8.6-50 MG tablet Take 2 tablets by mouth every morning.      . zolpidem (AMBIEN) 5 MG tablet Take 2.5 mg by mouth at bedtime as needed for sleep.      Marland Kitchen. acetaminophen (TYLENOL) 325 MG tablet Take 650 mg by mouth every 6 (six) hours as needed for fever (pain).       . nitroGLYCERIN (NITROSTAT) 0.4 MG SL tablet Place 0.4 mg under the tongue every 5 (five) minutes x 3 doses as needed for chest pain. For chest pain       Review Of Systems: 12 point ROS negative except as noted above in HPI.  Physical Exam: Filed Vitals:   11/20/13 2006  BP:   Pulse: 72  Temp:   Resp: 10    General: minimally responsive  HEENT: pupils, mildly fixed  Heart: S1, S2 normal, no murmur, rub or gallop, regular rate and rhythm Lungs: clear to auscultation, no wheezes or rales and unlabored breathing Abdomen:+ abd distension, non tender  Extremities: R BKA, 2+ peripheral pulses on L  Skin:no rashes, no ecchymoses Neurology: minimally responsive to exam   Labs and Imaging: Lab Results  Component Value Date/Time   NA 141 11/20/2013  5:05 PM   K 3.5* 11/20/2013  5:05 PM   CL 100 11/20/2013  5:05 PM   CO2 21 11/20/2013  5:05 PM   BUN 32* 11/20/2013  5:05 PM   CREATININE 2.80* 11/20/2013  5:05 PM   GLUCOSE 182* 11/20/2013  5:05 PM   Lab Results  Component Value Date   WBC 16.7* 11/20/2013   HGB 12.5 11/20/2013   HCT 40.9 11/20/2013   MCV 90.7 11/20/2013   PLT 326 11/20/2013    Ct Head Wo  Contrast  11/20/2013   CLINICAL DATA:  Loss of consciousness, history of stroke  EXAM: CT HEAD WITHOUT CONTRAST  TECHNIQUE: Contiguous axial images were obtained from the base of the skull through the vertex without intravenous contrast.  COMPARISON:  07/13/2007  FINDINGS: Mild to moderate diffuse atrophy. Moderate low attenuation in the deep white matter. Evidence of stable chronic lacunar infarct in the region of the bilateral basal ganglia. Tiny lacunar infarct deep periventricular white matter image number 18, also stable. Stable lacunar infarct in the pons. No evidence of vascular territory infarct. No evidence of hemorrhage or extra-axial fluid. No hydrocephalus. Calvarium is intact.  IMPRESSION: Chronic involutional change and chronic lacunar infarcts, stable from prior study. No acute findings.   Electronically Signed   By: Esperanza Heir M.D.   On: 11/20/2013 20:02   Dg Chest Port 1 View  11/20/2013   CLINICAL DATA:  Altered mental status.  EXAM: PORTABLE CHEST - 1 VIEW  COMPARISON:  02/09/2012.  FINDINGS: The heart is upper limits of normal in size and stable. There is tortuosity and calcification of the thoracic aorta. Diffuse increased interstitial markings suspicious for interstitial edema. No definite pleural effusions or focal infiltrates.  IMPRESSION: Chronic lung changes with possible superimposed interstitial edema. No infiltrates or effusions.   Electronically Signed   By: Loralie Champagne M.D.   On: 11/20/2013 17:42           Doree Albee MD  Pager: [phone]

## 2013-11-20 NOTE — Progress Notes (Signed)
ANTIBIOTIC CONSULT NOTE - INITIAL  Pharmacy Consult for Vancomycin, Zosyn Indication: Sepsis   No Known Allergies  Patient Measurements:   Adjusted Body Weight: n/a   Vital Signs: Temp: 97.6 F (36.4 C) (07/24 1920) Temp src: Rectal (07/24 1920) BP: 156/61 mmHg (07/24 2005) Pulse Rate: 72 (07/24 2006) Intake/Output from previous day:   Intake/Output from this shift:    Labs:  Recent Labs  11/20/13 1705  WBC 16.7*  HGB 12.5  PLT 326  CREATININE 2.80*   The CrCl is unknown because both a height and weight (above a minimum accepted value) are required for this calculation. No results found for this basename: VANCOTROUGH, VANCOPEAK, VANCORANDOM, GENTTROUGH, GENTPEAK, GENTRANDOM, TOBRATROUGH, TOBRAPEAK, TOBRARND, AMIKACINPEAK, AMIKACINTROU, AMIKACIN,  in the last 72 hours   Microbiology: No results found for this or any previous visit (from the past 720 hour(s)).  Medical History: Past Medical History  Diagnosis Date  . 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 Diabetes mellitus     Prone to hypoglycemia  . Aphagia     Status post numerous  . Dysphagia   . Peripheral vascular disease, unspecified   . Arterial disease   . Depressive disorder   . Hypothyroid   . Seizures   . MRSA infection     History of MRSA in the left leg wound and sacrum in the past-has been seen by vascular surgery and declined any type of intervention including arteriogram  . Diverticulosis     GI bleed in 2007-s/p endoscopy and colonoscopy in 2007 s/p massive GIB consittent with Diveerticulosis  . DVT (deep venous thrombosis)     Not on Coumadin  . Anemia     Chronic  . Non-smoker   . Stroke   . Hypertension   . History of DVT (deep vein thrombosis) 03/06/2011  . Nursing home acquired MRSA infection 03/06/2011    History of MRSA in the left leg wound and  sacrum in the past-has been seen by vascular surgery and declined any type of intervention including arteriogram   . SEIZURE DISORDER 12/17/2006    Qualifier: Diagnosis of  By: Clent RidgesFry MD, Tera MaterStephen A     Medications:   (Not in a hospital admission) Assessment: [id number] YOF who presented to the ED with altered mental status. WBC is elevated at 16.7. LA 2.31. CXR negative for infiltrates. Pharmacy consulted to start empiric antibiotics for sepsis. SCr is acutely elevated at 2.8. CrCl ~ 20 mL/min.   Goal of Therapy:  Vancomycin trough level 15-20 mcg/ml Resolution of infection   Plan:  -Start Vancomycin 1 gm IV Q 48 hours  -Zosyn 3.375 gm IV Q 8 hours  -Monitor CBC, renal fx, cultures and patient's clinical progress -[facility]   Vinnie LevelBenjamin Jamielynn Wigley, PharmD.  Clinical Pharmacist Pager [phone]

## 2013-11-20 NOTE — ED Notes (Signed)
[facility], scant return, resps clearer.

## 2013-11-20 NOTE — ED Notes (Signed)
No changes, NSR 75, up on monitor to floor, family x4 present.

## 2013-11-20 NOTE — ED Notes (Signed)
Pt here from [facility] for aloc . Pt was seen lsn at 1300. Pt was only responsive to pain on scene, pt threw up in route to ed , pt moaning upon arrival cbg was low at nursing home 212 cbg by ems ,

## 2013-11-20 NOTE — ED Notes (Signed)
Pt resting, sleeping, stuporous on venti mask. VSS. Pt arousable to voice and physical stimuli. Non-verbal. intermittant sporadic grunting/moaning, no dyspnea noted. Urine obtained by I&O and sent. [facility]

## 2013-11-20 NOTE — ED Notes (Signed)
Pt did slowly open eyes and smiled at sone after dose of narcan

## 2013-11-20 NOTE — ED Notes (Signed)
No changes. Pt to CT. Will return to D34, family moved to new room.

## 2013-11-20 NOTE — ED Notes (Signed)
Family at bedside , pt still on NRB, will not open eyes to voice or pain , does not follow commands and is not responding to painful stimuli

## 2013-11-20 NOTE — ED Notes (Signed)
Pt opened eyes and looked at family and smiled after dose of Narcan , Fentanyl  Patch removed

## 2013-11-20 NOTE — Progress Notes (Signed)
Unit CM UR Completed by [facility] ED CM  W. Vanellope Passmore RN  

## 2013-11-20 NOTE — ED Notes (Addendum)
Pt ready for CT, CT contacted.

## 2013-11-20 NOTE — ED Notes (Signed)
Pt vomited times 1 while x ray tech in room shooting x ray , pt suctioned and repositioned upright

## 2013-11-20 NOTE — ED Notes (Signed)
Pt back from CT, no changes, [facility]

## 2013-11-20 NOTE — ED Provider Notes (Signed)
CSN: [id number]     Arrival date & time 11/20/13  1701 History   None    Chief Complaint  Patient presents with  . Altered Mental Status     (Consider location/radiation/quality/duration/timing/severity/associated sxs/prior Treatment) Patient is a 68 y.o. female presenting with altered mental status.  Altered Mental Status Presenting symptoms: partial responsiveness   Severity:  Severe Most recent episode:  Today Duration:  4 hours Progression:  Unchanged Chronicity:  New Context: nursing home resident   Associated symptoms: vomiting     Past Medical History  Diagnosis Date  . 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 Diabetes mellitus     Prone to hypoglycemia  . Aphagia     Status post numerous  . Dysphagia   . Peripheral vascular disease, unspecified   . Arterial disease   . Depressive disorder   . Hypothyroid   . Seizures   . MRSA infection     History of MRSA in the left leg wound and sacrum in the past-has been seen by vascular surgery and declined any type of intervention including arteriogram  . Diverticulosis     GI bleed in 2007-s/p endoscopy and colonoscopy in 2007 s/p massive GIB consittent with Diveerticulosis  . DVT (deep venous thrombosis)     Not on Coumadin  . Anemia     Chronic  . Non-smoker   . Stroke   . Hypertension   . History of DVT (deep vein thrombosis) 03/06/2011  . Nursing home acquired MRSA infection 03/06/2011    History of MRSA in the left leg wound and sacrum in the past-has been seen by vascular surgery and declined any type of intervention including arteriogram   . SEIZURE DISORDER 12/17/2006    Qualifier: Diagnosis of  By: Clent RidgesFry MD, Tera MaterStephen A    Past Surgical History  Procedure Laterality Date  . Above knee leg amputation    . Femoral-popliteal bypass graft     History reviewed. No pertinent family  history. History  Substance Use Topics  . Smoking status: Never Smoker   . Smokeless tobacco: Not on file  . Alcohol Use: No   OB History   Grav Para Term Preterm Abortions TAB SAB Ect Mult Living                 Review of Systems  Unable to perform ROS: Mental status change  Gastrointestinal: Positive for vomiting.      Allergies  Review of patient's allergies indicates no known allergies.  Home Medications   Prior to Admission medications   Medication Sig Start Date End Date Taking? Authorizing Provider  Amino Acids-Protein Hydrolys (FEEDING SUPPLEMENT, PRO-STAT SUGAR FREE 64,) LIQD Take 30 mLs by mouth every morning.   Yes Historical Provider, MD  amLODipine (NORVASC) 10 MG tablet Take 10 mg by mouth every morning.   Yes Historical Provider, MD  aspirin 81 MG tablet Take 324 mg by mouth every morning.    Yes Historical Provider, MD  cloNIDine (CATAPRES - DOSED IN MG/24 HR) 0.2 mg/24hr patch Place 1 patch onto the skin once a week.   Yes Historical Provider, MD  fentaNYL (DURAGESIC - DOSED MCG/HR) 50 MCG/HR Place 1 patch (50 mcg total) onto the skin every 3 (three) days. 10/27/13  Yes Kimber RelicArthur G Green, MD  fish oil-omega-3 fatty acids 1000 MG capsule Take 1 g  by mouth 2 (two) times daily.    Yes Historical Provider, MD  furosemide (LASIX) 40 MG tablet Take 40 mg by mouth every morning.   Yes Historical Provider, MD  gemfibrozil (LOPID) 600 MG tablet Take 600 mg by mouth 2 (two) times daily before a meal.   Yes Historical Provider, MD  insulin aspart (NOVOLOG FLEXPEN) 100 UNIT/ML FlexPen Inject 7-10 Units into the skin daily with supper. 10 units with breakfast and lunch, 7 units with supper, hold for CBG < 80   Yes Historical Provider, MD  insulin glargine (LANTUS) 100 UNIT/ML injection Inject 66 Units into the skin daily at 12 noon.    Yes Historical Provider, MD  levothyroxine (SYNTHROID, LEVOTHROID) 88 MCG tablet Take 88 mcg by mouth daily. 4 pm   Yes Historical Provider, MD   linagliptin (TRADJENTA) 5 MG TABS tablet Take 5 mg by mouth every morning.    Yes Historical Provider, MD  LORazepam (ATIVAN) 0.5 MG tablet Take 0.5 mg by mouth every 8 (eight) hours as needed for anxiety.   Yes Historical Provider, MD  losartan (COZAAR) 25 MG tablet Take 25 mg by mouth 2 (two) times daily.   Yes Historical Provider, MD  metoprolol tartrate (LOPRESSOR) 25 MG tablet Take 25 mg by mouth 2 (two) times daily. 03/08/11  Yes Russella Dar, NP  mirabegron ER (MYRBETRIQ) 50 MG TB24 tablet Take 50 mg by mouth every morning.    Yes Historical Provider, MD  oxyCODONE (OXY IR/ROXICODONE) 5 MG immediate release tablet Take two tablets by mouth every four hours as needed for pain 11/16/13  Yes Tiffany L Reed, DO  polyethylene glycol (MIRALAX / GLYCOLAX) packet Take 17 g by mouth every morning. Mix in 8 ounces of water; for constipation   Yes Historical Provider, MD  pregabalin (LYRICA) 50 MG capsule Take 50 mg by mouth 3 (three) times daily.   Yes Historical Provider, MD  rosuvastatin (CRESTOR) 40 MG tablet Take 40 mg by mouth at bedtime.    Yes Historical Provider, MD  sennosides-docusate sodium (SENOKOT-S) 8.6-50 MG tablet Take 2 tablets by mouth every morning.   Yes Historical Provider, MD  zolpidem (AMBIEN) 5 MG tablet Take 2.5 mg by mouth at bedtime as needed for sleep.   Yes Historical Provider, MD  acetaminophen (TYLENOL) 325 MG tablet Take 650 mg by mouth every 6 (six) hours as needed for fever (pain).     Historical Provider, MD  nitroGLYCERIN (NITROSTAT) 0.4 MG SL tablet Place 0.4 mg under the tongue every 5 (five) minutes x 3 doses as needed for chest pain. For chest pain    Historical Provider, MD   BP 136/107  Pulse 100  Temp(Src) 99 F (37.2 C) (Oral)  Resp 17  Ht 5\' 3"  (1.6 m)  Wt 174 lb 13.2 oz (79.3 kg)  BMI 30.98 kg/m2  SpO2 97% Physical Exam  Constitutional: She is oriented to person, place, and time. She appears listless. She has a sickly appearance.  HENT:  Head:  Normocephalic and atraumatic.  Eyes: Pupils are equal, round, and reactive to light. Right eye exhibits no discharge. Left eye exhibits no discharge.  Neck: Normal range of motion.  Cardiovascular: Normal rate, regular rhythm and normal heart sounds.   Pulmonary/Chest: Tachypnea noted. She has rhonchi. She has rales.  Abdominal: Soft. She exhibits no distension. There is no tenderness.  Musculoskeletal: Normal range of motion.  Neurological: She is oriented to person, place, and time. She appears listless. GCS eye subscore is  4. GCS verbal subscore is 2. GCS motor subscore is 4.  Skin: She is diaphoretic. There is pallor.    ED Course  Procedures (including critical care time) Labs Review Labs Reviewed  CBC WITH DIFFERENTIAL - Abnormal; Notable for the following:    WBC 16.7 (*)    Neutro Abs 11.6 (*)    Monocytes Absolute 1.1 (*)    All other components within normal limits  COMPREHENSIVE METABOLIC PANEL - Abnormal; Notable for the following:    Potassium 3.5 (*)    Glucose, Bld 182 (*)    BUN 32 (*)    Creatinine, Ser 2.80 (*)    Albumin 3.2 (*)    AST 68 (*)    Alkaline Phosphatase 131 (*)    Total Bilirubin <0.2 (*)    GFR calc non Af Amer 17 (*)    GFR calc Af Amer 19 (*)    Anion gap 20 (*)    All other components within normal limits  URINALYSIS, ROUTINE W REFLEX MICROSCOPIC - Abnormal; Notable for the following:    APPearance CLOUDY (*)    Glucose, UA 100 (*)    Hgb urine dipstick LARGE (*)    Protein, ur >300 (*)    All other components within normal limits  PRO B NATRIURETIC PEPTIDE - Abnormal; Notable for the following:    Pro B Natriuretic peptide (BNP) 832.1 (*)    All other components within normal limits  BLOOD GAS, ARTERIAL - Abnormal; Notable for the following:    pH, Arterial 7.291 (*)    pCO2 arterial 48.6 (*)    pO2, Arterial 56.8 (*)    Acid-base deficit 2.9 (*)    All other components within normal limits  CBG MONITORING, ED - Abnormal; Notable  for the following:    Glucose-Capillary 143 (*)    All other components within normal limits  I-STAT CG4 LACTIC ACID, ED - Abnormal; Notable for the following:    Lactic Acid, Venous 2.31 (*)    All other components within normal limits  CBG MONITORING, ED - Abnormal; Notable for the following:    Glucose-Capillary 149 (*)    All other components within normal limits  CBG MONITORING, ED - Abnormal; Notable for the following:    Glucose-Capillary 103 (*)    All other components within normal limits  MRSA PCR SCREENING  CULTURE, BLOOD (ROUTINE X 2)  CULTURE, BLOOD (ROUTINE X 2)  URINE CULTURE  URINE MICROSCOPIC-ADD ON  LACTIC ACID, PLASMA  MAGNESIUM  GLUCOSE, CAPILLARY  GLUCOSE, CAPILLARY  COMPREHENSIVE METABOLIC PANEL  CBC WITH DIFFERENTIAL  HEMOGLOBIN A1C  BLOOD GAS, ARTERIAL  I-STAT TROPOININ, ED    Imaging Review Ct Head Wo Contrast  11/20/2013   CLINICAL DATA:  Loss of consciousness, history of stroke  EXAM: CT HEAD WITHOUT CONTRAST  TECHNIQUE: Contiguous axial images were obtained from the base of the skull through the vertex without intravenous contrast.  COMPARISON:  07/13/2007  FINDINGS: Mild to moderate diffuse atrophy. Moderate low attenuation in the deep white matter. Evidence of stable chronic lacunar infarct in the region of the bilateral basal ganglia. Tiny lacunar infarct deep periventricular white matter image number 18, also stable. Stable lacunar infarct in the pons. No evidence of vascular territory infarct. No evidence of hemorrhage or extra-axial fluid. No hydrocephalus. Calvarium is intact.  IMPRESSION: Chronic involutional change and chronic lacunar infarcts, stable from prior study. No acute findings.   Electronically Signed   By: Edgar Frisk.D.  On: 11/20/2013 20:02   Dg Chest Port 1 View  11/20/2013   CLINICAL DATA:  Altered mental status.  EXAM: PORTABLE CHEST - 1 VIEW  COMPARISON:  02/09/2012.  FINDINGS: The heart is upper limits of normal in size  and stable. There is tortuosity and calcification of the thoracic aorta. Diffuse increased interstitial markings suspicious for interstitial edema. No definite pleural effusions or focal infiltrates.  IMPRESSION: Chronic lung changes with possible superimposed interstitial edema. No infiltrates or effusions.   Electronically Signed   By: Loralie Champagne M.D.   On: 11/20/2013 17:42     EKG Interpretation None      MDM   Final diagnoses:  Altered mental status, unspecified altered mental status type   68 yo F with multiple medical problems as above, specifically DM, hypothyroid, prior DVTs, strokes, R AKA, who presents today with altered mental status as sent by her nursing home.   Upon arrival, patient is ill appearing. Her airway is intact, though she appears initially to be struggling with secretions. She has rhoncorous breath sounds. Her blood pressures are stable. Her GCS is 9. Patient arrives with DNR paperwork, though son at the bedside is upset with her decision to be DNR and wishes to have every intervention done at this time.   Patient worked up for altered mental status. Patient with fentanyl patch on, given narcan with improvement in mental status. CT head demonstrates no acute findings. XR with minimal interstitial edema. Labs demonstrate leukocytosis. Will admit to hospitalist stepdown unit for continued observation. Likely AMS 2/2 overmedication, will start narcan gtt per hospitalist recommendations. Admitted in guarded condition. Patient seen and evaluated by myself and my attending, Dr. Judd Lien.      Imagene Sheller, MD 11/21/13 1105

## 2013-11-21 ENCOUNTER — Inpatient Hospital Stay (HOSPITAL_COMMUNITY): Payer: Medicare Other

## 2013-11-21 DIAGNOSIS — N183 Chronic kidney disease, stage 3 unspecified: Secondary | ICD-10-CM | POA: Diagnosis present

## 2013-11-21 DIAGNOSIS — E1149 Type 2 diabetes mellitus with other diabetic neurological complication: Secondary | ICD-10-CM

## 2013-11-21 DIAGNOSIS — I469 Cardiac arrest, cause unspecified: Secondary | ICD-10-CM

## 2013-11-21 DIAGNOSIS — K7581 Nonalcoholic steatohepatitis (NASH): Secondary | ICD-10-CM | POA: Diagnosis present

## 2013-11-21 DIAGNOSIS — J9601 Acute respiratory failure with hypoxia: Secondary | ICD-10-CM | POA: Diagnosis present

## 2013-11-21 DIAGNOSIS — N179 Acute kidney failure, unspecified: Secondary | ICD-10-CM | POA: Diagnosis present

## 2013-11-21 DIAGNOSIS — I5033 Acute on chronic diastolic (congestive) heart failure: Secondary | ICD-10-CM | POA: Diagnosis present

## 2013-11-21 DIAGNOSIS — D638 Anemia in other chronic diseases classified elsewhere: Secondary | ICD-10-CM | POA: Diagnosis present

## 2013-11-21 DIAGNOSIS — R7989 Other specified abnormal findings of blood chemistry: Secondary | ICD-10-CM

## 2013-11-21 DIAGNOSIS — G9340 Encephalopathy, unspecified: Secondary | ICD-10-CM

## 2013-11-21 DIAGNOSIS — I5023 Acute on chronic systolic (congestive) heart failure: Secondary | ICD-10-CM | POA: Diagnosis present

## 2013-11-21 DIAGNOSIS — R778 Other specified abnormalities of plasma proteins: Secondary | ICD-10-CM | POA: Diagnosis present

## 2013-11-21 DIAGNOSIS — D72829 Elevated white blood cell count, unspecified: Secondary | ICD-10-CM | POA: Diagnosis present

## 2013-11-21 DIAGNOSIS — J9600 Acute respiratory failure, unspecified whether with hypoxia or hypercapnia: Secondary | ICD-10-CM

## 2013-11-21 LAB — COMPREHENSIVE METABOLIC PANEL
ALK PHOS: 113 U/L (ref 39–117)
ALT: 26 U/L (ref 0–35)
ALT: 28 U/L (ref 0–35)
AST: 65 U/L — ABNORMAL HIGH (ref 0–37)
AST: 69 U/L — ABNORMAL HIGH (ref 0–37)
Albumin: 2.9 g/dL — ABNORMAL LOW (ref 3.5–5.2)
Albumin: 3.1 g/dL — ABNORMAL LOW (ref 3.5–5.2)
Alkaline Phosphatase: 113 U/L (ref 39–117)
Anion gap: 17 — ABNORMAL HIGH (ref 5–15)
Anion gap: 25 — ABNORMAL HIGH (ref 5–15)
BUN: 36 mg/dL — ABNORMAL HIGH (ref 6–23)
BUN: 41 mg/dL — AB (ref 6–23)
CALCIUM: 9.3 mg/dL (ref 8.4–10.5)
CHLORIDE: 104 meq/L (ref 96–112)
CO2: 24 mEq/L (ref 19–32)
CO2: 17 meq/L — AB (ref 19–32)
Calcium: 8.8 mg/dL (ref 8.4–10.5)
Chloride: 106 mEq/L (ref 96–112)
Creatinine, Ser: 2.69 mg/dL — ABNORMAL HIGH (ref 0.50–1.10)
Creatinine, Ser: 2.34 mg/dL — ABNORMAL HIGH (ref 0.50–1.10)
GFR calc Af Amer: 20 mL/min — ABNORMAL LOW (ref >90)
GFR, EST AFRICAN AMERICAN: 24 mL/min — AB (ref >90)
GFR, EST NON AFRICAN AMERICAN: 17 mL/min — AB (ref >90)
GFR, EST NON AFRICAN AMERICAN: 21 mL/min — AB (ref >90)
GLUCOSE: 110 mg/dL — AB (ref 70–99)
GLUCOSE: 209 mg/dL — AB (ref 70–99)
POTASSIUM: 4 meq/L (ref 3.7–5.3)
Potassium: 4.2 mEq/L (ref 3.7–5.3)
SODIUM: 145 meq/L (ref 137–147)
SODIUM: 148 meq/L — AB (ref 137–147)
Total Bilirubin: 0.3 mg/dL (ref 0.3–1.2)
Total Protein: 7.6 g/dL (ref 6.0–8.3)
Total Protein: 8.3 g/dL (ref 6.0–8.3)

## 2013-11-21 LAB — CBC WITH DIFFERENTIAL/PLATELET
BASOS PCT: 0 % (ref 0–1)
Basophils Absolute: 0 10*3/uL (ref 0.0–0.1)
Basophils Absolute: 0 10*3/uL (ref 0.0–0.1)
Basophils Relative: 0 % (ref 0–1)
EOS ABS: 0 10*3/uL (ref 0.0–0.7)
EOS ABS: 0 10*3/uL (ref 0.0–0.7)
EOS PCT: 0 % (ref 0–5)
Eosinophils Relative: 0 % (ref 0–5)
HCT: 37.2 % (ref 36.0–46.0)
HCT: 39.8 % (ref 36.0–46.0)
HEMOGLOBIN: 12.1 g/dL (ref 12.0–15.0)
Hemoglobin: 11.5 g/dL — ABNORMAL LOW (ref 12.0–15.0)
LYMPHS ABS: 4.3 10*3/uL — AB (ref 0.7–4.0)
LYMPHS PCT: 30 % (ref 12–46)
Lymphocytes Relative: 15 % (ref 12–46)
Lymphs Abs: 1.8 10*3/uL (ref 0.7–4.0)
MCH: 27.4 pg (ref 26.0–34.0)
MCH: 27.3 pg (ref 26.0–34.0)
MCHC: 30.9 g/dL (ref 30.0–36.0)
MCHC: 30.4 g/dL (ref 30.0–36.0)
MCV: 88.8 fL (ref 78.0–100.0)
MCV: 89.6 fL (ref 78.0–100.0)
MONOS PCT: 6 % (ref 3–12)
MONOS PCT: 6 % (ref 3–12)
Monocytes Absolute: 0.7 10*3/uL (ref 0.1–1.0)
Monocytes Absolute: 0.8 10*3/uL (ref 0.1–1.0)
NEUTROS PCT: 64 % (ref 43–77)
Neutro Abs: 9.1 10*3/uL — ABNORMAL HIGH (ref 1.7–7.7)
Neutro Abs: 9 10*3/uL — ABNORMAL HIGH (ref 1.7–7.7)
Neutrophils Relative %: 79 % — ABNORMAL HIGH (ref 43–77)
PLATELETS: 268 10*3/uL (ref 150–400)
Platelets: 331 10*3/uL (ref 150–400)
RBC: 4.19 MIL/uL (ref 3.87–5.11)
RBC: 4.44 MIL/uL (ref 3.87–5.11)
RDW: 14.7 % (ref 11.5–15.5)
RDW: 15 % (ref 11.5–15.5)
WBC: 11.6 10*3/uL — ABNORMAL HIGH (ref 4.0–10.5)
WBC: 14.1 10*3/uL — ABNORMAL HIGH (ref 4.0–10.5)

## 2013-11-21 LAB — BLOOD GAS, ARTERIAL
ACID-BASE DEFICIT: 1.1 mmol/L (ref 0.0–2.0)
BICARBONATE: 24.4 meq/L — AB (ref 20.0–24.0)
Drawn by: 312761
O2 Content: 2 L/min
O2 SAT: 91 %
PATIENT TEMPERATURE: 100.1
TCO2: 25.9 mmol/L (ref 0–100)
pCO2 arterial: 52 mmHg — ABNORMAL HIGH (ref 35.0–45.0)
pH, Arterial: 7.297 — ABNORMAL LOW (ref 7.350–7.450)
pO2, Arterial: 69.7 mmHg — ABNORMAL LOW (ref 80.0–100.0)

## 2013-11-21 LAB — GLUCOSE, CAPILLARY
GLUCOSE-CAPILLARY: 141 mg/dL — AB (ref 70–99)
GLUCOSE-CAPILLARY: 303 mg/dL — AB (ref 70–99)
GLUCOSE-CAPILLARY: 197 mg/dL — AB (ref 70–99)
GLUCOSE-CAPILLARY: 257 mg/dL — AB (ref 70–99)
Glucose-Capillary: 113 mg/dL — ABNORMAL HIGH (ref 70–99)
Glucose-Capillary: 180 mg/dL — ABNORMAL HIGH (ref 70–99)
Glucose-Capillary: 195 mg/dL — ABNORMAL HIGH (ref 70–99)
Glucose-Capillary: 230 mg/dL — ABNORMAL HIGH (ref 70–99)

## 2013-11-21 LAB — POCT I-STAT 3, ART BLOOD GAS (G3+)
ACID-BASE DEFICIT: 3 mmol/L — AB (ref 0.0–2.0)
Bicarbonate: 21.6 mEq/L (ref 20.0–24.0)
O2 Saturation: 90 %
PCO2 ART: 37 mmHg (ref 35.0–45.0)
Patient temperature: 99.6
TCO2: 23 mmol/L (ref 0–100)
pH, Arterial: 7.375 (ref 7.350–7.450)
pO2, Arterial: 62 mmHg — ABNORMAL LOW (ref 80.0–100.0)

## 2013-11-21 LAB — HEMOGLOBIN A1C
Hgb A1c MFr Bld: 9.5 % — ABNORMAL HIGH (ref <5.7)
MEAN PLASMA GLUCOSE: 226 mg/dL — AB (ref <117)

## 2013-11-21 LAB — PROTIME-INR
INR: 1.18 (ref 0.00–1.49)
PROTHROMBIN TIME: 15 s (ref 11.6–15.2)

## 2013-11-21 LAB — AMMONIA: Ammonia: 40 umol/L (ref 11–60)

## 2013-11-21 LAB — LACTIC ACID, PLASMA: Lactic Acid, Venous: 4 mmol/L — ABNORMAL HIGH (ref 0.5–2.2)

## 2013-11-21 LAB — PHOSPHORUS: PHOSPHORUS: 5.4 mg/dL — AB (ref 2.3–4.6)

## 2013-11-21 LAB — TSH: TSH: 1.37 u[IU]/mL (ref 0.350–4.500)

## 2013-11-21 LAB — TROPONIN I: TROPONIN I: 1.35 ng/mL — AB (ref <0.30)

## 2013-11-21 LAB — APTT: aPTT: 34 seconds (ref 24–37)

## 2013-11-21 LAB — MAGNESIUM: Magnesium: 2.3 mg/dL (ref 1.5–2.5)

## 2013-11-21 MED ORDER — LABETALOL HCL 5 MG/ML IV SOLN
10.0000 mg | INTRAVENOUS | Status: DC | PRN
  Administered 2013-11-21 – 2013-11-22 (×5): 10 mg via INTRAVENOUS
  Filled 2013-11-21 (×4): qty 4

## 2013-11-21 MED ORDER — BIOTENE DRY MOUTH MT LIQD
15.0000 mL | Freq: Two times a day (BID) | OROMUCOSAL | Status: DC
  Administered 2013-11-21: 15 mL via OROMUCOSAL

## 2013-11-21 MED ORDER — AMLODIPINE BESYLATE 5 MG PO TABS
5.0000 mg | ORAL_TABLET | Freq: Every day | ORAL | Status: DC
  Filled 2013-11-21 (×2): qty 1

## 2013-11-21 MED ORDER — FUROSEMIDE 10 MG/ML IJ SOLN
INTRAMUSCULAR | Status: AC
  Filled 2013-11-21: qty 4

## 2013-11-21 MED ORDER — HEPARIN (PORCINE) IN NACL 100-0.45 UNIT/ML-% IJ SOLN
1000.0000 [IU]/h | INTRAMUSCULAR | Status: DC
  Administered 2013-11-21: 1000 [IU]/h via INTRAVENOUS
  Filled 2013-11-21 (×2): qty 250

## 2013-11-21 MED ORDER — ACETAMINOPHEN 650 MG RE SUPP
650.0000 mg | Freq: Once | RECTAL | Status: AC
  Administered 2013-11-21: 650 mg via RECTAL
  Filled 2013-11-21: qty 1

## 2013-11-21 MED ORDER — SODIUM CHLORIDE 0.9 % IV SOLN
INTRAVENOUS | Status: DC | PRN
  Administered 2013-11-22: 03:00:00 via INTRAVENOUS

## 2013-11-21 MED ORDER — INSULIN GLARGINE 100 UNIT/ML ~~LOC~~ SOLN
30.0000 [IU] | Freq: Every day | SUBCUTANEOUS | Status: DC
  Filled 2013-11-21: qty 0.3

## 2013-11-21 MED ORDER — DEXTROSE 5 % IV SOLN
0.2500 mg/h | INTRAVENOUS | Status: DC
  Administered 2013-11-21: 0.25 mg/h via INTRAVENOUS
  Filled 2013-11-21 (×2): qty 4

## 2013-11-21 MED ORDER — ASPIRIN 81 MG PO CHEW: 81.0000 mg | CHEWABLE_TABLET | Freq: Every day | ORAL | Status: DC

## 2013-11-21 MED ORDER — HEPARIN BOLUS VIA INFUSION
4000.0000 [IU] | Freq: Once | INTRAVENOUS | Status: AC
  Administered 2013-11-21: 4000 [IU] via INTRAVENOUS
  Filled 2013-11-21: qty 4000

## 2013-11-21 MED ORDER — HYDRALAZINE HCL 20 MG/ML IJ SOLN
10.0000 mg | Freq: Four times a day (QID) | INTRAMUSCULAR | Status: DC | PRN
  Administered 2013-11-21 (×3): 10 mg via INTRAVENOUS
  Filled 2013-11-21 (×3): qty 1

## 2013-11-21 MED ORDER — ASPIRIN 300 MG RE SUPP
300.0000 mg | Freq: Once | RECTAL | Status: AC
  Administered 2013-11-21: 300 mg via RECTAL
  Filled 2013-11-21: qty 1

## 2013-11-21 MED ORDER — METOPROLOL TARTRATE 1 MG/ML IV SOLN
5.0000 mg | Freq: Four times a day (QID) | INTRAVENOUS | Status: DC
  Administered 2013-11-21: 5 mg via INTRAVENOUS
  Filled 2013-11-21: qty 5

## 2013-11-21 MED ORDER — SODIUM CHLORIDE 0.9 % IJ SOLN
3.0000 mL | INTRAMUSCULAR | Status: DC | PRN
  Administered 2013-11-28: 3 mL via INTRAVENOUS

## 2013-11-21 MED ORDER — NALOXONE HCL 0.4 MG/ML IJ SOLN
INTRAMUSCULAR | Status: AC
  Administered 2013-11-21: 0.4 mg via INTRAVENOUS
  Filled 2013-11-21: qty 1

## 2013-11-21 MED ORDER — FUROSEMIDE 10 MG/ML IJ SOLN: 40.0000 mg | Freq: Four times a day (QID) | INTRAMUSCULAR | Status: DC

## 2013-11-21 NOTE — Progress Notes (Signed)
ANTICOAGULATION CONSULT NOTE - Follow Up Consult  Pharmacy Consult for Heparin Indication: chest pain/ACS  No Known Allergies  Patient Measurements: Height: 5\' 3"  (160 cm) Weight: 174 lb 13.2 oz (79.3 kg) IBW/kg (Calculated) : 52.4 Heparin Dosing Weight: 70 kg  Vital Signs: Temp: 99.6 F (37.6 C) (07/25 1700) Temp src: Axillary (07/25 1700) BP: 193/73 mmHg (07/25 2030) Pulse Rate: 116 (07/25 2030)  Labs:  Recent Labs  11/20/13 1705 11/21/13 0257 11/21/13 1900  HGB 12.5 11.5*  --   HCT 40.9 37.2  --   PLT 326 268  --   CREATININE 2.80* 2.69*  --   TROPONINI  --   --  1.35*    Estimated Creatinine Clearance: 20.8 ml/min (by C-G formula based on Cr of 2.69).   Medications:  Infusions:  . naLOXone (NARCAN) adult infusion for OVERDOSE 0.25 mg/hr (11/21/13 1954)    Assessment: 68 year old female with altered mental status being treated for pneumonia and possible narcotic-related encephalopathy with Narcan, now found to have NSTEMI with elevated troponin. She is to begin anticoagulation with heparin.  Goal of Therapy:  Heparin level 0.3-0.7 units/ml Monitor platelets by anticoagulation protocol: Yes   Plan:  Heparin 4000 units IV bolus x 1 Start Heparin infusion at 1000 units/hr Check heparin level and CBC in 8 hours and daily Will check baseline PT, PTT  Estella HuskMichelle Mayfield Schoene, Pharm.D., BCPS, AAHIVP Clinical Pharmacist Phone: [phone] or [phone] 11/21/2013, 8:46 PM

## 2013-11-21 NOTE — Progress Notes (Addendum)
Came back to see patient.  Patient became bradycardic and was foaming at the mouth.  Patient was placed back on narcan gtt and non-re breather.  Lung sound course and with crackles- will give lasix IV now and scheduled lasix.  Will place palliative care consult to help with goals of care.  Patient has DNR in place currently but family states that those were not her wishes and she would like to be full code.  Patient is unable to relay information currently.  Spoke with son and daughter at bedside that intubation and CPR are not in the patient's best interests nor will fix the underlying condition but they state the patient would want it.  Will get chest x ray, give lasix, cycle CE and restart narcan gtt- continue abx Foley placed for strict I/Os Will update cross coverage to follow up on labs/studies-   critical care consult.  Marlin CanaryJessica Vann

## 2013-11-21 NOTE — Progress Notes (Signed)
CODE BLUE NOTE  Patient Name: Allison Washington   MRN: [id number]   Date of Birth/ Sex: 21-Jun-1945 , female      Admission Date: 11/20/2013  Attending Provider: Joseph ArtJessica U Vann, DO  Primary Diagnosis: <principal problem not specified>    Indication: Pt was in her usual state of health until this PM, when she was noted to be bradycardic and foaming at mouth. Code blue was subsequently called. At the time of arrival on scene, ACLS protocol was underway.  Pt is DNR, so no medications or compressions were delivered.    Technical Description:  - CPR performance duration:  0  minutes  - Was defibrillation or cardioversion used? No   - Was external pacer placed? No  - Was patient intubated pre/post CPR? No    Medications Administered: Y = Yes; Blank = No Amiodarone    Atropine    Calcium    Epinephrine    Lidocaine    Magnesium    Norepinephrine    Phenylephrine    Sodium bicarbonate    Vasopressin      Post CPR evaluation:  - Final Status - Was patient successfully resuscitated ? Yes - What is current rhythm? Sinus tach - What is current hemodynamic status? Stable   Miscellaneous Information:  - Labs sent, including: Pending primary team decision  - Primary team notified?  Yes  - Family Notified? Yes  - Additional notes/ transfer status:  Suctioning of mouth and throat by respiratory therapy.  Primary team on the way to see patient        Shirlee LatchAngela Greg Eckrich, MD  11/21/2013, 6:43 PM

## 2013-11-21 NOTE — Progress Notes (Signed)
Spoke with family re: baseline speech. They stated that normally it would be very difficult for a person not around the patient to understand her speech pattern.  Also, her secretions are constant and sometimes gets choked while eating.

## 2013-11-21 NOTE — ED Provider Notes (Signed)
I saw and evaluated the patient, reviewed the resident's note and I agree with the findings and plan. Patient is a 68 year old female with extensive past medical history including diabetes, peripheral vascular disease, CVA. She was sent for evaluation of decreased mental status that occurred acutely prior to arrival. She was found to be less responsive, more confused, and spitting up watery liquid. She is unable to provide much additional history due to her baseline and new medical condition.  On exam, vitals are stable and she is afebrile. Her oxygen saturations are 97% on supplemental oxygen. She appears confused, pale, and somewhat diaphoretic. She is chronically and acutely ill-appearing. Head is atraumatic, normocephalic. Neck is supple. Heart is tachycardic but there are no murmurs. Lungs are noted to have rales in the bases bilaterally. Abdomen is soft, nontender. Patient is status post right above-the-knee amputation, however the left leg appears slightly edematous with palpable pulses.  The patient arrived here with difficulty breathing. She is either coughing or vomiting clear fluid. Workup reveals either aspiration or pulmonary edema on her chest x-ray. Patient does have a fentanyl patch and her unresponsiveness did seem to improve somewhat with Narcan. She was ultimately started on a Narcan drip in the fentanyl patch was removed. Workup reveals a leukocytosis on CBC, and interstitial edema on chest x-ray. I am uncertain as to if this is a flash pulmonary edema or aspiration, however she will be admitted to the hospitalist service for further evaluation.   EKG Interpretation None        Geoffery Lyonsouglas Vina Byrd, MD 11/21/13 [phone]

## 2013-11-21 NOTE — Progress Notes (Signed)
EEG completed; results pending.    

## 2013-11-21 NOTE — Progress Notes (Signed)
PROGRESS NOTE  Allison Washington [id number] DOB: [birth year] DOA: 11/20/2013 PCP: Kimber RelicGREEN, ARTHUR G, MD  Assessment/Plan: Encephalopathy:  -Likely multifactorial with contributions including toxic/metabolic etiologies +/- ? Infection, medication. -Fentanyl patch removed and placed on Narcan gtt.  -vanc and zosyn.  -Pan culture.  -EEG.  -MRI to rule CVA recurrence. -ammonia level   Acute Resp failure:  -Transient improvement with narcan administration.  -CXR negative for PNA currently but does show fluid- patient though high risk for aspiration event.    leukocytosis:  -Pan culture.  -Broad spectrum abx.   AKI on CKD -monitor -stop IVF for now as h/o CHF  IDDM:  -SSI. -lantus at a lower dose   HTN:  clonidine patch.  Hold oral meds.  Lactic acidosis -resolved   Code Status: DNR Family Communication: patient; called family to find out baseline, no answer Disposition Plan: admit   Consultants:  none  Procedures:      HPI/Subjective: Patient awake Not speaking but appears to be signing name  Objective: Filed Vitals:   11/21/13 0619  BP: 164/57  Pulse: 93  Temp:   Resp: 13    Intake/Output Summary (Last 24 hours) at 11/21/13 0818 Last data filed at 11/21/13 0600  Gross per 24 hour  Intake 1337.02 ml  Output      0 ml  Net 1337.02 ml   Filed Weights   11/20/13 2006 11/20/13 2200 11/21/13 0300  Weight: 77.6 kg (171 lb 1.2 oz) 79.3 kg (174 lb 13.2 oz) 79.3 kg (174 lb 13.2 oz)    Exam:   General:  Awake but not speaking- not sure of baseline- old notes say aphasic, nursing home notes say A+Ox3; lots of secreations  Cardiovascular: rrr  Respiratory: no wheezing, upper airway sounds  Abdomen: +BS, obese  Musculoskeletal: R BKA   Data Reviewed: Basic Metabolic Panel:  Recent Labs Lab 11/20/13 1705 11/20/13 2238 11/21/13 0257  NA 141  --  145  K 3.5*  --  4.0  CL 100  --  104  CO2 21  --  24  GLUCOSE 182*  --  110*  BUN 32*   --  36*  CREATININE 2.80*  --  2.69*  CALCIUM 9.3  --  8.8  MG  --  2.3  --    Liver Function Tests:  Recent Labs Lab 11/20/13 1705 11/21/13 0257  AST 68* 65*  ALT 27 26  ALKPHOS 131* 113  BILITOT <0.2* <0.2*  PROT 8.3 7.6  ALBUMIN 3.2* 2.9*   No results found for this basename: LIPASE, AMYLASE,  in the last 168 hours No results found for this basename: AMMONIA,  in the last 168 hours CBC:  Recent Labs Lab 11/20/13 1705 11/21/13 0257  WBC 16.7* 11.6*  NEUTROABS 11.6* 9.1*  HGB 12.5 11.5*  HCT 40.9 37.2  MCV 90.7 88.8  PLT 326 268   Cardiac Enzymes: No results found for this basename: CKTOTAL, CKMB, CKMBINDEX, TROPONINI,  in the last 168 hours BNP (last 3 results)  Recent Labs  11/20/13 1705  PROBNP 832.1*   CBG:  Recent Labs Lab 11/20/13 1719 11/20/13 2059 11/20/13 2153 11/20/13 2317 11/21/13 0346  GLUCAP 149* 103* 80 84 113*    Recent Results (from the past 240 hour(s))  MRSA PCR SCREENING     Status: None   Collection Time    11/20/13  9:52 PM      Result Value Ref Range Status   MRSA by PCR NEGATIVE  NEGATIVE Final  Comment:            The GeneXpert MRSA Assay (FDA     approved for NASAL specimens     only), is one component of a     comprehensive MRSA colonization     surveillance program. It is not     intended to diagnose MRSA     infection nor to guide or     monitor treatment for     MRSA infections.     Studies: Ct Head Wo Contrast  11/20/2013   CLINICAL DATA:  Loss of consciousness, history of stroke  EXAM: CT HEAD WITHOUT CONTRAST  TECHNIQUE: Contiguous axial images were obtained from the base of the skull through the vertex without intravenous contrast.  COMPARISON:  07/13/2007  FINDINGS: Mild to moderate diffuse atrophy. Moderate low attenuation in the deep white matter. Evidence of stable chronic lacunar infarct in the region of the bilateral basal ganglia. Tiny lacunar infarct deep periventricular white matter image number 18,  also stable. Stable lacunar infarct in the pons. No evidence of vascular territory infarct. No evidence of hemorrhage or extra-axial fluid. No hydrocephalus. Calvarium is intact.  IMPRESSION: Chronic involutional change and chronic lacunar infarcts, stable from prior study. No acute findings.   Electronically Signed   By: Esperanza Heir M.D.   On: 11/20/2013 20:02   Dg Chest Port 1 View  11/20/2013   CLINICAL DATA:  Altered mental status.  EXAM: PORTABLE CHEST - 1 VIEW  COMPARISON:  02/09/2012.  FINDINGS: The heart is upper limits of normal in size and stable. There is tortuosity and calcification of the thoracic aorta. Diffuse increased interstitial markings suspicious for interstitial edema. No definite pleural effusions or focal infiltrates.  IMPRESSION: Chronic lung changes with possible superimposed interstitial edema. No infiltrates or effusions.   Electronically Signed   By: Loralie Champagne M.D.   On: 11/20/2013 17:42    Scheduled Meds: . [START ON 11/25/2013] cloNIDine  0.2 mg Transdermal Weekly  . heparin  5,000 Units Subcutaneous 3 times per day  . insulin aspart  0-9 Units Subcutaneous 6 times per day  . insulin glargine  30 Units Subcutaneous Q1200  . piperacillin-tazobactam (ZOSYN)  IV  3.375 g Intravenous Q8H  . sodium chloride  3 mL Intravenous Q12H  . vancomycin  1,000 mg Intravenous Q48H   Continuous Infusions: . naLOXone (NARCAN) adult infusion for OVERDOSE 0.25 mg/hr (11/21/13 0100)   Antibiotics Given (last 72 hours)   Date/Time Action Medication Dose Rate   11/21/13 0044 Given  [medication unavailable]   vancomycin (VANCOCIN) IVPB 1000 mg/200 mL premix 1,000 mg 200 mL/hr   11/21/13 0516 Given   piperacillin-tazobactam (ZOSYN) IVPB 3.375 g 3.375 g 12.5 mL/hr      Active Problems:   Sepsis   Encephalopathy    Time spent: 35 min    Maggie Dworkin  [facility] Pager [phone] If 7PM-7AM, please contact night-coverage at [email or web address], password  [facility] 11/21/2013, 8:18 AM  LOS: 1 day

## 2013-11-21 NOTE — Progress Notes (Signed)
Dr. Benjamine MolaVann notified of family's desire to speak with her re:  Test results.

## 2013-11-21 NOTE — Procedures (Signed)
ELECTROENCEPHALOGRAM REPORT  Patient: Allison Washington       Room #: 2H20 EEG No. ID: [id number] Age: 68 y.o.        Sex: female Referring Physician: Benjamine MolaVann Report Date:  11/21/2013        Interpreting Physician: Aline BrochureSTEWART,Tishara Pizano R  History: Allison Nashatricia Kempa is an 68 y.o. female admitted with acute encephalopathic state with unresponsiveness. Etiology is likely multifactorial including toxic/metabolic as well as possible infectious etiology and sedating medications.  Indications for study:  Assess severity of encephalopathy; rule out seizure activity.  Technique: This is an 18 channel routine scalp EEG performed at the bedside with bipolar and monopolar montages arranged in accordance to the international 10/20 system of electrode placement.   Description: The predominant background activity consists of low amplitude diffuse 1-2 Hz delta activity with superimposed 5-6 Hz diffuse theta activity of moderate amplitude. Photic stimulation was not performed. No epileptiform discharges were recorded.  Interpretation: This EEG is abnormal with moderate generalized continuous nonspecific slowing of cerebral activity. This pattern of slowing can be seen with a wide variety of encephalopathies, including metabolic and toxic encephalopathies, as well as degenerative CNS disorders. No evidence of an epileptic disorder was demonstrated.   Venetia MaxonR Zedrick Springsteen M.D. [facility] [phone]

## 2013-11-21 NOTE — Evaluation (Signed)
Clinical/Bedside Swallow Evaluation Patient Details  Name: Allison Washington MRN: [id number] Date of Birth: 10/31/1945  Today's Date: 11/21/2013 Time: 1000-1035 SLP Time Calculation (min): 35 min  Past Medical History:  Past Medical History  Diagnosis Date  . 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 Diabetes mellitus     Prone to hypoglycemia  . Aphagia     Status post numerous  . Dysphagia   . Peripheral vascular disease, unspecified   . Arterial disease   . Depressive disorder   . Hypothyroid   . Seizures   . MRSA infection     History of MRSA in the left leg wound and sacrum in the past-has been seen by vascular surgery and declined any type of intervention including arteriogram  . Diverticulosis     GI bleed in 2007-s/p endoscopy and colonoscopy in 2007 s/p massive GIB consittent with Diveerticulosis  . DVT (deep venous thrombosis)     Not on Coumadin  . Anemia     Chronic  . Non-smoker   . Stroke   . Hypertension   . History of DVT (deep vein thrombosis) 03/06/2011  . Nursing home acquired MRSA infection 03/06/2011    History of MRSA in the left leg wound and sacrum in the past-has been seen by vascular surgery and declined any type of intervention including arteriogram   . SEIZURE DISORDER 12/17/2006    Qualifier: Diagnosis of  By: Clent Ridges MD, Tera Mater    Past Surgical History:  Past Surgical History  Procedure Laterality Date  . Above knee leg amputation    . Femoral-popliteal bypass graft     HPI:  Allison Washington adm to [facility] with AMS, diagnosed with sepsis.  Pt has h/o CVA with expressive aphasia/motor planning issues, dysphagia, dysarthria and right hemiparesis.  CXR negative - chronic lung changes.  Pt for MRI, CT head and repeat CXR today.  Allison Washington resides in SNF but premorbid diet not listed in med rec form.  Swallow evaluation ordered.     Assessment / Plan / Recommendation Clinical Impression  Pt currently is aspirating secretions without ability to clear evidenced by congested breathing quality.  Her ability to consume po is further limited by her apraxia impairing oral transit severely.  Pt also not able to conduct a volitional cough/throat clear.  Allison Washington is grossly weak with lingual protrusion noted with and without swallow attempts.  Oral deficits appear much more exacerbated than previously resulting in excessive delays in oral transiting.  Post swallow worsening congested breathing quality, likely due to aspiration of melted ice/water.  When pt instructed to move ice bolus around with her tongue Allison Washington moved her head (not her tongue) to try to aid melting.    Recommend strict NPO with aggressive oral care = SLP to follow clinically for readiness for instrumental swallow evaluation.  Do not anticipate pt would tolerate FEES, therefore MBS may be most appropriate test.  SLP provided pt with communication board and had her demonstrate use with accuracy for item assessed.     Educated pt to findings and goal being for management of secretions and improved initiation of swallow to aid in po readiness determination.  Pt was frustrated by findings of evaluation, shaking her fist at SLP, but appeared comforted by assurance that we will keep her mouth moist and comfortable.      Aspiration  Risk  Severe    Diet Recommendation NPO   Medication Administration: Via alternative means    Other  Recommendations Oral Care Recommendations: Oral care Q4 per protocol   Follow Up Recommendations  Skilled Nursing facility    Frequency and Duration min 2x/week  2 weeks   Pertinent Vitals/Pain Low grade fever, congested      Swallow Study Prior Functional Status   see HHX    General Date of Onset: 11/21/13 HPI: Allison Washington [facility], diagnosed with sepsis.  Pt has h/o CVA with expressive aphasia/motor planning issues,  dysphagia, dysarthria and right hemiparesis.  CXR negative - chronic lung changes.  Pt for MRI, CT head and repeat CXR today.  Allison Washington resides in SNF but premorbid diet not listed in med rec form.  Swallow evaluation ordered.  Type of Study: Bedside swallow evaluation Previous Swallow Assessment: Multiple MBS studies over many years, 10/12/2008 last one - no results, esophagram 03/2010 filling deficit in mid esophagus, possible neoplasm.   Diet Prior to this Study: NPO Temperature Spikes Noted: Yes (low grade) Respiratory Status: Nasal cannula History of Recent Intubation: No Behavior/Cognition: Alert;Other (comment) (pt with expressive aphasia but able to use comm board ) Oral Cavity - Dentition: Edentulous (dentures not placed due to pt's difficulties with secretion management) Self-Feeding Abilities: Total assist Patient Positioning: Upright in bed Baseline Vocal Quality: Breathy;Wet (no phonation observed like due to apraxia) Volitional Cough: Cognitively unable to elicit (likely due to apraxia) Volitional Swallow: Unable to elicit    Oral/Motor/Sensory Function Overall Oral Motor/Sensory Function: Impaired at baseline Labial ROM: Reduced right Labial Symmetry: Abnormal symmetry right Labial Strength: Reduced Labial Sensation: Reduced Lingual ROM: Reduced right Lingual Symmetry: Abnormal symmetry right Lingual Strength: Reduced Lingual Sensation: Reduced Facial ROM: Reduced right (pt declined to smile with max cues) Facial Strength: Reduced Velum:  (pt did open mouth wide enough to observe)   Ice Chips Ice chips: Impaired Presentation: Spoon Oral Phase Impairments: Reduced lingual movement/coordination;Impaired anterior to posterior transit;Reduced labial seal;Poor awareness of bolus Oral Phase Functional Implications: Prolonged oral transit;Oral holding Pharyngeal Phase Impairments: Wet Vocal Quality;Suspected delayed Swallow (wet breathing quality) Other Comments: delayed swallow  when pt able to initiate swallow - post swallow congested breathing quality, likely due to aspiration of secretions, when pt instructed to move ice bolus around with her tongue Allison Washington moved her head (not her tongue) to try to aid melting    Thin Liquid Thin Liquid: Impaired Presentation: Spoon Oral Phase Impairments: Reduced labial seal;Reduced lingual movement/coordination;Impaired anterior to posterior transit Oral Phase Functional Implications: Right anterior spillage;Prolonged oral transit;Oral holding Pharyngeal  Phase Impairments: Suspected delayed Swallow;Wet Vocal Quality (wet breathing quality after swallow )    Nectar Thick Nectar Thick Liquid: Not tested   Honey Thick Honey Thick Liquid: Not tested   Puree Puree: Not tested Other Comments: pt declined to attempt to consume 1/2 tsp applesauce   Solid   GO    Solid: Not tested       Donavan Burnetamara Timberly Yott, MS [facility] SLP [phone]

## 2013-11-21 NOTE — Progress Notes (Addendum)
PULMONARY / CRITICAL CARE MEDICINE HISTORY AND PHYSICAL EXAMINATION   Name: Allison Washington  MRN: [id number] DOB: [birth year]    ADMISSION DATE:  11/20/2013  PRIMARY SERVICE: PCCM  CHIEF COMPLAINT:  Altered mental status  BRIEF PATIENT DESCRIPTION: [age] F with aphasia 2/2 multiple CVAs admitted 7/24 with AMS in setting of sepsis of unclear source complicated by possible iatrogenic narcotic overdose. [facility].   SIGNIFICANT EVENTS / STUDIES:  Cardiac arrest 7/25, ROSC obtained with bagging alone   LINES / TUBES: PIVs   CULTURES: Blood cultures x 2 7/24, NGTD Urine culture 7/24 NGTD Resp Viral Panel (ordered 7/25) Sputum Culture (7/25)  ANTIBIOTICS: Zosyn 7/24 - Vanc 7/24 -  HISTORY OF PRESENT ILLNESS:  Allison Washington is a 68 yo F with multiple CVAs, seizure DO, and aphasia who [facility] with encephalopathy and sepsis of unclear source. She was admitted to the stepdown unit and placed on a narcan drip. The narcan drip was discontinued this evening at 5:45 pm. At ~  6:45 p.m. the patient developed bradycardia which progressed to apparent loss of pulse. Because of a chart DNR in place CPR was not performed but ROSC was obtained with bagging alone.   Allison Washington is current intermittently able to provide history. She denies fevers, chills, SOB, CP, abdominal pain, headache or neck stiffness. Her family is unaware of any recent symptoms either.   PAST MEDICAL HISTORY :  Past Medical History  Diagnosis Date  . 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 Diabetes mellitus     Prone to hypoglycemia  . Aphagia     Status post numerous  . Dysphagia   . Peripheral vascular disease, unspecified   . Arterial disease   . Depressive disorder   . Hypothyroid   . Seizures   . MRSA  infection     History of MRSA in the left leg wound and sacrum in the past-has been seen by vascular surgery and declined any type of intervention including arteriogram  . Diverticulosis     GI bleed in 2007-s/p endoscopy and colonoscopy in 2007 s/p massive GIB consittent with Diveerticulosis  . DVT (deep venous thrombosis)     Not on Coumadin  . Anemia     Chronic  . Non-smoker   . Stroke   . Hypertension   . History of DVT (deep vein thrombosis) 03/06/2011  . Nursing home acquired MRSA infection 03/06/2011    History of MRSA in the left leg wound and sacrum in the past-has been seen by vascular surgery and declined any type of intervention including arteriogram   . SEIZURE DISORDER 12/17/2006    Qualifier: Diagnosis of  By: Clent RidgesFry MD, Tera MaterStephen A    Past Surgical History  Procedure Laterality Date  . Above knee leg amputation    . Femoral-popliteal bypass graft     Prior to Admission medications   Medication Sig Start Date End Date Taking? Authorizing Provider  Amino Acids-Protein Hydrolys (FEEDING SUPPLEMENT, PRO-STAT SUGAR FREE 64,) LIQD Take 30 mLs by mouth every morning.   Yes Historical Provider, MD  amLODipine (NORVASC) 10 MG tablet Take 10 mg by mouth every morning.   Yes Historical Provider, MD  aspirin 81 MG tablet Take 324 mg by mouth every morning.    Yes Historical  Provider, MD  cloNIDine (CATAPRES - DOSED IN MG/24 HR) 0.2 mg/24hr patch Place 1 patch onto the skin once a week.   Yes Historical Provider, MD  fentaNYL (DURAGESIC - DOSED MCG/HR) 50 MCG/HR Place 1 patch (50 mcg total) onto the skin every 3 (three) days. 10/27/13  Yes Kimber Relic, MD  fish oil-omega-3 fatty acids 1000 MG capsule Take 1 g by mouth 2 (two) times daily.    Yes Historical Provider, MD  furosemide (LASIX) 40 MG tablet Take 40 mg by mouth every morning.   Yes Historical Provider, MD  gemfibrozil (LOPID) 600 MG tablet Take 600 mg by mouth 2 (two) times daily before a meal.   Yes Historical Provider, MD   insulin aspart (NOVOLOG FLEXPEN) 100 UNIT/ML FlexPen Inject 7-10 Units into the skin daily with supper. 10 units with breakfast and lunch, 7 units with supper, hold for CBG < 80   Yes Historical Provider, MD  insulin glargine (LANTUS) 100 UNIT/ML injection Inject 66 Units into the skin daily at 12 noon.    Yes Historical Provider, MD  levothyroxine (SYNTHROID, LEVOTHROID) 88 MCG tablet Take 88 mcg by mouth daily. 4 pm   Yes Historical Provider, MD  linagliptin (TRADJENTA) 5 MG TABS tablet Take 5 mg by mouth every morning.    Yes Historical Provider, MD  LORazepam (ATIVAN) 0.5 MG tablet Take 0.5 mg by mouth every 8 (eight) hours as needed for anxiety.   Yes Historical Provider, MD  losartan (COZAAR) 25 MG tablet Take 25 mg by mouth 2 (two) times daily.   Yes Historical Provider, MD  metoprolol tartrate (LOPRESSOR) 25 MG tablet Take 25 mg by mouth 2 (two) times daily. 03/08/11  Yes Russella Dar, NP  mirabegron ER (MYRBETRIQ) 50 MG TB24 tablet Take 50 mg by mouth every morning.    Yes Historical Provider, MD  oxyCODONE (OXY IR/ROXICODONE) 5 MG immediate release tablet Take two tablets by mouth every four hours as needed for pain 11/16/13  Yes Tiffany L Reed, DO  polyethylene glycol (MIRALAX / GLYCOLAX) packet Take 17 g by mouth every morning. Mix in 8 ounces of water; for constipation   Yes Historical Provider, MD  pregabalin (LYRICA) 50 MG capsule Take 50 mg by mouth 3 (three) times daily.   Yes Historical Provider, MD  rosuvastatin (CRESTOR) 40 MG tablet Take 40 mg by mouth at bedtime.    Yes Historical Provider, MD  sennosides-docusate sodium (SENOKOT-S) 8.6-50 MG tablet Take 2 tablets by mouth every morning.   Yes Historical Provider, MD  zolpidem (AMBIEN) 5 MG tablet Take 2.5 mg by mouth at bedtime as needed for sleep.   Yes Historical Provider, MD  acetaminophen (TYLENOL) 325 MG tablet Take 650 mg by mouth every 6 (six) hours as needed for fever (pain).     Historical Provider, MD  nitroGLYCERIN  (NITROSTAT) 0.4 MG SL tablet Place 0.4 mg under the tongue every 5 (five) minutes x 3 doses as needed for chest pain. For chest pain    Historical Provider, MD   No Known Allergies  FAMILY HISTORY:  History reviewed. No pertinent family history. SOCIAL HISTORY:  reports that she has never smoked. She does not have any smokeless tobacco history on file. She reports that she does not drink alcohol or use illicit drugs.  REVIEW OF SYSTEMS:  A 12-system ROS was conducted and, unless otherwise specified in the HPI, was negative.    SUBJECTIVE:   VITAL SIGNS: Temp:  [97.2 F (36.2 C)-100.1  F (37.8 C)] 99.6 F (37.6 C) (07/25 1700) Pulse Rate:  [40-129] 116 (07/25 2030) Resp:  [4-26] 23 (07/25 2030) BP: (136-236)/(37-158) 193/73 mmHg (07/25 2030) SpO2:  [67 %-100 %] 94 % (07/25 2030) Weight:  [174 lb 13.2 oz (79.3 kg)] 174 lb 13.2 oz (79.3 kg) (07/25 0300) HEMODYNAMICS:   VENTILATOR SETTINGS:   INTAKE / OUTPUT: Intake/Output     07/25 0701 - 07/26 0700   I.V. (mL/kg) 46.8 (0.6)   IV Piggyback    Total Intake(mL/kg) 46.8 (0.6)   Net +46.8       Urine Occurrence    Stool Occurrence      PHYSICAL EXAMINATION: General:  [place] F in NAD Neuro:  On arrival A and answers questions with nods. After trial off narcan drip somnolent but arousable with sternal rub. Now back on narcan drip and answers questions with nods HEENT:  Sclera anicteric, bilateral cataracts, MMM, OP Clear Neck: Trachea supple and midline, (-) LAN or JVD, (-) Meningismus Cardiovascular:  Tachy, RR, NS1/S2, (-) MRG Lungs:  Coarse rhonchi in apices and mid-lungs bilaterally and in LLL, decreased BS in RLL Abdomen:  S/NT/ND/(+)BS Musculoskeletal:  (-) C/C/E; s/p R BKA Skin:  Intact  LABS:  CBC  Recent Labs Lab 11/20/13 1705 11/21/13 0257  WBC 16.7* 11.6*  HGB 12.5 11.5*  HCT 40.9 37.2  PLT 326 268   Coag's No results found for this basename: APTT, INR,  in the last 168 hours BMET  Recent  Labs Lab 11/20/13 1705 11/21/13 0257  NA 141 145  K 3.5* 4.0  CL 100 104  CO2 21 24  BUN 32* 36*  CREATININE 2.80* 2.69*  GLUCOSE 182* 110*   Electrolytes  Recent Labs Lab 11/20/13 1705 11/20/13 2238 11/21/13 0257  CALCIUM 9.3  --  8.8  MG  --  2.3  --    Sepsis Markers  Recent Labs Lab 11/20/13 1726 11/20/13 2238  LATICACIDVEN 2.31* 1.2   ABG  Recent Labs Lab 11/20/13 2304 11/21/13 0356 11/21/13 2005  PHART 7.291* 7.297* 7.375  PCO2ART 48.6* 52.0* 37.0  PO2ART 56.8* 69.7* 62.0*   Liver Enzymes  Recent Labs Lab 11/20/13 1705 11/21/13 0257  AST 68* 65*  ALT 27 26  ALKPHOS 131* 113  BILITOT <0.2* <0.2*  ALBUMIN 3.2* 2.9*   Cardiac Enzymes  Recent Labs Lab 11/20/13 1705 11/21/13 1900  TROPONINI  --  1.35*  PROBNP 832.1*  --    Glucose  Recent Labs Lab 11/21/13 0346 11/21/13 0925 11/21/13 1146 11/21/13 1412 11/21/13 1650 11/21/13 1834  GLUCAP 113* 141* 303* 180* 195* 197*    Imaging Ct Abdomen Pelvis Wo Contrast  11/21/2013   CLINICAL DATA:  Abdominal distention  EXAM: CT ABDOMEN AND PELVIS WITHOUT CONTRAST  TECHNIQUE: Multidetector CT imaging of the abdomen and pelvis was performed following the standard protocol without oral or IV contrast material administration.  COMPARISON:  February 08, 2011  FINDINGS: There is patchy infiltrate in the superior and posterior segments of the left lower lobe. There is atelectatic change in the right middle lobe. There is a minimal left effusion. There is a small hiatal hernia with some thickening of the distal esophageal wall. There are multiple foci of coronary artery calcification.  Liver is prominent, measuring 17.7 cm in length. No focal liver lesions are identified on this noncontrast enhanced study. Gallbladder is absent. The common bile duct is prominent measuring 12 mm in diameter, a stable finding compared to the prior study. There is no  intrahepatic biliary duct dilatation.  Spleen and pancreas  appear normal. Both adrenals show evidence of hypertrophy, a stable finding.  There is extensive arterial vascular calcification in both kidneys. There is a 1 x 1 cm cyst in the upper to mid left kidney, stable. No noncystic renal masses are identified on either side on this noncontrast enhanced study. There is no hydronephrosis or intrarenal calculus on either side. There is no ureteral calculus or ureterectasis on either side.  In the pelvis, the rectum is distended with stool. Urinary bladder is midline with normal wall thickness. There is marked thinning of the rectus muscle in the midline without frank hernia. There is no pelvic mass or fluid collection. The appendix is not convincingly seen; there is no periappendiceal region inflammation.  There is no bowel obstruction.  No free air or portal venous air.  There is no ascites, adenopathy, or abscess in the abdomen or pelvis. There is extensive atherosclerotic change in the aorta but no aneurysm. There is atherosclerotic change at the origins of the celiac and superior mesenteric arteries. There is no evidence of bowel wall pneumatosis or bowel wall thickening.  There is a stable mixed attenuation collection to the right of the sacrum posteriorly measuring 6.8 x 2.5 cm. Bones are osteoporotic. There is degenerative change in the lumbar spine. There are no blastic or lytic bone lesions.  IMPRESSION: There is no demonstrable bowel obstruction. No evidence suggesting bowel ischemia, although there is extensive atherosclerotic change in the aorta and proximal mesenteric arteries.  Persistent hiatal hernia with distal esophageal wall thickening, probably representing chronic reflux esophagitis.  Prominent liver without focal lesion. Stable extrahepatic biliary duct dilatation. The stability of this finding since 2012 is consistent with benign etiology and may at least in part be due to post cholecystectomy state.  Presumed partially calcified hematoma posterior to  the right sacrum, stable.  Thinning of the anterior rectus muscle without frank hernia.  Rectum distended with stool.  Chronic and stable adrenal hypertrophy bilaterally. Clinical significance of this finding is uncertain.   Electronically Signed   By: Bretta Bang M.D.   On: 11/21/2013 12:26   Dg Chest 2 View  11/21/2013   CLINICAL DATA:  Sepsis  EXAM: CHEST  2 VIEW  COMPARISON:  November 20, 2013.  FINDINGS: Interstitium remains prominent without frank edema. No consolidation seen. Heart size and pulmonary vascularity are normal. There is atherosclerotic change in the aorta. No pneumothorax. No appreciable bone lesions.  IMPRESSION: Interstitium remains mildly prominent, probably reflecting chronic inflammatory type change. No frank edema or consolidation. Atherosclerotic change again noted in the aorta.   Electronically Signed   By: Bretta Bang M.D.   On: 11/21/2013 12:39   Ct Head Wo Contrast  11/20/2013   CLINICAL DATA:  Loss of consciousness, history of stroke  EXAM: CT HEAD WITHOUT CONTRAST  TECHNIQUE: Contiguous axial images were obtained from the base of the skull through the vertex without intravenous contrast.  COMPARISON:  07/13/2007  FINDINGS: Mild to moderate diffuse atrophy. Moderate low attenuation in the deep white matter. Evidence of stable chronic lacunar infarct in the region of the bilateral basal ganglia. Tiny lacunar infarct deep periventricular white matter image number 18, also stable. Stable lacunar infarct in the pons. No evidence of vascular territory infarct. No evidence of hemorrhage or extra-axial fluid. No hydrocephalus. Calvarium is intact.  IMPRESSION: Chronic involutional change and chronic lacunar infarcts, stable from prior study. No acute findings.   Electronically Signed   By:  Esperanza Heir M.D.   On: 11/20/2013 20:02   Mr Brain Wo Contrast  11/21/2013   CLINICAL DATA:  Acute encephalopathy.  Rule out seizures.  EXAM: MRI HEAD WITHOUT CONTRAST  TECHNIQUE:  Multiplanar, multiecho pulse sequences of the brain and surrounding structures were obtained without intravenous contrast.  COMPARISON:  Head CT 11/20/2013 and MRI 10/07/2005  FINDINGS: There is no evidence of acute infarct, intracranial hemorrhage, mass, midline shift, or extra-axial fluid collection. Hippocampi are symmetric in size and signal. Foci of T2 hyperintensity in the periventricular white matter are compatible with moderate chronic small vessel ischemic disease, similar to the prior study. There is moderate cerebral atrophy. Old, small infarcts are again seen in the brainstem, basal ganglia, thalamus, and deep cerebral white matter.  Orbits are unremarkable. Focal left posterior ethmoid air cell opacification is noted. Mastoid air cells are clear. Major intracranial vascular flow voids are preserved.  IMPRESSION: 1. No evidence of acute intracranial abnormality. 2. Advanced chronic small vessel ischemic disease and remote lacunar infarcts.   Electronically Signed   By: Sebastian Ache   On: 11/21/2013 14:09   Dg Chest Port 1 View  11/21/2013   CLINICAL DATA:  Fluid overload  EXAM: PORTABLE CHEST - 1 VIEW  COMPARISON:  November 21, 2013  FINDINGS: There is patchy infiltrate in the left base. Elsewhere lungs are clear. Heart size and pulmonary vascularity are normal. No adenopathy. There is atherosclerotic change in the aorta.  IMPRESSION: Patchy infiltrate left base.   Electronically Signed   By: Bretta Bang M.D.   On: 11/21/2013 20:06   Dg Chest Port 1 View  11/20/2013   CLINICAL DATA:  Altered mental status.  EXAM: PORTABLE CHEST - 1 VIEW  COMPARISON:  02/09/2012.  FINDINGS: The heart is upper limits of normal in size and stable. There is tortuosity and calcification of the thoracic aorta. Diffuse increased interstitial markings suspicious for interstitial edema. No definite pleural effusions or focal infiltrates.  IMPRESSION: Chronic lung changes with possible superimposed interstitial edema. No  infiltrates or effusions.   Electronically Signed   By: Loralie Champagne M.D.   On: 11/20/2013 17:42    EKG: Personally reviewed. There are ST depressions in V3-V5 with some non-specific ST changes. ? 2/2 hypertrophy.   CXR: Personally reviewed. There is moderate bilateral patchy interstitial infiltrate with a new LLL opacity.   ASSESSMENT / PLAN:  Principal Problem:   Encephalopathy Active Problems:   Sepsis   Acute respiratory failure with hypoxia   Cardiac arrest   Diabetes mellitus with neurological manifestations, uncontrolled   Leukocytosis, unspecified   Acute on chronic kidney failure   Troponin level elevated   Acute on chronic diastolic heart failure   HYPERTENSION   SEIZURE DISORDER   Unspecified hypothyroidism   NASH (nonalcoholic steatohepatitis)   Anemia of chronic disease   PULMONARY A: Acute hypoxic respiratory failure: This is likely 2/2 a combination of both aspiration and acute heart failure. Her chest X-ray on presentation was consistent more with acute heart failure. The new LLL opacity is likely aspiration which likely happened during her arrest. She is currently adequately protecting her airway. P:   Supplemental O2 to maintain sats >= 94%  CARDIOVASCULAR A: NSTEMI: The elevated troponin following arrest may have been 2/2 to the arrest itself or may have been present prior to admission/at the time of admission. Cardiac Arrest: Unclear etiology. The code not does not specify any rhythems other than sinus bradycardia. This may have been a respiratory-related  arrest (patient was blue when brady). Acute on Chronic Diastolic HF: HTN: Currently hypertensive. ? If component of hypertensive urgency. Sepsis: No clear source on admission. ? If just SIRS 2/2 NSTEMI.  P:   Asa stat Heparin GTT Serial Trops Cardiology Consult Repeat Code Labs  Echocardiogram Diuresis  Strict I/O PRN Labetolol, cont clonidine Add norvasc  Serial Lactates  RENAL A: Acute  on Chronic Renal Disease: ? If 2/2 sepsis vs fluid overload P:   Monitor Cr with Diuresis  GASTROINTESTINAL A: NASH P:     HEMATOLOGIC A: Leukocytosis AOCD P:   Serial CBCs  INFECTIOUS A: RO pneumonia:  P:   Cont Empiric Vanc and Zosyn Culture sputum  ENDOCRINE A: DM:  Hypothroidism P:   SSI Recheck TSH  NEUROLOGIC A: Encephalopathy: Unclear etiology. Possibly some contribution of narcotics but underlying sepsis/SIRS likely playing at least some role. I tried taking patient off of narcan drip and she became more somnolent. No evidence of repeat stroke on MRI 7/25. Seizure DO: s/p EEG 7/24 without evidence of acute seizure activity.  P:   Cont narcan drip for now No acute need for intubation but may require Consider neuro consult if no improvement in near future  BEST PRACTICE / DISPOSITION Level of Care:  ICU Primary Service:  PCCM Consultants:  Cards Code Status:  Full, confirmed with family evening of 7/25 Diet:  NPO for now DVT Px:  On heparin GTT GI Px:  None Skin Integrity:  Intact Social / Family:  Updated at besdie  TODAY'S SUMMARY:   I have personally obtained a history, examined the patient, evaluated laboratory and imaging results, formulated the assessment and plan and placed orders.  CRITICAL CARE: The patient is critically ill with multiple organ systems failure and requires high complexity decision making for assessment and support, frequent evaluation and titration of therapies, application of advanced monitoring technologies and extensive interpretation of multiple databases. Critical Care Time devoted to patient care services described in this note is 70 minutes.   Evalyn Casco, MD Pulmonary and Critical Care Medicine [facility] Pager: [phone]   11/21/2013, 8:43 PM

## 2013-11-21 NOTE — Progress Notes (Signed)
Chaplain responded to code blue page.  Pt was stabilized by medical team and there was not family present at the time.  Chaplain will be around should spiritual support be needed at a later time.

## 2013-11-21 NOTE — Consult Note (Signed)
Cardiology Consult Note GREEN, Viviann Spare, MD  Reason for consult: s/p code blue, abnormal ekg and positive troponins  History of Present Illness (and review of medical records): Allison Washington is a 68 y.o. female who presents for evaluation of altered mental status admitted to the Hospitalist service.  She is a resident at local SNF and has multiple medical problems including HTN, multiple CVAs, chronic pain, IDDM, seizure d/o presenting with encephalopathy and presumed sepsis. On day of presentation patient was unresponsive despite vigorous arousal.  She was hypoglycemic.  She had multiple episodes of vomiting en route to ED.  She was in respiratory distress, sating in 90s on NRB.  Initial labs revealed WBC 16.7, Cr 2.8, Lactate 2.3, Trop 0.02, BNP 832.  Initial concern that AMS may have been secondary to narcotics.  She was started on narcan gtt alogn with empiric tx for Sepsis.  Initial CT head and MRI were both negative for acute infarcts.  Around 6pm 7/25, patient had a code blue event.  Patient was unresponsive, foaming at the mouth with agonal respirations.  She was bradycardic but maintained pulses.  Patient was stabilized with suctioning and supplemental O2 via ambu bag.  Cardiology was consulted due to ST depressions on EKG and elevated troponins s/p event.  Echocardiogram 01/2012 Study Conclusions Left ventricle: The cavity size was normal. There was moderate focal basal hypertrophy of the septum. Systolic function was normal. The estimated ejection fraction was in the range of 55% to 65%. Although no diagnostic regional wall motion abnormality was identified, this possibility cannot be completely excluded on the basis of this study. Doppler parameters are consistent with abnormal left ventricular relaxation (grade 1 diastolic dysfunction). Doppler parameters are consistent with high ventricular filling pressure.  Review of Systems Review of systems not obtained due to patient  factors.  Patient Active Problem List   Diagnosis Date Noted  . Leukocytosis, unspecified 11/21/2013  . Acute on chronic kidney failure 11/21/2013  . NASH (nonalcoholic steatohepatitis) 11/21/2013  . Troponin level elevated 11/21/2013  . Anemia of chronic disease 11/21/2013  . Acute respiratory failure with hypoxia 11/21/2013  . Cardiac arrest 11/21/2013  . Acute on chronic diastolic heart failure [id number]/8768  . Sepsis 11/20/2013  . Encephalopathy 11/20/2013  . Unspecified hypothyroidism 04/07/2013  . Neuropathy 02/02/2013  . Other and unspecified hyperlipidemia 10/20/2012  . CHF (congestive heart failure) 02/09/2012  . CVA (cerebral vascular accident) 03/06/2011  . S/P AKA (above knee amputation) unilateral 03/06/2011  . Diabetes mellitus with neurological manifestations, uncontrolled 12/17/2006  . HYPERTENSION 12/17/2006  . SEIZURE DISORDER 12/17/2006  . URINARY INCONTINENCE 12/17/2006   Past Medical History  Diagnosis Date  . 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 Diabetes mellitus     Prone to hypoglycemia  . Aphagia     Status post numerous  . Dysphagia   . Peripheral vascular disease, unspecified   . Arterial disease   . Depressive disorder   . Hypothyroid   . Seizures   . MRSA infection     History of MRSA in the left leg wound and sacrum in the past-has been seen by vascular surgery and declined any type of intervention including arteriogram  . Diverticulosis     GI bleed in 2007-s/p endoscopy and colonoscopy in 2007 s/p massive GIB consittent with Diveerticulosis  . DVT (deep venous thrombosis)  Not on Coumadin  . Anemia     Chronic  . Non-smoker   . Stroke   . Hypertension   . History of DVT (deep vein thrombosis) 03/06/2011  . Nursing home acquired MRSA infection 03/06/2011    History of MRSA in the left leg wound and  sacrum in the past-has been seen by vascular surgery and declined any type of intervention including arteriogram   . SEIZURE DISORDER 12/17/2006    Qualifier: Diagnosis of  By: Sarajane Jews MD, Ishmael Holter     Past Surgical History  Procedure Laterality Date  . Above knee leg amputation    . Femoral-popliteal bypass graft      Prescriptions prior to admission  Medication Sig Dispense Refill  . Amino Acids-Protein Hydrolys (FEEDING SUPPLEMENT, PRO-STAT SUGAR FREE 64,) LIQD Take 30 mLs by mouth every morning.      Marland Kitchen amLODipine (NORVASC) 10 MG tablet Take 10 mg by mouth every morning.      Marland Kitchen aspirin 81 MG tablet Take 324 mg by mouth every morning.       . cloNIDine (CATAPRES - DOSED IN MG/24 HR) 0.2 mg/24hr patch Place 1 patch onto the skin once a week.      . fentaNYL (DURAGESIC - DOSED MCG/HR) 50 MCG/HR Place 1 patch (50 mcg total) onto the skin every 3 (three) days.  10 patch  0  . fish oil-omega-3 fatty acids 1000 MG capsule Take 1 g by mouth 2 (two) times daily.       . furosemide (LASIX) 40 MG tablet Take 40 mg by mouth every morning.      Marland Kitchen gemfibrozil (LOPID) 600 MG tablet Take 600 mg by mouth 2 (two) times daily before a meal.      . insulin aspart (NOVOLOG FLEXPEN) 100 UNIT/ML FlexPen Inject 7-10 Units into the skin daily with supper. 10 units with breakfast and lunch, 7 units with supper, hold for CBG < 80      . insulin glargine (LANTUS) 100 UNIT/ML injection Inject 66 Units into the skin daily at 12 noon.       Marland Kitchen levothyroxine (SYNTHROID, LEVOTHROID) 88 MCG tablet Take 88 mcg by mouth daily. 4 pm      . linagliptin (TRADJENTA) 5 MG TABS tablet Take 5 mg by mouth every morning.       Marland Kitchen LORazepam (ATIVAN) 0.5 MG tablet Take 0.5 mg by mouth every 8 (eight) hours as needed for anxiety.      Marland Kitchen losartan (COZAAR) 25 MG tablet Take 25 mg by mouth 2 (two) times daily.      . metoprolol tartrate (LOPRESSOR) 25 MG tablet Take 25 mg by mouth 2 (two) times daily.      . mirabegron ER (MYRBETRIQ) 50 MG  TB24 tablet Take 50 mg by mouth every morning.       Marland Kitchen oxyCODONE (OXY IR/ROXICODONE) 5 MG immediate release tablet Take two tablets by mouth every four hours as needed for pain  360 tablet  0  . polyethylene glycol (MIRALAX / GLYCOLAX) packet Take 17 g by mouth every morning. Mix in 8 ounces of water; for constipation      . pregabalin (LYRICA) 50 MG capsule Take 50 mg by mouth 3 (three) times daily.      . rosuvastatin (CRESTOR) 40 MG tablet Take 40 mg by mouth at bedtime.       . sennosides-docusate sodium (SENOKOT-S) 8.6-50 MG tablet Take 2 tablets by mouth every morning.      Marland Kitchen  zolpidem (AMBIEN) 5 MG tablet Take 2.5 mg by mouth at bedtime as needed for sleep.      Marland Kitchen acetaminophen (TYLENOL) 325 MG tablet Take 650 mg by mouth every 6 (six) hours as needed for fever (pain).       . nitroGLYCERIN (NITROSTAT) 0.4 MG SL tablet Place 0.4 mg under the tongue every 5 (five) minutes x 3 doses as needed for chest pain. For chest pain       No Known Allergies  History  Substance Use Topics  . Smoking status: Never Smoker   . Smokeless tobacco: Not on file  . Alcohol Use: No    History reviewed. No pertinent family history.   Objective:  Patient Vitals for the past 8 hrs:  BP Temp Temp src Pulse Resp SpO2  11/21/13 2030 193/73 mmHg - - 116 23 94 %  11/21/13 1945 208/76 mmHg - - 119 - 90 %  11/21/13 1900 220/75 mmHg - - 114 19 100 %  11/21/13 1845 182/51 mmHg - - 116 24 95 %  11/21/13 1829 178/98 mmHg - - 125 23 98 %  11/21/13 1821 - - - 129 26 67 %  11/21/13 1815 - - - 40 4 70 %  11/21/13 1800 199/72 mmHg - - 116 23 93 %  11/21/13 1700 198/85 mmHg 99.6 F (37.6 C) Axillary 115 22 94 %  11/21/13 1611 198/85 mmHg - - - - -  11/21/13 1500 207/75 mmHg - - 109 18 93 %  11/21/13 1417 161/116 mmHg - - 121 22 92 %   General appearance: awake, alert, no apparent distress Head: facial droop with tongue deviated to right Eyes:  PERRL, EOM's intact.  Neck: supple Lungs: coarse breath  sounds Heart: regular rate and rhythm, S1, S2 normal,  Abdomen: soft, bowel sounds normal; no masses,  no organomegaly Extremities: extremities normal, atraumatic, no cyanosis or edema Pulses: 2+ and symmetric Neurologic: does not answer questions, does not follow commands, does track and can nod yes/no  Results for orders placed during the hospital encounter of 11/20/13 (from the past 48 hour(s))  CBC WITH DIFFERENTIAL     Status: Abnormal   Collection Time    11/20/13  5:05 PM      Result Value Ref Range   WBC 16.7 (*) 4.0 - 10.5 K/uL   RBC 4.51  3.87 - 5.11 MIL/uL   Hemoglobin 12.5  12.0 - 15.0 g/dL   HCT 40.9  36.0 - 46.0 %   MCV 90.7  78.0 - 100.0 fL   MCH 27.7  26.0 - 34.0 pg   MCHC 30.6  30.0 - 36.0 g/dL   RDW 14.8  11.5 - 15.5 %   Platelets 326  150 - 400 K/uL   Neutrophils Relative % 69  43 - 77 %   Neutro Abs 11.6 (*) 1.7 - 7.7 K/uL   Lymphocytes Relative 24  12 - 46 %   Lymphs Abs 4.0  0.7 - 4.0 K/uL   Monocytes Relative 7  3 - 12 %   Monocytes Absolute 1.1 (*) 0.1 - 1.0 K/uL   Eosinophils Relative 0  0 - 5 %   Eosinophils Absolute 0.1  0.0 - 0.7 K/uL   Basophils Relative 0  0 - 1 %   Basophils Absolute 0.0  0.0 - 0.1 K/uL  COMPREHENSIVE METABOLIC PANEL     Status: Abnormal   Collection Time    11/20/13  5:05 PM      Result  Value Ref Range   Sodium 141  137 - 147 mEq/L   Potassium 3.5 (*) 3.7 - 5.3 mEq/L   Chloride 100  96 - 112 mEq/L   CO2 21  19 - 32 mEq/L   Glucose, Bld 182 (*) 70 - 99 mg/dL   BUN 32 (*) 6 - 23 mg/dL   Creatinine, Ser 2.80 (*) 0.50 - 1.10 mg/dL   Calcium 9.3  8.4 - 10.5 mg/dL   Total Protein 8.3  6.0 - 8.3 g/dL   Albumin 3.2 (*) 3.5 - 5.2 g/dL   AST 68 (*) 0 - 37 U/L   ALT 27  0 - 35 U/L   Alkaline Phosphatase 131 (*) 39 - 117 U/L   Total Bilirubin <0.2 (*) 0.3 - 1.2 mg/dL   GFR calc non Af Amer 17 (*) >90 mL/min   GFR calc Af Amer 19 (*) >90 mL/min   Comment: (NOTE)     The eGFR has been calculated using the CKD EPI equation.      This calculation has not been validated in all clinical situations.     eGFR's persistently <90 mL/min signify possible Chronic Kidney     Disease.   Anion gap 20 (*) 5 - 15  PRO B NATRIURETIC PEPTIDE     Status: Abnormal   Collection Time    11/20/13  5:05 PM      Result Value Ref Range   Pro B Natriuretic peptide (BNP) 832.1 (*) 0 - 125 pg/mL  CBG MONITORING, ED     Status: Abnormal   Collection Time    11/20/13  5:08 PM      Result Value Ref Range   Glucose-Capillary 143 (*) 70 - 99 mg/dL  CBG MONITORING, ED     Status: Abnormal   Collection Time    11/20/13  5:19 PM      Result Value Ref Range   Glucose-Capillary 149 (*) 70 - 99 mg/dL  I-STAT TROPOININ, ED     Status: None   Collection Time    11/20/13  5:23 PM      Result Value Ref Range   Troponin i, poc 0.02  0.00 - 0.08 ng/mL   Comment 3            Comment: Due to the release kinetics of cTnI,     a negative result within the first hours     of the onset of symptoms does not rule out     myocardial infarction with certainty.     If myocardial infarction is still suspected,     repeat the test at appropriate intervals.  I-STAT CG4 LACTIC ACID, ED     Status: Abnormal   Collection Time    11/20/13  5:26 PM      Result Value Ref Range   Lactic Acid, Venous 2.31 (*) 0.5 - 2.2 mmol/L  URINALYSIS, ROUTINE W REFLEX MICROSCOPIC     Status: Abnormal   Collection Time    11/20/13  8:18 PM      Result Value Ref Range   Color, Urine YELLOW  YELLOW   APPearance CLOUDY (*) CLEAR   Specific Gravity, Urine 1.017  1.005 - 1.030   pH 6.0  5.0 - 8.0   Glucose, UA 100 (*) NEGATIVE mg/dL   Hgb urine dipstick LARGE (*) NEGATIVE   Bilirubin Urine NEGATIVE  NEGATIVE   Ketones, ur NEGATIVE  NEGATIVE mg/dL   Protein, ur >300 (*) NEGATIVE mg/dL   Urobilinogen,  UA 0.2  0.0 - 1.0 mg/dL   Nitrite NEGATIVE  NEGATIVE   Leukocytes, UA NEGATIVE  NEGATIVE  URINE MICROSCOPIC-ADD ON     Status: None   Collection Time    11/20/13  8:18 PM       Result Value Ref Range   WBC, UA 0-2  <3 WBC/hpf   RBC / HPF 0-2  <3 RBC/hpf  CBG MONITORING, ED     Status: Abnormal   Collection Time    11/20/13  8:59 PM      Result Value Ref Range   Glucose-Capillary 103 (*) 70 - 99 mg/dL   Comment 1 Notify RN     Comment 2 Documented in Chart    MRSA PCR SCREENING     Status: None   Collection Time    11/20/13  9:52 PM      Result Value Ref Range   MRSA by PCR NEGATIVE  NEGATIVE   Comment:            The GeneXpert MRSA Assay (FDA     approved for NASAL specimens     only), is one component of a     comprehensive MRSA colonization     surveillance program. It is not     intended to diagnose MRSA     infection nor to guide or     monitor treatment for     MRSA infections.  GLUCOSE, CAPILLARY     Status: None   Collection Time    11/20/13  9:53 PM      Result Value Ref Range   Glucose-Capillary 80  70 - 99 mg/dL  HEMOGLOBIN A1C     Status: Abnormal   Collection Time    11/20/13 10:38 PM      Result Value Ref Range   Hemoglobin A1C 9.5 (*) <5.7 %   Comment: (NOTE)                                                                               According to the ADA Clinical Practice Recommendations for 2011, when     HbA1c is used as a screening test:      >=6.5%   Diagnostic of Diabetes Mellitus               (if abnormal result is confirmed)     5.7-6.4%   Increased risk of developing Diabetes Mellitus     References:Diagnosis and Classification of Diabetes Mellitus,Diabetes     TOIZ,1245,80(DXIPJ 1):S62-S69 and Standards of Medical Care in             Diabetes - 2011,Diabetes Care,2011,34 (Suppl 1):S11-S61.   Mean Plasma Glucose 226 (*) <117 mg/dL   Comment: Performed at [facility] ACID, PLASMA     Status: None   Collection Time    11/20/13 10:38 PM      Result Value Ref Range   Lactic Acid, Venous 1.2  0.5 - 2.2 mmol/L  MAGNESIUM     Status: None   Collection Time    11/20/13 10:38 PM      Result Value Ref  Range   Magnesium 2.3  1.5 - 2.5 mg/dL  BLOOD GAS, ARTERIAL     Status: Abnormal   Collection Time    11/20/13 11:04 PM      Result Value Ref Range   O2 Content 2.0     Delivery systems NASAL CANNULA     pH, Arterial 7.291 (*) 7.350 - 7.450   pCO2 arterial 48.6 (*) 35.0 - 45.0 mmHg   pO2, Arterial 56.8 (*) 80.0 - 100.0 mmHg   Bicarbonate 22.7  20.0 - 24.0 mEq/L   TCO2 24.2  0 - 100 mmol/L   Acid-base deficit 2.9 (*) 0.0 - 2.0 mmol/L   O2 Saturation 87.4     Patient temperature 98.6     Collection site LEFT RADIAL     Drawn by [id number]     Sample type ARTERIAL DRAW     Allens test (pass/fail) PASS  PASS  GLUCOSE, CAPILLARY     Status: None   Collection Time    11/20/13 11:17 PM      Result Value Ref Range   Glucose-Capillary 84  70 - 99 mg/dL  COMPREHENSIVE METABOLIC PANEL     Status: Abnormal   Collection Time    11/21/13  2:57 AM      Result Value Ref Range   Sodium 145  137 - 147 mEq/L   Potassium 4.0  3.7 - 5.3 mEq/L   Chloride 104  96 - 112 mEq/L   CO2 24  19 - 32 mEq/L   Glucose, Bld 110 (*) 70 - 99 mg/dL   BUN 36 (*) 6 - 23 mg/dL   Creatinine, Ser 2.69 (*) 0.50 - 1.10 mg/dL   Calcium 8.8  8.4 - 10.5 mg/dL   Total Protein 7.6  6.0 - 8.3 g/dL   Albumin 2.9 (*) 3.5 - 5.2 g/dL   AST 65 (*) 0 - 37 U/L   ALT 26  0 - 35 U/L   Alkaline Phosphatase 113  39 - 117 U/L   Total Bilirubin <0.2 (*) 0.3 - 1.2 mg/dL   GFR calc non Af Amer 17 (*) >90 mL/min   GFR calc Af Amer 20 (*) >90 mL/min   Comment: (NOTE)     The eGFR has been calculated using the CKD EPI equation.     This calculation has not been validated in all clinical situations.     eGFR's persistently <90 mL/min signify possible Chronic Kidney     Disease.   Anion gap 17 (*) 5 - 15  CBC WITH DIFFERENTIAL     Status: Abnormal   Collection Time    11/21/13  2:57 AM      Result Value Ref Range   WBC 11.6 (*) 4.0 - 10.5 K/uL   RBC 4.19  3.87 - 5.11 MIL/uL   Hemoglobin 11.5 (*) 12.0 - 15.0 g/dL   HCT 37.2  36.0 -  46.0 %   MCV 88.8  78.0 - 100.0 fL   MCH 27.4  26.0 - 34.0 pg   MCHC 30.9  30.0 - 36.0 g/dL   RDW 14.7  11.5 - 15.5 %   Platelets 268  150 - 400 K/uL   Neutrophils Relative % 79 (*) 43 - 77 %   Neutro Abs 9.1 (*) 1.7 - 7.7 K/uL   Lymphocytes Relative 15  12 - 46 %   Lymphs Abs 1.8  0.7 - 4.0 K/uL   Monocytes Relative 6  3 - 12 %   Monocytes Absolute 0.7  0.1 - 1.0 K/uL   Eosinophils Relative 0  0 - 5 %   Eosinophils Absolute 0.0  0.0 - 0.7 K/uL   Basophils Relative 0  0 - 1 %   Basophils Absolute 0.0  0.0 - 0.1 K/uL  GLUCOSE, CAPILLARY     Status: Abnormal   Collection Time    11/21/13  3:46 AM      Result Value Ref Range   Glucose-Capillary 113 (*) 70 - 99 mg/dL  BLOOD GAS, ARTERIAL     Status: Abnormal   Collection Time    11/21/13  3:56 AM      Result Value Ref Range   O2 Content 2.0     Delivery systems NASAL CANNULA     pH, Arterial 7.297 (*) 7.350 - 7.450   pCO2 arterial 52.0 (*) 35.0 - 45.0 mmHg   pO2, Arterial 69.7 (*) 80.0 - 100.0 mmHg   Bicarbonate 24.4 (*) 20.0 - 24.0 mEq/L   TCO2 25.9  0 - 100 mmol/L   Acid-base deficit 1.1  0.0 - 2.0 mmol/L   O2 Saturation 91.0     Patient temperature 100.1     Collection site LEFT RADIAL     Drawn by [id number]     Sample type ARTERIAL DRAW     Allens test (pass/fail) PASS  PASS  GLUCOSE, CAPILLARY     Status: Abnormal   Collection Time    11/21/13  9:25 AM      Result Value Ref Range   Glucose-Capillary 141 (*) 70 - 99 mg/dL  AMMONIA     Status: None   Collection Time    11/21/13 10:00 AM      Result Value Ref Range   Ammonia 40  11 - 60 umol/L  GLUCOSE, CAPILLARY     Status: Abnormal   Collection Time    11/21/13 11:46 AM      Result Value Ref Range   Glucose-Capillary 303 (*) 70 - 99 mg/dL  GLUCOSE, CAPILLARY     Status: Abnormal   Collection Time    11/21/13  2:12 PM      Result Value Ref Range   Glucose-Capillary 180 (*) 70 - 99 mg/dL  GLUCOSE, CAPILLARY     Status: Abnormal   Collection Time    11/21/13   4:50 PM      Result Value Ref Range   Glucose-Capillary 195 (*) 70 - 99 mg/dL   Comment 1 Documented in Chart     Comment 2 Notify RN    GLUCOSE, CAPILLARY     Status: Abnormal   Collection Time    11/21/13  6:34 PM      Result Value Ref Range   Glucose-Capillary 197 (*) 70 - 99 mg/dL  TROPONIN I     Status: Abnormal   Collection Time    11/21/13  7:00 PM      Result Value Ref Range   Troponin I 1.35 (*) <0.30 ng/mL   Comment:            Due to the release kinetics of cTnI,     a negative result within the first hours     of the onset of symptoms does not rule out     myocardial infarction with certainty.     If myocardial infarction is still suspected,     repeat the test at appropriate intervals.     CRITICAL RESULT CALLED TO, READ BACK BY AND VERIFIED WITH:     R.ZARSONA,RN 1955 11/21/13 M.CAMPBELL  POCT I-STAT 3, ART BLOOD  GAS (G3+)     Status: Abnormal   Collection Time    11/21/13  8:05 PM      Result Value Ref Range   pH, Arterial 7.375  7.350 - 7.450   pCO2 arterial 37.0  35.0 - 45.0 mmHg   pO2, Arterial 62.0 (*) 80.0 - 100.0 mmHg   Bicarbonate 21.6  20.0 - 24.0 mEq/L   TCO2 23  0 - 100 mmol/L   O2 Saturation 90.0     Acid-base deficit 3.0 (*) 0.0 - 2.0 mmol/L   Patient temperature 99.6 F     Collection site RADIAL, ALLEN'S TEST ACCEPTABLE     Drawn by RT     Sample type ARTERIAL    CBC WITH DIFFERENTIAL     Status: Abnormal   Collection Time    11/21/13  8:55 PM      Result Value Ref Range   WBC 14.1 (*) 4.0 - 10.5 K/uL   RBC 4.44  3.87 - 5.11 MIL/uL   Hemoglobin 12.1  12.0 - 15.0 g/dL   HCT 39.8  36.0 - 46.0 %   MCV 89.6  78.0 - 100.0 fL   MCH 27.3  26.0 - 34.0 pg   MCHC 30.4  30.0 - 36.0 g/dL   RDW 15.0  11.5 - 15.5 %   Platelets 331  150 - 400 K/uL   Neutrophils Relative % 64  43 - 77 %   Neutro Abs 9.0 (*) 1.7 - 7.7 K/uL   Lymphocytes Relative 30  12 - 46 %   Lymphs Abs 4.3 (*) 0.7 - 4.0 K/uL   Monocytes Relative 6  3 - 12 %   Monocytes Absolute  0.8  0.1 - 1.0 K/uL   Eosinophils Relative 0  0 - 5 %   Eosinophils Absolute 0.0  0.0 - 0.7 K/uL   Basophils Relative 0  0 - 1 %   Basophils Absolute 0.0  0.0 - 0.1 K/uL   Ct Abdomen Pelvis Wo Contrast  11/21/2013   CLINICAL DATA:  Abdominal distention  EXAM: CT ABDOMEN AND PELVIS WITHOUT CONTRAST  TECHNIQUE: Multidetector CT imaging of the abdomen and pelvis was performed following the standard protocol without oral or IV contrast material administration.  COMPARISON:  February 08, 2011  FINDINGS: There is patchy infiltrate in the superior and posterior segments of the left lower lobe. There is atelectatic change in the right middle lobe. There is a minimal left effusion. There is a small hiatal hernia with some thickening of the distal esophageal wall. There are multiple foci of coronary artery calcification.  Liver is prominent, measuring 17.7 cm in length. No focal liver lesions are identified on this noncontrast enhanced study. Gallbladder is absent. The common bile duct is prominent measuring 12 mm in diameter, a stable finding compared to the prior study. There is no intrahepatic biliary duct dilatation.  Spleen and pancreas appear normal. Both adrenals show evidence of hypertrophy, a stable finding.  There is extensive arterial vascular calcification in both kidneys. There is a 1 x 1 cm cyst in the upper to mid left kidney, stable. No noncystic renal masses are identified on either side on this noncontrast enhanced study. There is no hydronephrosis or intrarenal calculus on either side. There is no ureteral calculus or ureterectasis on either side.  In the pelvis, the rectum is distended with stool. Urinary bladder is midline with normal wall thickness. There is marked thinning of the rectus muscle in the midline without frank hernia.  There is no pelvic mass or fluid collection. The appendix is not convincingly seen; there is no periappendiceal region inflammation.  There is no bowel obstruction.  No  free air or portal venous air.  There is no ascites, adenopathy, or abscess in the abdomen or pelvis. There is extensive atherosclerotic change in the aorta but no aneurysm. There is atherosclerotic change at the origins of the celiac and superior mesenteric arteries. There is no evidence of bowel wall pneumatosis or bowel wall thickening.  There is a stable mixed attenuation collection to the right of the sacrum posteriorly measuring 6.8 x 2.5 cm. Bones are osteoporotic. There is degenerative change in the lumbar spine. There are no blastic or lytic bone lesions.  IMPRESSION: There is no demonstrable bowel obstruction. No evidence suggesting bowel ischemia, although there is extensive atherosclerotic change in the aorta and proximal mesenteric arteries.  Persistent hiatal hernia with distal esophageal wall thickening, probably representing chronic reflux esophagitis.  Prominent liver without focal lesion. Stable extrahepatic biliary duct dilatation. The stability of this finding since 2012 is consistent with benign etiology and may at least in part be due to post cholecystectomy state.  Presumed partially calcified hematoma posterior to the right sacrum, stable.  Thinning of the anterior rectus muscle without frank hernia.  Rectum distended with stool.  Chronic and stable adrenal hypertrophy bilaterally. Clinical significance of this finding is uncertain.   Electronically Signed   By: Lowella Grip M.D.   On: 11/21/2013 12:26   Dg Chest 2 View  11/21/2013   CLINICAL DATA:  Sepsis  EXAM: CHEST  2 VIEW  COMPARISON:  November 20, 2013.  FINDINGS: Interstitium remains prominent without frank edema. No consolidation seen. Heart size and pulmonary vascularity are normal. There is atherosclerotic change in the aorta. No pneumothorax. No appreciable bone lesions.  IMPRESSION: Interstitium remains mildly prominent, probably reflecting chronic inflammatory type change. No frank edema or consolidation. Atherosclerotic  change again noted in the aorta.   Electronically Signed   By: Lowella Grip M.D.   On: 11/21/2013 12:39   Ct Head Wo Contrast  11/20/2013   CLINICAL DATA:  Loss of consciousness, history of stroke  EXAM: CT HEAD WITHOUT CONTRAST  TECHNIQUE: Contiguous axial images were obtained from the base of the skull through the vertex without intravenous contrast.  COMPARISON:  07/13/2007  FINDINGS: Mild to moderate diffuse atrophy. Moderate low attenuation in the deep white matter. Evidence of stable chronic lacunar infarct in the region of the bilateral basal ganglia. Tiny lacunar infarct deep periventricular white matter image number 18, also stable. Stable lacunar infarct in the pons. No evidence of vascular territory infarct. No evidence of hemorrhage or extra-axial fluid. No hydrocephalus. Calvarium is intact.  IMPRESSION: Chronic involutional change and chronic lacunar infarcts, stable from prior study. No acute findings.   Electronically Signed   By: Skipper Cliche M.D.   On: 11/20/2013 20:02   Mr Brain Wo Contrast  11/21/2013   CLINICAL DATA:  Acute encephalopathy.  Rule out seizures.  EXAM: MRI HEAD WITHOUT CONTRAST  TECHNIQUE: Multiplanar, multiecho pulse sequences of the brain and surrounding structures were obtained without intravenous contrast.  COMPARISON:  Head CT 11/20/2013 and MRI 10/07/2005  FINDINGS: There is no evidence of acute infarct, intracranial hemorrhage, mass, midline shift, or extra-axial fluid collection. Hippocampi are symmetric in size and signal. Foci of T2 hyperintensity in the periventricular white matter are compatible with moderate chronic small vessel ischemic disease, similar to the prior study. There is moderate  cerebral atrophy. Old, small infarcts are again seen in the brainstem, basal ganglia, thalamus, and deep cerebral white matter.  Orbits are unremarkable. Focal left posterior ethmoid air cell opacification is noted. Mastoid air cells are clear. Major intracranial  vascular flow voids are preserved.  IMPRESSION: 1. No evidence of acute intracranial abnormality. 2. Advanced chronic small vessel ischemic disease and remote lacunar infarcts.   Electronically Signed   By: Logan Bores   On: 11/21/2013 14:09   Dg Chest Port 1 View  11/21/2013   CLINICAL DATA:  Fluid overload  EXAM: PORTABLE CHEST - 1 VIEW  COMPARISON:  November 21, 2013  FINDINGS: There is patchy infiltrate in the left base. Elsewhere lungs are clear. Heart size and pulmonary vascularity are normal. No adenopathy. There is atherosclerotic change in the aorta.  IMPRESSION: Patchy infiltrate left base.   Electronically Signed   By: Lowella Grip M.D.   On: 11/21/2013 20:06   Dg Chest Port 1 View  11/20/2013   CLINICAL DATA:  Altered mental status.  EXAM: PORTABLE CHEST - 1 VIEW  COMPARISON:  02/09/2012.  FINDINGS: The heart is upper limits of normal in size and stable. There is tortuosity and calcification of the thoracic aorta. Diffuse increased interstitial markings suspicious for interstitial edema. No definite pleural effusions or focal infiltrates.  IMPRESSION: Chronic lung changes with possible superimposed interstitial edema. No infiltrates or effusions.   Electronically Signed   By: Kalman Jewels M.D.   On: 11/20/2013 17:42   Interpretation: This EEG is abnormal with moderate generalized continuous nonspecific slowing of cerebral activity. This pattern of slowing can be seen with a wide variety of encephalopathies, including metabolic and toxic encephalopathies, as well as degenerative CNS disorders. No evidence of an epileptic disorder was demonstrated.  ECG:  Sinus tachycardia with inferolateral ST-T changes, no significant change from prior  Impression: S/p CODE Blue event, unresponsive, bradycardia, respiratory distress Troponin elevation, NSTEMI vs Demand Ischemia  Probably mild CHF Acute respiratory failure Encephalopathy, toxic, metabolic, in setting of prior CVAs Acute on CKD HTN  uncontrolled DM  Recommendations: -Suspect that troponin elevation is more likely 2/2 demand ischemia -No urgent indications for invasive ischemic evaluation at this time.  Given patient currently medical status and comorbidities, not likely candidate for invasive assessment. -Continue to trend troponins -Continue monitor on telemetry -ASA  -Heparin likely 48-72hrs, consider discuss with neurology if cleared from stroke standpoint -TTE assess LV function -BP control -Strict I/Os, daily weights  Thank you for this consult.  We will follow along with you and make further recommendations as indicated.

## 2013-11-21 NOTE — Progress Notes (Signed)
Called urgently to patient's room due to brady rhythm and hypoxia.  Patient observed in semi-Fowler's position with agonal respirations.  Copious amounts of frothy sputum from patient's mouth.  Patient unresponsive.  Pupils unreactive.  Pulse present and ranging from 30's - 40's.  Respiratory assistance provided with ambu-bag and oxygen at 100%.  Coarse crackles auscultated throughout all lung fields.  Patient became more alert within the next hour.  MD notified.  Dr. Benjamine MolaVann also notified.  Family elected to change code status to full code after event.  Discussed moving patient to ICU due to change in status.

## 2013-11-22 DIAGNOSIS — J189 Pneumonia, unspecified organism: Secondary | ICD-10-CM

## 2013-11-22 DIAGNOSIS — J9600 Acute respiratory failure, unspecified whether with hypoxia or hypercapnia: Secondary | ICD-10-CM

## 2013-11-22 DIAGNOSIS — N189 Chronic kidney disease, unspecified: Secondary | ICD-10-CM

## 2013-11-22 DIAGNOSIS — I214 Non-ST elevation (NSTEMI) myocardial infarction: Secondary | ICD-10-CM

## 2013-11-22 DIAGNOSIS — G9340 Encephalopathy, unspecified: Secondary | ICD-10-CM

## 2013-11-22 DIAGNOSIS — R4182 Altered mental status, unspecified: Secondary | ICD-10-CM

## 2013-11-22 DIAGNOSIS — I517 Cardiomegaly: Secondary | ICD-10-CM

## 2013-11-22 DIAGNOSIS — N179 Acute kidney failure, unspecified: Secondary | ICD-10-CM

## 2013-11-22 DIAGNOSIS — I469 Cardiac arrest, cause unspecified: Secondary | ICD-10-CM

## 2013-11-22 LAB — COMPREHENSIVE METABOLIC PANEL
ALBUMIN: 2.8 g/dL — AB (ref 3.5–5.2)
ALT: 25 U/L (ref 0–35)
AST: 56 U/L — AB (ref 0–37)
Alkaline Phosphatase: 97 U/L (ref 39–117)
Anion gap: 21 — ABNORMAL HIGH (ref 5–15)
BUN: 46 mg/dL — ABNORMAL HIGH (ref 6–23)
CALCIUM: 8.9 mg/dL (ref 8.4–10.5)
CHLORIDE: 103 meq/L (ref 96–112)
CO2: 23 mEq/L (ref 19–32)
CREATININE: 2.34 mg/dL — AB (ref 0.50–1.10)
GFR calc Af Amer: 24 mL/min — ABNORMAL LOW (ref >90)
GFR calc non Af Amer: 21 mL/min — ABNORMAL LOW (ref >90)
Glucose, Bld: 232 mg/dL — ABNORMAL HIGH (ref 70–99)
Potassium: 3.6 mEq/L — ABNORMAL LOW (ref 3.7–5.3)
Sodium: 147 mEq/L (ref 137–147)
Total Bilirubin: <0.2 mg/dL — ABNORMAL LOW (ref 0.3–1.2)
Total Protein: 7.5 g/dL (ref 6.0–8.3)

## 2013-11-22 LAB — CBC WITH DIFFERENTIAL/PLATELET
Basophils Absolute: 0 10*3/uL (ref 0.0–0.1)
Basophils Relative: 0 % (ref 0–1)
EOS PCT: 0 % (ref 0–5)
Eosinophils Absolute: 0 10*3/uL (ref 0.0–0.7)
HEMATOCRIT: 35.4 % — AB (ref 36.0–46.0)
Hemoglobin: 11 g/dL — ABNORMAL LOW (ref 12.0–15.0)
LYMPHS ABS: 1.4 10*3/uL (ref 0.7–4.0)
LYMPHS PCT: 14 % (ref 12–46)
MCH: 27.2 pg (ref 26.0–34.0)
MCHC: 31.1 g/dL (ref 30.0–36.0)
MCV: 87.6 fL (ref 78.0–100.0)
MONO ABS: 1.1 10*3/uL — AB (ref 0.1–1.0)
MONOS PCT: 10 % (ref 3–12)
Neutro Abs: 8.1 10*3/uL — ABNORMAL HIGH (ref 1.7–7.7)
Neutrophils Relative %: 76 % (ref 43–77)
Platelets: 278 10*3/uL (ref 150–400)
RBC: 4.04 MIL/uL (ref 3.87–5.11)
RDW: 14.8 % (ref 11.5–15.5)
WBC: 10.6 10*3/uL — ABNORMAL HIGH (ref 4.0–10.5)

## 2013-11-22 LAB — GLUCOSE, CAPILLARY
GLUCOSE-CAPILLARY: 224 mg/dL — AB (ref 70–99)
GLUCOSE-CAPILLARY: 255 mg/dL — AB (ref 70–99)
Glucose-Capillary: 241 mg/dL — ABNORMAL HIGH (ref 70–99)
Glucose-Capillary: 276 mg/dL — ABNORMAL HIGH (ref 70–99)
Glucose-Capillary: 290 mg/dL — ABNORMAL HIGH (ref 70–99)

## 2013-11-22 LAB — LACTIC ACID, PLASMA
Lactic Acid, Venous: 1.3 mmol/L (ref 0.5–2.2)
Lactic Acid, Venous: 1.1 mmol/L (ref 0.5–2.2)

## 2013-11-22 LAB — TROPONIN I
TROPONIN I: 4.52 ng/mL — AB (ref <0.30)
Troponin I: 3.77 ng/mL (ref <0.30)

## 2013-11-22 LAB — URINE CULTURE
COLONY COUNT: NO GROWTH
CULTURE: NO GROWTH

## 2013-11-22 LAB — HEPARIN LEVEL (UNFRACTIONATED)
HEPARIN UNFRACTIONATED: 0.94 [IU]/mL — AB (ref 0.30–0.70)
HEPARIN UNFRACTIONATED: 0.65 [IU]/mL (ref 0.30–0.70)

## 2013-11-22 MED ORDER — METOPROLOL TARTRATE 1 MG/ML IV SOLN
5.0000 mg | Freq: Four times a day (QID) | INTRAVENOUS | Status: DC
  Administered 2013-11-22 – 2013-11-23 (×4): 5 mg via INTRAVENOUS
  Filled 2013-11-22 (×8): qty 5

## 2013-11-22 MED ORDER — CHLORHEXIDINE GLUCONATE 0.12 % MT SOLN
15.0000 mL | Freq: Two times a day (BID) | OROMUCOSAL | Status: DC
  Administered 2013-11-22 (×2): 15 mL via OROMUCOSAL
  Filled 2013-11-22 (×2): qty 15

## 2013-11-22 MED ORDER — HYDRALAZINE HCL 20 MG/ML IJ SOLN
10.0000 mg | Freq: Four times a day (QID) | INTRAMUSCULAR | Status: DC
  Administered 2013-11-22 – 2013-11-23 (×4): 10 mg via INTRAVENOUS
  Administered 2013-11-23: 10:00:00 via INTRAVENOUS
  Administered 2013-11-23 – 2013-11-24 (×4): 10 mg via INTRAVENOUS
  Filled 2013-11-22 (×3): qty 1
  Filled 2013-11-22 (×4): qty 0.5
  Filled 2013-11-22 (×2): qty 1
  Filled 2013-11-22 (×4): qty 0.5
  Filled 2013-11-22: qty 1

## 2013-11-22 MED ORDER — AMLODIPINE BESYLATE 10 MG PO TABS
10.0000 mg | ORAL_TABLET | Freq: Every day | ORAL | Status: DC
  Filled 2013-11-22: qty 1

## 2013-11-22 MED ORDER — ASPIRIN 300 MG RE SUPP
300.0000 mg | Freq: Every day | RECTAL | Status: DC
  Administered 2013-11-22 – 2013-12-02 (×11): 300 mg via RECTAL
  Filled 2013-11-22 (×11): qty 1

## 2013-11-22 MED ORDER — BIOTENE DRY MOUTH MT LIQD
15.0000 mL | Freq: Two times a day (BID) | OROMUCOSAL | Status: DC
  Administered 2013-11-23 – 2013-12-02 (×19): 15 mL via OROMUCOSAL

## 2013-11-22 MED ORDER — BIOTENE DRY MOUTH MT LIQD
15.0000 mL | Freq: Two times a day (BID) | OROMUCOSAL | Status: DC
  Administered 2013-11-22 (×2): 15 mL via OROMUCOSAL

## 2013-11-22 MED ORDER — KCL IN DEXTROSE-NACL 10-5-0.45 MEQ/L-%-% IV SOLN
INTRAVENOUS | Status: DC
  Administered 2013-11-22 – 2013-11-23 (×2): via INTRAVENOUS
  Administered 2013-11-25: 50 mL/h via INTRAVENOUS
  Administered 2013-11-25: via INTRAVENOUS
  Filled 2013-11-22 (×8): qty 1000

## 2013-11-22 MED ORDER — HYDRALAZINE HCL 20 MG/ML IJ SOLN
10.0000 mg | INTRAMUSCULAR | Status: DC | PRN
  Administered 2013-11-22: 20 mg via INTRAVENOUS
  Administered 2013-11-22: 40 mg via INTRAVENOUS
  Administered 2013-11-23: 20 mg via INTRAVENOUS
  Administered 2013-11-23: 30 mg via INTRAVENOUS
  Administered 2013-11-23: 20 mg via INTRAVENOUS
  Administered 2013-11-24: 30 mg via INTRAVENOUS
  Filled 2013-11-22: qty 2
  Filled 2013-11-22: qty 1
  Filled 2013-11-22 (×3): qty 2

## 2013-11-22 MED ORDER — CLONIDINE HCL 0.3 MG/24HR TD PTWK
0.3000 mg | MEDICATED_PATCH | TRANSDERMAL | Status: DC
  Administered 2013-11-22: 0.3 mg via TRANSDERMAL
  Filled 2013-11-22: qty 1

## 2013-11-22 MED ORDER — LABETALOL HCL 5 MG/ML IV SOLN
10.0000 mg | INTRAVENOUS | Status: DC | PRN
  Administered 2013-11-22: 30 mg via INTRAVENOUS
  Filled 2013-11-22: qty 8

## 2013-11-22 MED ORDER — HYDRALAZINE HCL 20 MG/ML IJ SOLN: 10.0000 mg | INTRAMUSCULAR | Status: DC | PRN

## 2013-11-22 MED ORDER — INSULIN GLARGINE 100 UNIT/ML ~~LOC~~ SOLN
15.0000 [IU] | Freq: Every day | SUBCUTANEOUS | Status: DC
  Administered 2013-11-22: 15 [IU] via SUBCUTANEOUS
  Filled 2013-11-22 (×2): qty 0.15

## 2013-11-22 MED ORDER — HEPARIN (PORCINE) IN NACL 100-0.45 UNIT/ML-% IJ SOLN
850.0000 [IU]/h | INTRAMUSCULAR | Status: DC
  Administered 2013-11-22 (×2): 850 [IU]/h via INTRAVENOUS
  Filled 2013-11-22 (×2): qty 250

## 2013-11-22 MED FILL — Medication: Qty: 1 | Status: AC

## 2013-11-22 NOTE — Progress Notes (Signed)
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 [facility].   SIGNIFICANT EVENTS / STUDIES:  [facility] concern for sepsis 7/24 CT head: Chronic involutional change and chronic lacunar infarcts, stable from prior study. No acute findings 7/24 EEG: Moderate generalized continuous nonspecific slowing of cerebral activity. No epileptiform activity 7/25 CTAbd: no acute abn's 7/25 MRI brain: No evidence of acute intracranial abnormality. Advanced chronic small vessel ischemic disease and remote lacunar infarcts. 7/25Cardiac arrest, ROSC obtained with bagging alone. transferred to ICU/PCCM service. Cardiac markers positive (trop I 4.52) 7/26 DC naloxone gtt. Severe hypertension 7/26 TTE:  7/26 Palliative Care consultation  LINES / TUBES:   CULTURES: Blood cultures x 2 7/24, NGTD Urine culture 7/24 NGTD Resp Viral Panel (ordered 7/25) Sputum Culture (7/25)  ANTIBIOTICS: Zosyn 7/24 - Vanc 7/24 -   SUBJECTIVE:  NAD. RASS 0. Severe hypertension  VITAL SIGNS: Temp:  [99.6 F (37.6 C)-101.2 F (38.4 C)] 101.2 F (38.4 C) (07/26 1238) Pulse Rate:  [40-129] 101 (07/26 1238) Resp:  [4-29] 22 (07/26 1238) BP: (162-236)/(47-134) 172/50 mmHg (07/26 1230) SpO2:  [67 %-100 %] 96 % (07/26 1238) Weight:  [75.9 kg (167 lb 5.3 oz)] 75.9 kg (167 lb 5.3 oz) (07/26 0500) HEMODYNAMICS:   VENTILATOR SETTINGS:   INTAKE / OUTPUT: Intake/Output     07/25 0701 - 07/26 0700 07/26 0701 - 07/27 0700   I.V. (mL/kg) 636.8 (8.4) 315.5 (4.2)   IV Piggyback 137.5 37.5   Total Intake(mL/kg) 774.3 (10.2) 353 (4.7)    Urine (mL/kg/hr) 1550 (0.9) 950 (1.4)   Total Output 1550 950   Net -775.7 -597        Urine Occurrence     Stool Occurrence       PHYSICAL EXAMINATION: General: NAD Neuro:  RASS 0, appears to have expressive aphasia, MAEs HEENT: WNL Neck: JVP not well visualized Cardiovascular: tachy, regular, no M Lungs:  L>R basialr crackles Abdomen:  S/NT/ND/(+)BS Musculoskeletal:  R AKA, no edema   LABS:  CBC  Recent Labs Lab 11/21/13 0257 11/21/13 2055 11/22/13 0230  WBC 11.6* 14.1* 10.6*  HGB 11.5* 12.1 11.0*  HCT 37.2 39.8 35.4*  PLT 268 331 278   Coag's  Recent Labs Lab 11/21/13 2055  APTT 34  INR 1.18   BMET  Recent Labs Lab 11/21/13 0257 11/21/13 2055 11/22/13 0230  NA 145 148* 147  K 4.0 4.2 3.6*  CL 104 106 103  CO2 24 17* 23  BUN 36* 41* 46*  CREATININE 2.69* 2.34* 2.34*  GLUCOSE 110* 209* 232*   Electrolytes  Recent Labs Lab 11/20/13 2238 11/21/13 0257 11/21/13 2055 11/22/13 0230  CALCIUM  --  8.8 9.3 8.9  MG 2.3  --  2.3  --   PHOS  --   --  5.4*  --    Sepsis Markers  Recent Labs Lab 11/21/13 2053 11/22/13 0230 11/22/13 0810  LATICACIDVEN 4.0* 1.3 1.1   ABG  Recent Labs Lab 11/20/13 2304 11/21/13 0356 11/21/13 2005  PHART 7.291* 7.297* 7.375  PCO2ART 48.6* 52.0* 37.0  PO2ART 56.8* 69.7* 62.0*   Liver Enzymes  Recent Labs Lab 11/21/13 0257 11/21/13 2055 11/22/13 0230  AST 65* 69* 56*  ALT 26 28 25   ALKPHOS 113 113 97  BILITOT <0.2* 0.3 <0.2*  ALBUMIN 2.9* 3.1* 2.8*   Cardiac Enzymes  Recent Labs Lab 11/20/13 1705 11/21/13 1900 11/22/13 0230 11/22/13 0810  TROPONINI  --  1.35* 3.77* 4.52*  PROBNP 832.1*  --   --   --    Glucose  Recent Labs Lab 11/21/13 1834 11/21/13 2108 11/21/13 2334 11/22/13 0350 11/22/13 0811 11/22/13 1241  GLUCAP 197* 257* 230* 241* 224* 276*    Imaging   EKG: Personally reviewed. There are ST depressions in V3-V5 with some non-specific ST changes. ? 2/2 hypertrophy.    CXR: Personally reviewed. There is moderate bilateral patchy interstitial infiltrate with a new LLL opacity.   ASSESSMENT / PLAN:  PULMONARY A: Acute hypoxic respiratory failure  Probable LLL HCAP P:   Cotn supp O2 to maintain SpO2 > 92%  CARDIOVASCULAR A: NSTEMI Cardiac Arrest, etiology unclear - possibly due to resp depression Acute on Chronic Diastolic HF Severe hypertension  P:   Cards following Cont to cycle cardiac markers Scheduled hydralazine, metoprolol ordered Change clonidine patch to TTS 3 Cont PRN hydralazine and labetalol Poor candidate for invasive cardiac eval  RENAL A: AKI, non-oliguric CKD P:   Monitor BMET intermittently Monitor I/Os Correct electrolytes as indicated Poor candidate for long term HD  GASTROINTESTINAL A: NASH Dysphagia P:   All meds changed to IV, PR or transdermal NPO SLP eval requested Poor candidate for G tube  HEMATOLOGIC A: AOCD P:   DVT px: heparin gtt for ACS Monitor CBC intermittently Transfuse per usual ICU guidelines  INFECTIOUS A: LLL HCAP, NOS P:   Micro and abx as above  ENDOCRINE A: DM 2 Hypothroidism P:   Cont SSI Add Lantus at reduced dose (home dose is 66 units/d) Cont L thyroxine  NEUROLOGIC A: Encephalopathy - suspect due to excessive opioids, improving after removal of fentanyl patches Seizure DO  Persistent exp aphasia Severe baseline debilitation P:   DC naloxone Palliative Care consultation Neuro following   TODAY'S SUMMARY:   I have personally obtained a history, examined the patient, evaluated laboratory and imaging results, formulated the assessment and plan and placed orders.  CRITICAL CARE: The patient is critically ill with multiple organ systems failure and requires high complexity decision making for assessment and support, frequent evaluation and titration of therapies, application of advanced monitoring technologies and extensive interpretation of multiple  databases. Critical Care Time devoted to patient care services described in this note is 40 minutes.   Billy Fischer, MD ; [facility] [phone].  After 5:30 PM or weekends, call [phone]  11/22/2013, 4:01 PM

## 2013-11-22 NOTE — Progress Notes (Signed)
SLP Cancellation Note  Patient Details Name: Allison Washington MRN: [id number] DOB: 12-10-1945   Cancelled treatment:       Reason Eval/Treat Not Completed: Medical issues which prohibited therapy (in light of code blue event last evening, will defer swallow tx until next date.  note pt now febrile and lung fields with rhonchi)   Donavan Burnetamara Coralynn Gaona, MS [facility] [phone]

## 2013-11-22 NOTE — Consult Note (Signed)
Patient Allison Washington      DOB: 04-10-1946      UJW:[id number]     Consult Note from the Palliative Medicine Team at [facility]    Consult Requested by: Dr Allison Washington     PCP: Allison Relic, MD Reason for Consultation:Possible narcotic overdose, goals of cancer Phone Number:[phone]  Assessment/Recommendations: 68 yo female with PMHx of CVA w/aphasia, DM, HTN, R AKA, seizures who was found unresponsive at nursing home. Transfer here revealed likely sepsis, concern for opioid induced oversedation, AKI on CKD.    1.  Code Status: Full Code  2. Goals of Care: Spoke with son Allison Washington and his wife at bedside. Patient also much more interactive today.  They are very pleased to see her much more alert today. In asking about what concerns they had or questions going forward, they remain curious as to what caused this event, will she get back to baseline, and "have they torn up that DNR order yet".  Certainly seems to be a delicate subject to family. They state she filled that out in 2013 when she was depressed and not talking with family as much. Report she has been doing much better lately and enjoying food, watching tv, spending time with family.  Despite strokes and aphasia, she was able to verbalize one word answers and could indicate things she wanted. Allison Washington is able to confirm this as well. In talking about what is important in regards to code status, they seem to agree in general with the principle that it should not be used in long term just to maintain life.  Their goal is to do all they can to get her back to nursing home. Briefly discussed where life support (especially if not quick resolution to event) can be limited in achieving goal.  Sensitive subject which needs to be addressed delicately though.  Daughter Allison Washington had just left hospital and I will try to discuss care with her as well.    3. Symptom Management:  Encephalopathy- resolving. Possibly multifactorial.  Narcan drip off.   Would not restart fentanyl patch  Chronic Pain- Family unsure why she was on pain medicine or when it was started. Patient currently denies pain and unable to express why she was on pain medicine. Allison Washington confirms that she has been on fentanyl patch since 2013 and report she would request PRN oxycodone at night to help her sleep, but unaware of where her pain is.  Per [employer] documentation, she used for phantom limb pain.  Would avoid fentanyl patch and she may nto need opioids at all.  If pain occurs would start with PRN tylenol (which could even be scheduled) and eval from there.   4. Psychosocial/Spiritual: Has brother in [place].  Son Allison Washington and daughter Allison Washington live locally.     Brief HPI: 68 yo female with PMHx of CVA w/aphasia, DM, HTN, R BKA, seizures who was found unresponsive at nursing home. Transfer here revealed likely sepsis, concern for opioid induced oversedation, AKI on CKD. vomiting was also reported on presentation. She was initially placed on narcan drip. After this was discontinued, she developed bradycardia and then PEA arrest. She was initially DNR, but code status changed to full during this admission. Post event, she was found to have NSTEMi presumed 2/2 demand ischemia.    Allison Washington is much more alert today.  Able to follow commands for me but unable to fully verbalize words.  Has struggled with this for several years per son and related  to prior history of multiple strokes.  She has had aphasia for >10 years but could verbalize few words which were understandable.  She currently denies any pain, N/V, dyspnea, cough.  Has some dry mouth and thick secretions.  Per family she has not had seizures in several years and no seizure like activity has been documented.   ROS: Patient nonverbal but some system review illicited as above    PMH:  Past Medical History  Diagnosis Date  . Hemiplegia and hemiparesis     5x cva's known history of bilateral  subcortical strokes  and pseudobulbar, MRI with acute infarct of the left basal ganglia, posterior limb of  . Diabetes mellitus     Prone to hypoglycemia  . Aphagia     Status post numerous  . Dysphagia   . Peripheral vascular disease, unspecified   . Arterial disease   . Depressive disorder   . Hypothyroid   . Seizures   . MRSA infection     History of MRSA in the left leg wound and sacrum in the past-has been seen by vascular surgery and declined any type of intervention including arteriogram  . Diverticulosis     GI bleed in 2007-s/p endoscopy and colonoscopy in 2007 s/p massive GIB consittent with Diveerticulosis  . DVT (deep venous thrombosis)     Not on Coumadin  . Anemia     Chronic  . Non-smoker   . Stroke   . Hypertension   . History of DVT (deep vein thrombosis) 03/06/2011  . Nursing home acquired MRSA infection 03/06/2011    History of MRSA in the left leg wound and sacrum in the past-has been seen by vascular surgery and declined any type of intervention including arteriogram   . SEIZURE DISORDER 12/17/2006    Qualifier: Diagnosis of  By: Clent Ridges MD, [facility]      PSH: Past Surgical History  Procedure Laterality Date  . Above knee leg amputation    . Femoral-popliteal bypass graft     I have reviewed the FH and SH and  If appropriate update it with new information. No Known Allergies Scheduled Meds: . antiseptic oral rinse  15 mL Mouth Rinse q12n4p  . aspirin  300 mg Rectal Daily  . chlorhexidine  15 mL Mouth Rinse BID  . cloNIDine  0.3 mg Transdermal Weekly  . hydrALAZINE  10 mg Intravenous Q6H  . insulin aspart  0-9 Units Subcutaneous 6 times per day  . insulin glargine  15 Units Subcutaneous Daily  . metoprolol  5 mg Intravenous 4 times per day  . piperacillin-tazobactam (ZOSYN)  IV  3.375 g Intravenous Q8H  . sodium chloride  3 mL Intravenous Q12H  . vancomycin  1,000 mg Intravenous Q48H   Continuous Infusions: . sodium chloride 10 mL/hr at 11/22/13 0307  . dextrose 5 % and  0.45 % NaCl with KCl 10 mEq/L 50 mL/hr at 11/22/13 1135  . heparin 850 Units/hr (11/22/13 1115)   PRN Meds:.sodium chloride, hydrALAZINE, labetalol, sodium chloride    BP 172/50  Pulse 101  Temp(Src) 101.2 F (38.4 C) (Oral)  Resp 22  Ht 5\' 3"  (1.6 m)  Wt 75.9 kg (167 lb 5.3 oz)  BMI 29.65 kg/m2  SpO2 96%     Intake/Output Summary (Last 24 hours) at 11/22/13 1443 Last data filed at 11/22/13 1400  Gross per 24 hour  Intake 1030.53 ml  Output   2500 ml  Net -1469.47 ml    Physical Exam:  General: Alert, NAD HEENT:  [place], PERRL, sclera anicteric Chest:   CTAB, symm expansion CVS: RRR Abdomen: soft, NT, +BS Ext: R BKA Neuro: follows simple commands  Labs: CBC    Component Value Date/Time   WBC 10.6* 11/22/2013 0230   RBC 4.04 11/22/2013 0230   HGB 11.0* 11/22/2013 0230   HCT 35.4* 11/22/2013 0230   PLT 278 11/22/2013 0230   MCV 87.6 11/22/2013 0230   MCH 27.2 11/22/2013 0230   MCHC 31.1 11/22/2013 0230   RDW 14.8 11/22/2013 0230   LYMPHSABS 1.4 11/22/2013 0230   MONOABS 1.1* 11/22/2013 0230   EOSABS 0.0 11/22/2013 0230   BASOSABS 0.0 11/22/2013 0230    BMET    Component Value Date/Time   NA 147 11/22/2013 0230   K 3.6* 11/22/2013 0230   CL 103 11/22/2013 0230   CO2 23 11/22/2013 0230   GLUCOSE 232* 11/22/2013 0230   BUN 46* 11/22/2013 0230   CREATININE 2.34* 11/22/2013 0230   CALCIUM 8.9 11/22/2013 0230   CALCIUM 9.4 11/17/2007 1530   GFRNONAA 21* 11/22/2013 0230   GFRAA 24* 11/22/2013 0230    CMP     Component Value Date/Time   NA 147 11/22/2013 0230   K 3.6* 11/22/2013 0230   CL 103 11/22/2013 0230   CO2 23 11/22/2013 0230   GLUCOSE 232* 11/22/2013 0230   BUN 46* 11/22/2013 0230   CREATININE 2.34* 11/22/2013 0230   CALCIUM 8.9 11/22/2013 0230   CALCIUM 9.4 11/17/2007 1530   PROT 7.5 11/22/2013 0230   ALBUMIN 2.8* 11/22/2013 0230   AST 56* 11/22/2013 0230   ALT 25 11/22/2013 0230   ALKPHOS 97 11/22/2013 0230   BILITOT <0.2* 11/22/2013 0230   GFRNONAA 21* 11/22/2013 0230    GFRAA 24* 11/22/2013 0230   11/21/13 MRI Brain IMPRESSION:  1. No evidence of acute intracranial abnormality.  2. Advanced chronic small vessel ischemic disease and remote lacunar  infarcts.    7/25 EEG Interpretation: This EEG is abnormal with moderate generalized continuous nonspecific slowing of cerebral activity. This pattern of slowing can be seen with a wide variety of encephalopathies, including metabolic and toxic encephalopathies, as well as degenerative CNS disorders. No evidence of an epileptic disorder was demonstrated.  7/24 CT Chest/ABD IMPRESSION:  There is no demonstrable bowel obstruction. No evidence suggesting  bowel ischemia, although there is extensive atherosclerotic change  in the aorta and proximal mesenteric arteries.  Persistent hiatal hernia with distal esophageal wall thickening,  probably representing chronic reflux esophagitis.  Prominent liver without focal lesion. Stable extrahepatic biliary  duct dilatation. The stability of this finding since 2012 is  consistent with benign etiology and may at least in part be due to  post cholecystectomy state.  Presumed partially calcified hematoma posterior to the right sacrum,  stable.  Thinning of the anterior rectus muscle without frank hernia.  Rectum distended with stool.  Chronic and stable adrenal hypertrophy bilaterally. Clinical  significance of this finding is uncertain.   7/25 CXR IMPRESSION:  Patchy infiltrate left base.   Time In: 245 Time Out: 400 Total time: 75 minutes   Greater than 50%  of this time was spent counseling and coordinating care related to the above assessment and plan.   Orvis BrillAaron J. Audra Kagel D.O. Palliative Medicine Team at [facility]  Pager: [phone] Team Phone: [phone]

## 2013-11-22 NOTE — Progress Notes (Signed)
ANTICOAGULATION CONSULT NOTE - Follow Up Consult  Pharmacy Consult for Heparin Indication: chest pain/ACS  No Known Allergies  Patient Measurements: Height: 5\' 3"  (160 cm) Weight: 167 lb 5.3 oz (75.9 kg) IBW/kg (Calculated) : 52.4 Heparin Dosing Weight: 70 kg  Vital Signs: Temp: 100.6 F (38.1 C) (07/26 0348) Temp src: Oral (07/26 0348) BP: 227/70 mmHg (07/26 0844) Pulse Rate: 91 (07/26 0844)  Labs:  Recent Labs  11/21/13 0257 11/21/13 1900 11/21/13 2055 11/22/13 0230 11/22/13 0810 11/22/13 0941  HGB 11.5*  --  12.1 11.0*  --   --   HCT 37.2  --  39.8 35.4*  --   --   PLT 268  --  331 278  --   --   APTT  --   --  34  --   --   --   LABPROT  --   --  15.0  --   --   --   INR  --   --  1.18  --   --   --   HEPARINUNFRC  --   --   --   --   --  0.94*  CREATININE 2.69*  --  2.34* 2.34*  --   --   TROPONINI  --  1.35*  --  3.77* 4.52*  --     Estimated Creatinine Clearance: 23.4 ml/min (by C-G formula based on Cr of 2.34).   Medications:  Infusions:  . sodium chloride 10 mL/hr at 11/22/13 0307  . dextrose 5 % and 0.45 % NaCl with KCl 10 mEq/L    . heparin 1,000 Units/hr (11/21/13 2119)    Assessment: 68 year old female with altered mental status being treated for pneumonia and possible narcotic-related encephalopathy with Narcan, now found to have NSTEMI with elevated troponin. The initial heparin level is above goal (HL= 0.94) on 1000 units/hr. Noted no plans for cath.  Goal of Therapy:  Heparin level 0.3-0.7 units/ml Monitor platelets by anticoagulation protocol: Yes   Plan:  -Decrease heparin to 850 units/hr -Heparin level in 8hrs  Harland GermanAndrew Shantika Bermea, Pharm D 11/22/2013 10:58 AM

## 2013-11-22 NOTE — Progress Notes (Signed)
ANTICOAGULATION CONSULT NOTE - Follow Up Consult  Pharmacy Consult for Heparin Indication: chest pain/ACS  No Known Allergies  Patient Measurements: Height: 5\' 3"  (160 cm) Weight: 167 lb 5.3 oz (75.9 kg) IBW/kg (Calculated) : 52.4 Heparin Dosing Weight: 70 kg  Vital Signs: Temp: 99.4 F (37.4 C) (07/26 2000) Temp src: Oral (07/26 2000) BP: 170/37 mmHg (07/26 2000) Pulse Rate: 95 (07/26 2000)  Labs:  Recent Labs  11/21/13 0257 11/21/13 1900 11/21/13 2055 11/22/13 0230 11/22/13 0810 11/22/13 0941 11/22/13 1925  HGB 11.5*  --  12.1 11.0*  --   --   --   HCT 37.2  --  39.8 35.4*  --   --   --   PLT 268  --  331 278  --   --   --   APTT  --   --  34  --   --   --   --   LABPROT  --   --  15.0  --   --   --   --   INR  --   --  1.18  --   --   --   --   HEPARINUNFRC  --   --   --   --   --  0.94* 0.65  CREATININE 2.69*  --  2.34* 2.34*  --   --   --   TROPONINI  --  1.35*  --  3.77* 4.52*  --   --     Estimated Creatinine Clearance: 23.4 ml/min (by C-G formula based on Cr of 2.34).   Medications:  Infusions:  . sodium chloride 10 mL/hr at 11/22/13 0307  . dextrose 5 % and 0.45 % NaCl with KCl 10 mEq/L 50 mL/hr at 11/22/13 1135  . heparin 850 Units/hr (11/22/13 1815)    Assessment: 68 year old female with altered mental status being treated for pneumonia and possible narcotic-related encephalopathy with Narcan, now found to have NSTEMI with elevated troponin.  Heparin level therapeutic  Goal of Therapy:  Heparin level 0.3-0.7 units/ml Monitor platelets by anticoagulation protocol: Yes   Plan:  -Continue heparin at 850 units/hr -Follow up AM labs  Thank you Okey RegalLisa Jarelly Rinck, PharmD [phone]  11/22/2013 8:18 PM

## 2013-11-22 NOTE — Progress Notes (Signed)
    Subjective:  Patient opens eyes and shakes head to questions. She denies pain or dyspnea.   Objective:  Filed Vitals:   11/22/13 0400 11/22/13 0500 11/22/13 0600 11/22/13 0700  BP: 162/70 198/84 188/81 215/134  Pulse: 99 102 96 102  Temp:      TempSrc:      Resp: 18 23 21 28   Height:      Weight:  167 lb 5.3 oz (75.9 kg)    SpO2: 97% 98% 96% 98%    Intake/Output from previous day:  Intake/Output Summary (Last 24 hours) at 11/22/13 0817 Last data filed at 11/22/13 0800  Gross per 24 hour  Intake 736.83 ml  Output   1925 ml  Net -1188.17 ml    Physical Exam: Physical exam: Well-developed chronically ill appearing in no acute distress.  Skin is warm and dry.  HEENT is normal.  Neck is supple.  Chest with diffuse rhonchi Cardiovascular exam is regular rate and rhythm.  Abdominal exam nontender or distended. No masses palpated. Extremities previous right AKA; chronic skin changes LLE     Lab Results: Basic Metabolic Panel:  Recent Labs  16/01/9606/24/15 2238  11/21/13 2055 11/22/13 0230  NA  --   < > 148* 147  K  --   < > 4.2 3.6*  CL  --   < > 106 103  CO2  --   < > 17* 23  GLUCOSE  --   < > 209* 232*  BUN  --   < > 41* 46*  CREATININE  --   < > 2.34* 2.34*  CALCIUM  --   < > 9.3 8.9  MG 2.3  --  2.3  --   PHOS  --   --  5.4*  --   < > = values in this interval not displayed. CBC:  Recent Labs  11/21/13 2055 11/22/13 0230  WBC 14.1* 10.6*  NEUTROABS 9.0* 8.1*  HGB 12.1 11.0*  HCT 39.8 35.4*  MCV 89.6 87.6  PLT 331 278   Cardiac Enzymes:  Recent Labs  11/21/13 1900 11/22/13 0230  TROPONINI 1.35* 3.77*     Assessment/Plan:  1 non-ST elevation myocardial infarction-the patient suffered a bradycardic arrest in the setting of respiratory distress based on review of notes. Her troponin is increased most likely related to demand ischemia superimposed on underlying coronary disease. She has multiple medical problems including prior CVAs and renal  failure. She also resides in a nursing home. She is not a candidate for aggressive cardiac evaluation such as cardiac catheterization. I discussed this with the patient's son in law and daughter and they are in agreement. We will therefore plan medical therapy. Continue aspirin and heparin. Add metoprolol 5 mg IV every 6 hours and transition to oral later as tolerated. Add statin later if encephalopathy improves. Echocardiogram for LV function. 2 hypertension-add metoprolol. Increase Norvasc to 10 mg daily. Add additional medications as needed. 3 Acute on chronic stage III renal failure-follow renal function. 4 encephalopathy-further evaluation per neurology and primary care. 5 possible sepsis-continue antibiotics. Await cultures. Patient's prognosis appears to be poor for multiple medical problems. Palliative care consult would be appropriate.  Olga MillersBrian Crenshaw 11/22/2013, 8:17 AM

## 2013-11-22 NOTE — Progress Notes (Addendum)
DR Sung AmabileSIMONDS INFORMED OF CURRENT B/P :214/88 (133) . LAST LABETOLOL GIVEN 30 MIN AGO ( AND Q 2HRS LAST NOC .

## 2013-11-22 NOTE — Progress Notes (Signed)
Pharmacist Heart Failure Core Measure Documentation  Assessment: Allison Washington has an EF documented as 55-65% on 02/10/2012 by echo.  Rationale: Heart failure patients with left ventricular systolic dysfunction (LVSD) and an EF < 40% should be prescribed an angiotensin converting enzyme inhibitor (ACEI) or angiotensin receptor blocker (ARB) at discharge unless a contraindication is documented in the medical record.  This patient is not currently on an ACEI or ARB for HF.  This note is being placed in the record in order to provide documentation that a contraindication to the use of these agents is present for this encounter.  ACE Inhibitor or Angiotensin Receptor Blocker is contraindicated (specify all that apply)  []   ACEI allergy AND ARB allergy []   Angioedema []   Moderate or severe aortic stenosis []   Hyperkalemia []   Hypotension []   Renal artery stenosis [x]   Worsening renal function, preexisting renal disease or dysfunction   Allison Washington 11/22/2013 10:00 AM

## 2013-11-22 NOTE — Progress Notes (Signed)
Echo Lab  2D Echocardiogram completed.  Ailey Wessling L Cheyenne Bordeaux, RDCS 11/22/2013 10:25 AM

## 2013-11-23 ENCOUNTER — Inpatient Hospital Stay (HOSPITAL_COMMUNITY): Payer: Medicare Other

## 2013-11-23 DIAGNOSIS — IMO0002 Reserved for concepts with insufficient information to code with codable children: Secondary | ICD-10-CM

## 2013-11-23 DIAGNOSIS — I5033 Acute on chronic diastolic (congestive) heart failure: Secondary | ICD-10-CM

## 2013-11-23 DIAGNOSIS — I214 Non-ST elevation (NSTEMI) myocardial infarction: Secondary | ICD-10-CM

## 2013-11-23 LAB — RESPIRATORY VIRUS PANEL
Adenovirus: NOT DETECTED
Influenza A H1: NOT DETECTED
Influenza A H3: NOT DETECTED
Influenza A: NOT DETECTED
Influenza B: NOT DETECTED
Metapneumovirus: NOT DETECTED
PARAINFLUENZA 2 A: NOT DETECTED
Parainfluenza 1: NOT DETECTED
Parainfluenza 3: NOT DETECTED
RESPIRATORY SYNCYTIAL VIRUS A: NOT DETECTED
RESPIRATORY SYNCYTIAL VIRUS B: NOT DETECTED
Rhinovirus: NOT DETECTED

## 2013-11-23 LAB — GLUCOSE, CAPILLARY
Glucose-Capillary: 196 mg/dL — ABNORMAL HIGH (ref 70–99)
Glucose-Capillary: 261 mg/dL — ABNORMAL HIGH (ref 70–99)
Glucose-Capillary: 205 mg/dL — ABNORMAL HIGH (ref 70–99)
Glucose-Capillary: 176 mg/dL — ABNORMAL HIGH (ref 70–99)
Glucose-Capillary: 265 mg/dL — ABNORMAL HIGH (ref 70–99)

## 2013-11-23 LAB — CBC WITH DIFFERENTIAL/PLATELET
BASOS ABS: 0 10*3/uL (ref 0.0–0.1)
BASOS PCT: 0 % (ref 0–1)
Eosinophils Absolute: 0 10*3/uL (ref 0.0–0.7)
Eosinophils Relative: 0 % (ref 0–5)
HCT: 32.9 % — ABNORMAL LOW (ref 36.0–46.0)
Hemoglobin: 10.3 g/dL — ABNORMAL LOW (ref 12.0–15.0)
Lymphocytes Relative: 18 % (ref 12–46)
Lymphs Abs: 2.1 10*3/uL (ref 0.7–4.0)
MCH: 27.3 pg (ref 26.0–34.0)
MCHC: 31.3 g/dL (ref 30.0–36.0)
MCV: 87.3 fL (ref 78.0–100.0)
Monocytes Absolute: 1.3 10*3/uL — ABNORMAL HIGH (ref 0.1–1.0)
Monocytes Relative: 12 % (ref 3–12)
NEUTROS ABS: 7.8 10*3/uL — AB (ref 1.7–7.7)
Neutrophils Relative %: 70 % (ref 43–77)
Platelets: 257 10*3/uL (ref 150–400)
RBC: 3.77 MIL/uL — ABNORMAL LOW (ref 3.87–5.11)
RDW: 15 % (ref 11.5–15.5)
WBC: 11.2 10*3/uL — AB (ref 4.0–10.5)

## 2013-11-23 LAB — COMPREHENSIVE METABOLIC PANEL
ALK PHOS: 75 U/L (ref 39–117)
ALT: 21 U/L (ref 0–35)
AST: 40 U/L — ABNORMAL HIGH (ref 0–37)
Albumin: 2.5 g/dL — ABNORMAL LOW (ref 3.5–5.2)
Anion gap: 18 — ABNORMAL HIGH (ref 5–15)
BUN: 54 mg/dL — ABNORMAL HIGH (ref 6–23)
CO2: 23 mEq/L (ref 19–32)
CREATININE: 2.26 mg/dL — AB (ref 0.50–1.10)
Calcium: 8.8 mg/dL (ref 8.4–10.5)
Chloride: 106 mEq/L (ref 96–112)
GFR calc non Af Amer: 22 mL/min — ABNORMAL LOW (ref >90)
GFR, EST AFRICAN AMERICAN: 25 mL/min — AB (ref >90)
GLUCOSE: 227 mg/dL — AB (ref 70–99)
POTASSIUM: 3.1 meq/L — AB (ref 3.7–5.3)
Sodium: 147 mEq/L (ref 137–147)
Total Protein: 6.7 g/dL (ref 6.0–8.3)

## 2013-11-23 LAB — HEPARIN LEVEL (UNFRACTIONATED): Heparin Unfractionated: 0.67 IU/mL (ref 0.30–0.70)

## 2013-11-23 LAB — TROPONIN I: Troponin I: 2.84 ng/mL (ref <0.30)

## 2013-11-23 MED ORDER — METOPROLOL TARTRATE 1 MG/ML IV SOLN
5.0000 mg | INTRAVENOUS | Status: DC
  Administered 2013-11-23 – 2013-11-24 (×9): 5 mg via INTRAVENOUS
  Filled 2013-11-23 (×8): qty 5

## 2013-11-23 MED ORDER — HEPARIN SODIUM (PORCINE) 5000 UNIT/ML IJ SOLN
5000.0000 [IU] | Freq: Three times a day (TID) | INTRAMUSCULAR | Status: DC
  Administered 2013-11-23 – 2013-11-27 (×13): 5000 [IU] via SUBCUTANEOUS
  Filled 2013-11-23 (×22): qty 1

## 2013-11-23 MED ORDER — INSULIN GLARGINE 100 UNIT/ML ~~LOC~~ SOLN
25.0000 [IU] | Freq: Every day | SUBCUTANEOUS | Status: DC
  Administered 2013-11-23 – 2013-11-25 (×3): 25 [IU] via SUBCUTANEOUS
  Filled 2013-11-23 (×4): qty 0.25

## 2013-11-23 MED ORDER — POTASSIUM CHLORIDE 10 MEQ/100ML IV SOLN
10.0000 meq | INTRAVENOUS | Status: AC
  Administered 2013-11-23 (×3): 10 meq via INTRAVENOUS
  Filled 2013-11-23 (×3): qty 100

## 2013-11-23 MED ORDER — JEVITY 1.2 CAL PO LIQD
1000.0000 mL | ORAL | Status: DC
  Administered 2013-11-23: 20 mL/h
  Filled 2013-11-23 (×3): qty 1000

## 2013-11-23 NOTE — Progress Notes (Signed)
Patient ID: Amado Nashatricia Brugh, female   DOB: 24-Dec-1945, 68 y.o.   MRN: [id number]    Subjective:  Extubated, awake/alert, follows commands, comfortable-appearing (aphasic).   Scheduled Meds: . antiseptic oral rinse  15 mL Mouth Rinse BID  . aspirin  300 mg Rectal Daily  . cloNIDine  0.3 mg Transdermal Weekly  . hydrALAZINE  10 mg Intravenous Q6H  . insulin aspart  0-9 Units Subcutaneous 6 times per day  . insulin glargine  25 Units Subcutaneous Daily  . metoprolol  5 mg Intravenous Q4H  . piperacillin-tazobactam (ZOSYN)  IV  3.375 g Intravenous Q8H  . potassium chloride  10 mEq Intravenous Q1 Hr x 3  . sodium chloride  3 mL Intravenous Q12H  . vancomycin  1,000 mg Intravenous Q48H   Continuous Infusions: . sodium chloride 10 mL/hr at 11/22/13 0307  . dextrose 5 % and 0.45 % NaCl with KCl 10 mEq/L 50 mL/hr at 11/22/13 1135  . heparin 850 Units/hr (11/22/13 1815)   PRN Meds:.sodium chloride, hydrALAZINE, labetalol, sodium chloride    Objective:  Filed Vitals:   11/23/13 0400 11/23/13 0500 11/23/13 0600 11/23/13 0700  BP: 204/55 180/51 127/34 176/41  Pulse: 96 103 91 99  Temp: 99 F (37.2 C)   99.5 F (37.5 C)  TempSrc: Oral   Oral  Resp: 22 31 24 25   Height:      Weight:      SpO2: 96% 96% 93% 94%    Intake/Output from previous day:  Intake/Output Summary (Last 24 hours) at 11/23/13 0827 Last data filed at 11/23/13 [id number]  Gross per 24 hour  Intake 1565.6 ml  Output   1245 ml  Net  320.6 ml    Physical Exam: Physical exam: Well-developed chronically ill appearing in no acute distress.  Skin is warm and dry.  HEENT is normal.  Neck is supple, no JVD Chest with diffuse rhonchi Cardiovascular exam is regular rate and rhythm, +S4.  Abdominal exam nontender or distended. No masses palpated. Extremities previous right AKA; chronic skin changes LLE  Lab Results: Basic Metabolic Panel:  Recent Labs  78/46/9607/24/15 2238  11/21/13 2055 11/22/13 0230 11/23/13 0244    NA  --   < > 148* 147 147  K  --   < > 4.2 3.6* 3.1*  CL  --   < > 106 103 106  CO2  --   < > 17* 23 23  GLUCOSE  --   < > 209* 232* 227*  BUN  --   < > 41* 46* 54*  CREATININE  --   < > 2.34* 2.34* 2.26*  CALCIUM  --   < > 9.3 8.9 8.8  MG 2.3  --  2.3  --   --   PHOS  --   --  5.4*  --   --   < > = values in this interval not displayed. CBC:  Recent Labs  11/22/13 0230 11/23/13 0244  WBC 10.6* 11.2*  NEUTROABS 8.1* 7.8*  HGB 11.0* 10.3*  HCT 35.4* 32.9*  MCV 87.6 87.3  PLT 278 257   Cardiac Enzymes:  Recent Labs  11/22/13 0230 11/22/13 0810 11/23/13 0244  TROPONINI 3.77* 4.52* 2.84*     Assessment/Plan:  68 yo with history of CVAs and aphasia was admitted with respiratory arrest probably due to aspiration PNA and altered mental status from narcotic overdose.   1. NSTEMI: the patient suffered a bradycardic arrest in the setting of respiratory distress based on  review of notes. Her troponin is increased most likely related to demand ischemia superimposed on underlying coronary disease. She has multiple medical problems including prior CVAs and renal failure. She also resides in a nursing home. She is not a candidate for aggressive cardiac evaluation such as cardiac catheterization.  Dr. Jens Som discussed this with the patient's son in law and daughter yesterday and they are in agreement. We will therefore plan medical therapy. Continue aspirin. Continue IV metoprolol while NPO and transition to oral later as tolerated. Add statin later when she is not NPO. Echocardiogram for LV function, awaiting.  Can stop heparin gtt today. 2. Hypertension: Increase metoprolol to q4 hrs IV.  Has prn hydralazine.  Awaiting swallow study to see if she can take po. 3. Acute on chronic stage III renal failure: Stable, follow renal function. 4. Encephalopathy: Baseline aphasia.  Improved today, suspect altered due to narcotic use.  5. Suspect aspiration PNA: On abx, to have swallow study today  and NPO until then.   Patient's prognosis appears to be poor for multiple medical problems. Palliative care consult would be appropriate.  Cardiology to sign off, call with questions.   Marca Ancona 11/23/2013, 8:27 AM

## 2013-11-23 NOTE — Progress Notes (Signed)
eLink Physician-Brief Progress Note Patient Name: Amado Nashatricia Macho DOB: 13-Jun-1945 MRN: [id number]  Date of Service  11/23/2013   HPI/Events of Note   Tube feed in place.  eICU Interventions  Will order tube feeds with nutritionist to adjust.   Intervention Category Major Interventions: Other:  Ethelbert Thain 11/23/2013, 5:38 PM

## 2013-11-23 NOTE — Progress Notes (Signed)
eLink Physician-Brief Progress Note Patient Name: Amado Nashatricia Kot DOB: 02-Apr-1946 MRN: [id number]  Date of Service  11/23/2013   HPI/Events of Note   Low k crt up  eICU Interventions  k supp IV, conservative   Intervention Category Intermediate Interventions: Electrolyte abnormality - evaluation and management  Nelda BucksFEINSTEIN,Evelyna Folker J. 11/23/2013, 4:28 AM

## 2013-11-23 NOTE — Procedures (Signed)
Objective Swallowing Evaluation: Modified Barium Swallowing Study  Patient Details  Name: Allison Washington MRN: [id number] Date of Birth: [birth year]  Today's Date: 11/23/2013 Time: 4132-4401 SLP Time Calculation (min): 30 min  Past Medical History:  Past Medical History  Diagnosis Date  . 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 Diabetes mellitus     Prone to hypoglycemia  . Aphagia     Status post numerous  . Dysphagia   . Peripheral vascular disease, unspecified   . Arterial disease   . Depressive disorder   . Hypothyroid   . Seizures   . MRSA infection     History of MRSA in the left leg wound and sacrum in the past-has been seen by vascular surgery and declined any type of intervention including arteriogram  . Diverticulosis     GI bleed in 2007-s/p endoscopy and colonoscopy in 2007 s/p massive GIB consittent with Diveerticulosis  . DVT (deep venous thrombosis)     Not on Coumadin  . Anemia     Chronic  . Non-smoker   . Stroke   . Hypertension   . History of DVT (deep vein thrombosis) 03/06/2011  . Nursing home acquired MRSA infection 03/06/2011    History of MRSA in the left leg wound and sacrum in the past-has been seen by vascular surgery and declined any type of intervention including arteriogram   . SEIZURE DISORDER 12/17/2006    Qualifier: Diagnosis of  By: Clent Ridges MD, Tera Mater    Past Surgical History:  Past Surgical History  Procedure Laterality Date  . Above knee leg amputation    . Femoral-popliteal bypass graft     HPI:  68 yo female adm to [facility] with AMS, diagnosed with sepsis.  Pt has h/o CVA with expressive aphasia/motor planning issues, dysphagia, dysarthria and right hemiparesis.  CXR negative - chronic lung changes.  Pt for MRI, CT head and repeat CXR today.  She resides in SNF but premorbid diet not listed in med rec form.   Swallow evaluation ordered.      Assessment / Plan / Recommendation Clinical Impression  Dysphagia Diagnosis: Severe oral phase dysphagia;Mild pharyngeal phase dysphagia;Moderate pharyngeal phase dysphagia Clinical impression: Pt.'s swallow ability is unsafe and non functional at present time.  She exhibits a severe oral and mild-moderate pharyngeal dysphagia.  Oral deficits marked by motor impairments including lingual pumping, decreased lingual palatal strength, decreased lingual manipulation, bilateral bolus loss and lingual protrusion during swallow.  Impairments led to significant delays in anterior-posterior oral cavity transit upward of 3-4 minutes.  Decreased sensation led to swallow initiation at the pyriform sinuses and aspiration of nectar and honey consistencies with delayed cough. Family arrived in room as SLP documenting report and confirmed pt. was eating/drinking regular texture and thin liquids with occassional coughing, however no significant oral delays as observed today.  Discussed with Dr. Kendrick Fries and plan is for PANDA with continued ST which family is in agreement with.  Prognosis appears good for return to baseline status.  Daughter reports pt. unable to phonate yesterday and today she is able to verbalize with a hoarse quality.      Treatment Recommendation  Therapy as outlined in treatment plan below    Diet Recommendation NPO   Medication Administration: Via alternative means    Other  Recommendations Oral Care Recommendations:  (oral care per protocol)  Follow Up Recommendations  Skilled Nursing facility    Frequency and Duration min 2x/week  2 weeks   Pertinent Vitals/Pain WDL            Reason for Referral Objectively evaluate swallowing function   Oral Phase Oral Preparation/Oral Phase Oral Phase: Impaired Oral - Honey Oral - Honey Teaspoon: Delayed oral transit;Lingual pumping;Weak lingual manipulation;Left anterior bolus loss;Right anterior bolus  loss;Incomplete tongue to palate contact;Reduced posterior propulsion;Lingual/palatal residue Oral - Honey Cup: Delayed oral transit;Lingual pumping;Weak lingual manipulation;Left anterior bolus loss;Right anterior bolus loss;Incomplete tongue to palate contact;Reduced posterior propulsion;Lingual/palatal residue Oral - Nectar Oral - Nectar Cup: Delayed oral transit;Lingual pumping;Weak lingual manipulation;Left anterior bolus loss;Right anterior bolus loss;Incomplete tongue to palate contact;Reduced posterior propulsion;Lingual/palatal residue Oral - Solids Oral - Puree: Delayed oral transit;Lingual pumping;Weak lingual manipulation;Left anterior bolus loss;Right anterior bolus loss;Incomplete tongue to palate contact;Reduced posterior propulsion;Lingual/palatal residue (> 4 minute oral transit)   Pharyngeal Phase Pharyngeal Phase Pharyngeal Phase: Impaired Pharyngeal - Honey Pharyngeal - Honey Teaspoon: Delayed swallow initiation;Premature spillage to pyriform sinuses;Pharyngeal residue - valleculae;Reduced tongue base retraction (trace) Pharyngeal - Honey Cup: Delayed swallow initiation;Premature spillage to pyriform sinuses;Penetration/Aspiration during swallow;Reduced airway/laryngeal closure Penetration/Aspiration details (honey cup): Material enters airway, passes BELOW cords without attempt by patient to eject out (silent aspiration) (delayed cough) Pharyngeal - Nectar Pharyngeal - Nectar Cup: Penetration/Aspiration during swallow;Delayed swallow initiation;Reduced airway/laryngeal closure;Premature spillage to pyriform sinuses;Moderate aspiration Penetration/Aspiration details (nectar cup): Material enters airway, passes BELOW cords without attempt by patient to eject out (silent aspiration) (delayed cough) Pharyngeal - Solids Pharyngeal - Puree: Pharyngeal residue - valleculae;Reduced tongue base retraction;Delayed swallow initiation;Premature spillage to pyriform sinuses (trace residue)   Cervical Esophageal Phase    GO    Cervical Esophageal Phase Cervical Esophageal Phase: Leonarda SalonWFL         Darrow BussingLisa Willis Tsugio Elison M.Ed [employer]-SLP Pager [phone]  11/23/2013

## 2013-11-23 NOTE — Progress Notes (Signed)
Patient Allison Washington      DOB: May 31, 1945      MDY:[id number]   Palliative Medicine Team at [facility] Progress Note    Subjective: Had PANDA tube placed this afternoon. Was a little sleepy afterwards.  Nursing reports that she has been doing well today.  Daughter Allison Washington at bedside states Allison Washington has been trying to vocalize some words bun unable.  Unable to obtain ROS after PANDA placed today as patient became agitated.      Filed Vitals:   11/23/13 1900  BP: 144/37  Pulse: 81  Temp:   Resp: 19   Physical exam: GEN: Drowsy, more agitated when I arouse her HEENT: [place], sclera anicteric CV: RRR LUNGS: CTAB,. symm expansion ABD: soft, ND ExT: R AKA    Assessment and plan: 68 yo female with PMHx of CVA w/aphasia, DM, HTN, R AKA, seizures who was found unresponsive at nursing home. Transfer here revealed likely sepsis, concern for opioid induced oversedation, AKI on CKD.   1. Code Status: Full Code   2. Goals of Care: See initial consultation. Met with daughter Allison Washington today. She had a lot of frustration over possible narcotic overdose leading to events.  Felt that 2 fentanyl patches were placed perhaps accidentally (saw this in ED?) and also was concerned that she was on pain patch. They have some frustration with nursing home care and feel like they will have to provide better over site of her care there.  Overall, they hope [name] continues to make progress and can come off PANDA tube with return to nursing home care.  Long term goals difficult to discuss given their acute concerns for iatrogenic narcotic overdose.   3. Symptom Management:  Encephalopathy- IOff narcan drip and doing well through day.  Drowsy after PANDA placement. Will monitor.  Chronic Pain- see initial consult. Off narcotics.  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 May be able to keep off opioids. Will monitor.  Dysphagia- PANDA tube placed. Speech feels she has good rehab potential.    4.  Psychosocial/Spiritual: Has brother in [place]. Son Allison Washington and daughter Allison Washington live locally.   Time in 435 Time Out 450 Total tme 15 minutes  >50% of time spent in counseling and coordination of care as above  Doran Clay D.O. Palliative Medicine Team at [facility]  Pager: [phone] Team Phone: [phone]

## 2013-11-23 NOTE — Progress Notes (Signed)
Asked by pt.'s nurse to NTS. Audible rhonchi was heard before suctioning. RT suctioned moderate amount of thick white,tan secretions. PT tolerated procedure well SATS and HR stable throughout. Pt vomited small amount of bile when RT was finished, pt was rolled onto side to prevent aspiration. Nurse was present to help RT with procedure. Pt is now resting comfortably.

## 2013-11-23 NOTE — Care Management Note (Addendum)
    Page 1 of 1   12/03/2013     9:42:58 AM CARE MANAGEMENT NOTE 12/03/2013  Patient:  Allison Washington,Allison Washington   Account Number:  [id number]  Date Initiated:  11/23/2013  Documentation initiated by:  Junius CreamerWELL,DEBBIE  Subjective/Objective Assessment:   adm encepalopathy     Action/Plan:   from nsg facility   Anticipated DC Date:     Anticipated DC Plan:  SKILLED NURSING FACILITY  In-house referral  Clinical Social Worker         Choice offered to / List presented to:             Status of service:  Completed, signed off Medicare Important Message given?  YES (If response is "NO", the following Medicare IM given date fields will be blank) Date Medicare IM given:  11/30/2013 Medicare IM given by:  Samarie Pinder Date Additional Medicare IM given:  11/25/2013 Additional Medicare IM given by:  [facility]  Discharge Disposition:  SKILLED NURSING FACILITY  Per UR Regulation:  Reviewed for med. necessity/level of care/duration of stay  If discussed at Long Length of Stay Meetings, dates discussed:   11/26/2013  12/01/2013    Comments:  12/01/13 1210 Allison Marquard RN MSN BSN CCM Per RN, [facility]  TC to chaplain's office to request completion of documentation.  11/30/13 1542 Allison Cristo RN MSN BSN CCM Requested LTAC evals.

## 2013-11-23 NOTE — Progress Notes (Signed)
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 Patient was transferred to [facility] on 7/25 for narcan drip requirement.   SIGNIFICANT EVENTS / STUDIES:  7/24 Adm to [facility] with AMS, concern for sepsis 7/24 CT head: Chronic involutional change and chronic lacunar infarcts, stable from prior study. No acute findings 7/24 EEG: Moderate generalized continuous nonspecific slowing of cerebral activity. No epileptiform activity 7/25 CTAbd: no acute abn's 7/25 MRI brain: No evidence of acute intracranial abnormality. Advanced chronic small vessel ischemic disease and remote lacunar infarcts. 7/25Cardiac arrest, ROSC obtained with bagging alone. transferred to ICU/PCCM service. Cardiac markers positive (trop I 4.52) 7/26 DC naloxone gtt. Severe hypertension 7/26 TTE:  7/26 Palliative Care consultation > remains full code  LINES / TUBES:   CULTURES: Blood cultures x 2 7/24, NGTD Urine culture 7/24 NGTD Resp Viral Panel (ordered 7/25) Sputum Culture (7/25)  ANTIBIOTICS: Zosyn 7/24 - Vanc 7/24 -   SUBJECTIVE:  Febrile overnight, required NTS suctioning yesterday   VITAL SIGNS: Temp:  [99 F (37.2 C)-101.2 F (38.4 C)] 99 F (37.2 C) (07/27 0400) Pulse Rate:  [86-106] 91 (07/27 0600) Resp:  [17-31] 24 (07/27 0600) BP: (127-234)/(27-84) 127/34 mmHg (07/27 0600) SpO2:  [93 %-98 %] 93 % (07/27 0600) HEMODYNAMICS:   VENTILATOR SETTINGS:   INTAKE / OUTPUT: Intake/Output     07/26 0701 - 07/27 0700 07/27 0701 - 07/28 0700   I.V. (mL/kg) 1251.2 (16.5)    IV Piggyback 362.5    Total Intake(mL/kg) 1613.7 (21.3)    Urine (mL/kg/hr) 1620 (0.9)    Total Output 1620     Net -6.3            PHYSICAL EXAMINATION: Gen: chronically ill appearing HEENT: NCAT, PERRL PULM: rhonchi bilateral  CV: RRR, no mgr AB: bS+, soft Ext: s/p R AKA, no swelling left Neuro: awake, alert, follows commands, interactive   LABS:  CBC  Recent Labs Lab 11/21/13 2055 11/22/13 0230 11/23/13 0244  WBC 14.1* 10.6* 11.2*  HGB 12.1 11.0* 10.3*  HCT 39.8 35.4* 32.9*  PLT 331 278 257   Coag's  Recent Labs Lab 11/21/13 2055  APTT 34  INR 1.18   BMET  Recent Labs Lab 11/21/13 2055 11/22/13 0230 11/23/13 0244  NA 148* 147 147  K 4.2 3.6* 3.1*  CL 106 103 106  CO2 17* 23 23  BUN 41* 46* 54*  CREATININE 2.34* 2.34* 2.26*  GLUCOSE 209* 232* 227*   Electrolytes  Recent Labs Lab 11/20/13 2238  11/21/13 2055 11/22/13 0230 11/23/13 0244  CALCIUM  --   < > 9.3 8.9 8.8  MG 2.3  --  2.3  --   --   PHOS  --   --  5.4*  --   --   < > = values in this interval not displayed. Sepsis Markers  Recent Labs Lab 11/21/13 2053 11/22/13 0230 11/22/13 0810  LATICACIDVEN 4.0* 1.3 1.1   ABG  Recent Labs Lab 11/20/13 2304 11/21/13 0356 11/21/13 2005  PHART 7.291* 7.297* 7.375  PCO2ART 48.6* 52.0* 37.0  PO2ART 56.8* 69.7* 62.0*   Liver Enzymes  Recent Labs Lab 11/21/13 2055 11/22/13 0230 11/23/13 0244  AST 69* 56*  40*  ALT 28 25 21   ALKPHOS 113 97 75  BILITOT 0.3 <0.2* <0.2*  ALBUMIN 3.1* 2.8* 2.5*   Cardiac Enzymes  Recent Labs Lab 11/20/13 1705  11/22/13 0230 11/22/13 0810 11/23/13 0244  TROPONINI  --   < > 3.77* 4.52* 2.84*  PROBNP 832.1*  --   --   --   --   < > = values in this interval not displayed. Glucose  Recent Labs Lab 11/22/13 0811 11/22/13 1241 11/22/13 1632 11/22/13 1956 11/23/13 0014 11/23/13 0411  GLUCAP 224* 276* 290* 255* 196* 261*    Imaging   7/25 EKG:  Non-specific ST wave changes 7/26 CXR: LLL infiltrate  ASSESSMENT / PLAN:  PULMONARY A: Acute hypoxic respiratory failure due to LLL HCAP vs  aspiration P:   Cotn supp O2 to maintain SpO2 > 92% Aspiration precautions NTS prn SLP consult for swallow eval today  CARDIOVASCULAR A: NSTEMI Cardiac Arrest, etiology unclear - likely due to resp depression Acute on Chronic Diastolic HF Severe hypertension  P:   Cards following Heparin per cardiology Scheduled hydralazine, metoprolol ordered Continue clonidine patch to TTS 3 Cont PRN hydralazine and labetalol Poor candidate for invasive cardiac eval  RENAL A: AKI, non-oliguric CKD Hypokalemia P:   Monitor BMET intermittently Monitor I/Os Correct electrolytes as indicated Poor candidate for long term HD  GASTROINTESTINAL A: NASH Dysphagia> PEG would not change aspiration risk P:   NPO SLP eval   HEMATOLOGIC A: AOCD History of DVT > poor candidate for long term anticoagulation due to severe medical comorbidities P:   DVT px: heparin gtt for ACS (OK to stop from my perspective) Monitor CBC intermittently Transfuse per usual ICU guidelines  INFECTIOUS A: LLL HCAP vs aspiration pneumonia P:   Micro and abx as above No indication for droplet precautions  ENDOCRINE A: DM 2 Hypothroidism P:   Cont SSI Increase lantus to 25 U daily Cont L thyroxine  NEUROLOGIC A: Encephalopathy - suspect due to excessive opioids, improving after removal of fentanyl patches Seizure DO  Persistent exp aphasia Severe baseline debilitation P:   Minimize sedating meds F/u palliative care recs   TODAY'S SUMMARY: mental status improved, increase lantus, SLP eval, [facility]  PCCM off on 7/28  I have personally obtained a history, examined the patient, evaluated laboratory and imaging results, formulated the assessment and plan and placed orders.  CRITICAL CARE: The patient is critically ill with multiple organ systems failure and requires high complexity decision making for assessment and support, frequent evaluation and titration of  therapies, application of advanced monitoring technologies and extensive interpretation of multiple databases. Critical Care Time devoted to patient care services described in this note is 35 minutes.   Heber CarolinaBrent McQuaid, MD [place] [facility]M Pager: [phone] Cell: [phone] If no response, call [phone]   11/23/2013, 7:06 AM

## 2013-11-23 NOTE — Clinical Social Work Psychosocial (Signed)
Clinical Social Work Department BRIEF PSYCHOSOCIAL ASSESSMENT 11/23/2013  Patient:  Allison Washington,Allison Washington     Account Number:  [id number]     Admit date:  11/20/2013  Clinical Social Worker:  Read DriversINGLE,Veneda Kirksey, LCSWA  Date/Time:  11/23/2013 02:21 PM  Referred by:  Physician  Date Referred:  11/23/2013 Referred for  SNF Placement   Other Referral:   none   Interview type:  Other - See comment Other interview type:   daughter, Allison Washington [phone]    PSYCHOSOCIAL DATA Living Status:  FACILITY [facility] Level of care:  Skilled Nursing Facility Primary support name:  Allison BradfordKimberly Primary support relationship to patient:  CHILD, ADULT Degree of support available:   adequate    CURRENT CONCERNS Current Concerns  Post-Acute Placement   Other Concerns:   none    SOCIAL WORK ASSESSMENT / PLAN Pt is non-verbal and unable to participate in assessment. CSW spoke with Allison Washington, pt daughter, who reports that pt is from [facility] where the pt has been a resident for approximately 2+ years.  Pt has been in LTC for approximately 10 years overall.  Allison BradfordKimberly states that she is satisfied with the level of care and [facility] that [facility].  It is Kimberly's expectation that pt will return to [facility] once pt has been medically dc'd from the hospital.    Allison BradfordKimberly inquired if [facility] pt. CSW directed this question to [facility].  Allison BradfordKimberly agreeable to contact facility.    Allison BradfordKimberly states that she is grateful for the assistance and insurance coverage for her mother while she is needing LTC. Allison BradfordKimberly is realistic regarding pt's prognosis and quality of life.   Assessment/plan status:  Psychosocial Support/Ongoing Assessment of Needs Other assessment/ plan:   FL2-updated  PASARR- confirm existing   Information/referral to community resources:   [facility]- [facility]    PATIENT'S/FAMILY'S  RESPONSE TO PLAN OF CARE: Pt daughter, Allison Washington, agreeable to plan of care for pt to [facility] once medically stable and ready for dc.       Vickii PennaGina Persephanie Laatsch, LCSWA [phone]  Clinical Social Work

## 2013-11-23 NOTE — Progress Notes (Signed)
ANTICOAGULATION CONSULT NOTE - Follow Up Consult  Pharmacy Consult for Heparin > d/c Indication: chest pain/ACS  No Known Allergies  Patient Measurements: Height: 5\' 3"  (160 cm) Weight: 167 lb 5.3 oz (75.9 kg) IBW/kg (Calculated) : 52.4 Heparin Dosing Weight: 70 kg  Vital Signs: Temp: 99.5 F (37.5 C) (07/27 0700) Temp src: Oral (07/27 0700) BP: 155/43 mmHg (07/27 0900) Pulse Rate: 81 (07/27 0900)  Labs:  Recent Labs  11/21/13 2055 11/22/13 0230 11/22/13 0810 11/22/13 0941 11/22/13 1925 11/23/13 0244  HGB 12.1 11.0*  --   --   --  10.3*  HCT 39.8 35.4*  --   --   --  32.9*  PLT 331 278  --   --   --  257  APTT 34  --   --   --   --   --   LABPROT 15.0  --   --   --   --   --   INR 1.18  --   --   --   --   --   HEPARINUNFRC  --   --   --  0.94* 0.65 0.67  CREATININE 2.34* 2.34*  --   --   --  2.26*  TROPONINI  --  3.77* 4.52*  --   --  2.84*    Estimated Creatinine Clearance: 24.2 ml/min (by C-G formula based on Cr of 2.26).   Medications:  Infusions:  . sodium chloride 10 mL/hr at 11/22/13 0307  . dextrose 5 % and 0.45 % NaCl with KCl 10 mEq/L 50 mL/hr at 11/23/13 1033    Assessment: 68 year old female with altered mental status being treated for pneumonia and possible narcotic-related encephalopathy with Narcan, now found to have NSTEMI with elevated troponin.  Heparin level therapeutic this AM.  Heparin then d/c'd by Dr. Shirlee LatchMcLean.  Goal of Therapy:  Heparin level 0.3-0.7 units/ml Monitor platelets by anticoagulation protocol: Yes   Plan:  -D/C heparin.  Tad MooreJessica Sohail Capraro, Pharm D, BCPS  Clinical Pharmacist Pager [phone]  11/23/2013 11:18 AM

## 2013-11-23 NOTE — Progress Notes (Signed)
Speech Language Pathology Treatment: Dysphagia  Patient Details Name: Allison Washington MRN: [id number] DOB: [birth year] Today's Date: 11/23/2013 Time: [id number] SLP Time Calculation (min): 18 min  Assessment / Plan / Recommendation Clinical Impression  Determination of swallow status this morning with pt. Easily aroused.  Audible secretions which she mobilized in oral cavity after verbal cues and stimulation of gag reflex using [place].  Delayed oral transit, suspected delayed swallow initiation and probable aspiration with water with immediate cough.  MBS recommended to determine current swallow function and recommendation of po's if safe and appropriate.  MBS scheduled today at 1300.   HPI HPI: 68 [facility], diagnosed with sepsis.  Pt has h/o CVA with expressive aphasia/motor planning issues, dysphagia, dysarthria and right hemiparesis.  CXR negative - chronic lung changes.  Pt for MRI, CT head and repeat CXR today.  She resides in SNF but premorbid diet not listed in med rec form.  Swallow evaluation ordered.    Pertinent Vitals WDL  SLP Plan  MBS    Recommendations Diet recommendations: NPO              Oral Care Recommendations: Oral care BID Follow up Recommendations: Skilled Nursing facility Plan: MBS    GO     Breck CoonsLisa Willis FarnhamvilleLitaker M.Ed [employer]-SLP Pager [phone]  11/23/2013

## 2013-11-24 DIAGNOSIS — J690 Pneumonitis due to inhalation of food and vomit: Secondary | ICD-10-CM

## 2013-11-24 DIAGNOSIS — I10 Essential (primary) hypertension: Secondary | ICD-10-CM

## 2013-11-24 LAB — GLUCOSE, CAPILLARY
GLUCOSE-CAPILLARY: 214 mg/dL — AB (ref 70–99)
GLUCOSE-CAPILLARY: 196 mg/dL — AB (ref 70–99)
Glucose-Capillary: 197 mg/dL — ABNORMAL HIGH (ref 70–99)
Glucose-Capillary: 207 mg/dL — ABNORMAL HIGH (ref 70–99)
Glucose-Capillary: 232 mg/dL — ABNORMAL HIGH (ref 70–99)
Glucose-Capillary: 232 mg/dL — ABNORMAL HIGH (ref 70–99)

## 2013-11-24 LAB — CBC WITH DIFFERENTIAL/PLATELET
BASOS ABS: 0 10*3/uL (ref 0.0–0.1)
BASOS PCT: 0 % (ref 0–1)
EOS ABS: 0 10*3/uL (ref 0.0–0.7)
Eosinophils Relative: 0 % (ref 0–5)
HCT: 31.9 % — ABNORMAL LOW (ref 36.0–46.0)
Hemoglobin: 9.9 g/dL — ABNORMAL LOW (ref 12.0–15.0)
Lymphocytes Relative: 19 % (ref 12–46)
Lymphs Abs: 2 10*3/uL (ref 0.7–4.0)
MCH: 27.7 pg (ref 26.0–34.0)
MCHC: 31 g/dL (ref 30.0–36.0)
MCV: 89.1 fL (ref 78.0–100.0)
Monocytes Absolute: 1.2 10*3/uL — ABNORMAL HIGH (ref 0.1–1.0)
Monocytes Relative: 11 % (ref 3–12)
NEUTROS ABS: 7.2 10*3/uL (ref 1.7–7.7)
Neutrophils Relative %: 70 % (ref 43–77)
Platelets: 236 10*3/uL (ref 150–400)
RBC: 3.58 MIL/uL — ABNORMAL LOW (ref 3.87–5.11)
RDW: 15.3 % (ref 11.5–15.5)
WBC: 10.4 10*3/uL (ref 4.0–10.5)

## 2013-11-24 LAB — COMPREHENSIVE METABOLIC PANEL
ALK PHOS: 68 U/L (ref 39–117)
ALT: 20 U/L (ref 0–35)
AST: 30 U/L (ref 0–37)
Albumin: 2.6 g/dL — ABNORMAL LOW (ref 3.5–5.2)
Anion gap: 13 (ref 5–15)
BUN: 55 mg/dL — AB (ref 6–23)
CHLORIDE: 112 meq/L (ref 96–112)
CO2: 23 meq/L (ref 19–32)
Calcium: 8.8 mg/dL (ref 8.4–10.5)
Creatinine, Ser: 2.31 mg/dL — ABNORMAL HIGH (ref 0.50–1.10)
GFR, EST AFRICAN AMERICAN: 24 mL/min — AB (ref >90)
GFR, EST NON AFRICAN AMERICAN: 21 mL/min — AB (ref >90)
Glucose, Bld: 254 mg/dL — ABNORMAL HIGH (ref 70–99)
POTASSIUM: 3.3 meq/L — AB (ref 3.7–5.3)
Sodium: 148 mEq/L — ABNORMAL HIGH (ref 137–147)
Total Protein: 6.8 g/dL (ref 6.0–8.3)

## 2013-11-24 MED ORDER — POTASSIUM CHLORIDE 10 MEQ/100ML IV SOLN
10.0000 meq | INTRAVENOUS | Status: AC
  Administered 2013-11-24 (×4): 10 meq via INTRAVENOUS
  Filled 2013-11-24 (×4): qty 100

## 2013-11-24 MED ORDER — CLONIDINE HCL 0.2 MG/24HR TD PTWK
0.4000 mg | MEDICATED_PATCH | TRANSDERMAL | Status: DC
  Administered 2013-11-24: 0.4 mg via TRANSDERMAL
  Filled 2013-11-24 (×2): qty 2

## 2013-11-24 MED ORDER — HYDRALAZINE HCL 20 MG/ML IJ SOLN
15.0000 mg | Freq: Four times a day (QID) | INTRAMUSCULAR | Status: DC
  Administered 2013-11-24: 15 mg via INTRAVENOUS

## 2013-11-24 MED ORDER — METOPROLOL TARTRATE 1 MG/ML IV SOLN
10.0000 mg | INTRAVENOUS | Status: DC
  Administered 2013-11-24 – 2013-11-25 (×6): 10 mg via INTRAVENOUS
  Filled 2013-11-24 (×9): qty 10

## 2013-11-24 MED ORDER — POTASSIUM CHLORIDE 20 MEQ/15ML (10%) PO LIQD: 40.0000 meq | Freq: Once | ORAL | Status: DC

## 2013-11-24 MED ORDER — HYDRALAZINE HCL 20 MG/ML IJ SOLN
20.0000 mg | Freq: Four times a day (QID) | INTRAMUSCULAR | Status: DC
Start: 2013-11-24 — End: 2013-11-25
  Administered 2013-11-24 – 2013-11-25 (×5): 20 mg via INTRAVENOUS
  Filled 2013-11-24 (×7): qty 1

## 2013-11-24 NOTE — Progress Notes (Signed)
ANTIBIOTIC CONSULT NOTE - Follow-up  Pharmacy Consult for Vancomycin, Zosyn Indication: Sepsis   No Known Allergies  Patient Measurements: Height: 5\' 3"  (160 cm) Weight: 169 lb 5 oz (76.8 kg) IBW/kg (Calculated) : 52.4 Adjusted Body Weight: n/a   Vital Signs: Temp: 98.9 F (37.2 C) (07/28 1117) Temp src: Oral (07/28 1117) BP: 210/59 mmHg (07/28 1117) Pulse Rate: 93 (07/28 1117) Intake/Output from previous day: 07/27 0701 - 07/28 0700 In: 1765.2 [I.V.:1233.2; NG/GT:282; IV Piggyback:250] Out: 100 [Urine:100] Intake/Output from this shift: Total I/O In: 250 [I.V.:150; IV Piggyback:100] Out: -   Labs:  Recent Labs  11/22/13 0230 11/23/13 0244 11/24/13 0426  WBC 10.6* 11.2* 10.4  HGB 11.0* 10.3* 9.9*  PLT 278 257 236  CREATININE 2.34* 2.26* 2.31*   Estimated Creatinine Clearance: 23.8 ml/min (by C-G formula based on Cr of 2.31). No results found for this basename: VANCOTROUGH, VANCOPEAK, VANCORANDOM, [place], GENTPEAK, GENTRANDOM, TOBRATROUGH, TOBRAPEAK, TOBRARND, AMIKACINPEAK, AMIKACINTROU, AMIKACIN,  in the last 72 hours   Microbiology: Recent Results (from the past 720 hour(s))  CULTURE, BLOOD (ROUTINE X 2)     Status: None   Collection Time    11/20/13  6:50 PM      Result Value Ref Range Status   Specimen Description BLOOD RIGHT ARM   Final   Special Requests BOTTLES DRAWN AEROBIC ONLY 10CC   Final   Culture  Setup Time     Final   Value: 11/21/2013 00:21     Performed at [facility]   Culture     Final   Value:        BLOOD CULTURE RECEIVED NO GROWTH TO DATE CULTURE WILL BE HELD FOR 5 DAYS BEFORE ISSUING A FINAL NEGATIVE REPORT     Performed at [facility]   Report Status PENDING   Incomplete  CULTURE, BLOOD (ROUTINE X 2)     Status: None   Collection Time    11/20/13  6:55 PM      Result Value Ref Range Status   Specimen Description BLOOD RIGHT HAND   Final   Special Requests BOTTLES DRAWN AEROBIC ONLY 10CC   Final   Culture   Setup Time     Final   Value: 11/21/2013 00:21     Performed at [facility]   Culture     Final   Value:        BLOOD CULTURE RECEIVED NO GROWTH TO DATE CULTURE WILL BE HELD FOR 5 DAYS BEFORE ISSUING A FINAL NEGATIVE REPORT     Performed at [facility]   Report Status PENDING   Incomplete  URINE CULTURE     Status: None   Collection Time    11/20/13  8:18 PM      Result Value Ref Range Status   Specimen Description URINE, CATHETERIZED   Final   Special Requests NONE   Final   Culture  Setup Time     Final   Value: 11/21/2013 01:18     Performed at [facility]     Final   Value: NO GROWTH     Performed at [facility]   Culture     Final   Value: NO GROWTH     Performed at [facility]   Report Status 11/22/2013 FINAL   Final  MRSA PCR SCREENING     Status: None   Collection Time    11/20/13  9:52 PM  Result Value Ref Range Status   MRSA by PCR NEGATIVE  NEGATIVE Final   Comment:            The GeneXpert MRSA Assay (FDA     approved for NASAL specimens     only), is one component of a     comprehensive MRSA colonization     surveillance program. It is not     intended to diagnose MRSA     infection nor to guide or     monitor treatment for     MRSA infections.  RESPIRATORY VIRUS PANEL     Status: None   Collection Time    11/22/13  7:58 PM      Result Value Ref Range Status   Source - RVPAN NASAL SWAB   Corrected   Comment: CORRECTED ON 07/27 AT 2040: PREVIOUSLY REPORTED AS NASAL SWAB   Respiratory Syncytial Virus A NOT DETECTED   Final   Respiratory Syncytial Virus B NOT DETECTED   Final   Influenza A NOT DETECTED   Final   Influenza B NOT DETECTED   Final   Parainfluenza 1 NOT DETECTED   Final   Parainfluenza 2 NOT DETECTED   Final   Parainfluenza 3 NOT DETECTED   Final   Metapneumovirus NOT DETECTED   Final   Rhinovirus NOT DETECTED   Final   Adenovirus NOT DETECTED   Final   Influenza A H1 NOT  DETECTED   Final   Influenza A H3 NOT DETECTED   Final   Comment: (NOTE)           Normal Reference Range for each Analyte: NOT DETECTED     Testing performed using the Luminex xTAG Respiratory Viral Panel test     kit.     The analytical performance characteristics of this assay have been     determined by [facility].  The modifications have not been     cleared or approved by the FDA. This assay has been validated pursuant     to the CLIA regulations and is used for clinical purposes.     Performed at [facility] History: Past Medical History  Diagnosis Date  . 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 Diabetes mellitus     Prone to hypoglycemia  . Aphagia     Status post numerous  . Dysphagia   . Peripheral vascular disease, unspecified   . Arterial disease   . Depressive disorder   . Hypothyroid   . Seizures   . MRSA infection     History of MRSA in the left leg wound and sacrum in the past-has been seen by vascular surgery and declined any type of intervention including arteriogram  . Diverticulosis     GI bleed in 2007-s/p endoscopy and colonoscopy in 2007 s/p massive GIB consittent with Diveerticulosis  . DVT (deep venous thrombosis)     Not on Coumadin  . Anemia     Chronic  . Non-smoker   . Stroke   . Hypertension   . History of DVT (deep vein thrombosis) 03/06/2011  . Nursing home acquired MRSA infection 03/06/2011    History of MRSA in the left leg wound and sacrum in the past-has been seen by vascular surgery and declined any type of intervention including arteriogram   .  SEIZURE DISORDER 12/17/2006    Qualifier: Diagnosis of  By: Sarajane Jews MD, Ishmael Holter     Medications:  Prescriptions prior to admission  Medication Sig Dispense Refill  . Amino Acids-Protein Hydrolys (FEEDING SUPPLEMENT, PRO-STAT SUGAR  FREE 64,) LIQD Take 30 mLs by mouth every morning.      Marland Kitchen amLODipine (NORVASC) 10 MG tablet Take 10 mg by mouth every morning.      Marland Kitchen aspirin 81 MG tablet Take 324 mg by mouth every morning.       . cloNIDine (CATAPRES - DOSED IN MG/24 HR) 0.2 mg/24hr patch Place 1 patch onto the skin once a week.      . fentaNYL (DURAGESIC - DOSED MCG/HR) 50 MCG/HR Place 1 patch (50 mcg total) onto the skin every 3 (three) days.  10 patch  0  . fish oil-omega-3 fatty acids 1000 MG capsule Take 1 g by mouth 2 (two) times daily.       . furosemide (LASIX) 40 MG tablet Take 40 mg by mouth every morning.      Marland Kitchen gemfibrozil (LOPID) 600 MG tablet Take 600 mg by mouth 2 (two) times daily before a meal.      . insulin aspart (NOVOLOG FLEXPEN) 100 UNIT/ML FlexPen Inject 7-10 Units into the skin daily with supper. 10 units with breakfast and lunch, 7 units with supper, hold for CBG < 80      . insulin glargine (LANTUS) 100 UNIT/ML injection Inject 66 Units into the skin daily at 12 noon.       Marland Kitchen levothyroxine (SYNTHROID, LEVOTHROID) 88 MCG tablet Take 88 mcg by mouth daily. 4 pm      . linagliptin (TRADJENTA) 5 MG TABS tablet Take 5 mg by mouth every morning.       Marland Kitchen LORazepam (ATIVAN) 0.5 MG tablet Take 0.5 mg by mouth every 8 (eight) hours as needed for anxiety.      Marland Kitchen losartan (COZAAR) 25 MG tablet Take 25 mg by mouth 2 (two) times daily.      . metoprolol tartrate (LOPRESSOR) 25 MG tablet Take 25 mg by mouth 2 (two) times daily.      . mirabegron ER (MYRBETRIQ) 50 MG TB24 tablet Take 50 mg by mouth every morning.       Marland Kitchen oxyCODONE (OXY IR/ROXICODONE) 5 MG immediate release tablet Take two tablets by mouth every four hours as needed for pain  360 tablet  0  . polyethylene glycol (MIRALAX / GLYCOLAX) packet Take 17 g by mouth every morning. Mix in 8 ounces of water; for constipation      . pregabalin (LYRICA) 50 MG capsule Take 50 mg by mouth 3 (three) times daily.      . rosuvastatin (CRESTOR) 40 MG tablet Take 40 mg by  mouth at bedtime.       . sennosides-docusate sodium (SENOKOT-S) 8.6-50 MG tablet Take 2 tablets by mouth every morning.      . zolpidem (AMBIEN) 5 MG tablet Take 2.5 mg by mouth at bedtime as needed for sleep.      Marland Kitchen acetaminophen (TYLENOL) 325 MG tablet Take 650 mg by mouth every 6 (six) hours as needed for fever (pain).       . nitroGLYCERIN (NITROSTAT) 0.4 MG SL tablet Place 0.4 mg under the tongue every 5 (five) minutes x 3 doses as needed for chest pain. For chest pain       Assessment: 35 YOF who presented to the ED with altered mental status.  WBC elevated at 16.7, LA 2.31 on admission. CXR negative for infiltrates. Pt continues on vanc/zosyn for aspiration PNA. SCr 2.31. CrCl ~ 24 mL/min. Minimal UOP documented.  7/24 vanc >> 7/24 zosyn >>  7/24 urine>>neg 7/24 blood x2 >> NGTD 7/26 resp virus>>not detected  Goal of Therapy:  Vancomycin trough level 15-20 mcg/ml Resolution of infection   Plan:  -Continue Vancomycin 1 gm IV Q 48 hours  -Zosyn 3.375 gm IV Q 8 hours  -Consider abx de-escalation -Monitor CBC, renal fx, cultures and patient's clinical progress -Vancomycin trough at [place], PharmD Clinical Pharmacist - Resident Pager [phone] 7/28/20151:39 PM

## 2013-11-24 NOTE — Progress Notes (Signed)
Patient WI:OMBTDHRC Washington      DOB: [birth year]      BUL:[id number]   Palliative Medicine Team at [facility] Progress Note    Subjective: Allison pulled out PANDA tube lat night.  Did not let nurses place new tube in. Denies any pain, N/V. Wants to go back to her nursing home.  Spoke with Daughter Allison Washington today via phone as well.      Filed Vitals:   11/24/13 1117  BP: 210/59  Pulse: 93  Temp: 98.9 F (37.2 C)  Resp: 24   Physical exam: GEN: alert, pleasent HEENT: Allison Washington, sclera anicteric  CV: RRR  LUNGS: CTAB, symm expansion  ABD: soft, ND  ExT: R AKA   Assessment and plan: 68 yo female with PMHx of CVA w/aphasia, DM, HTN, R AKA, seizures who was found unresponsive at nursing home. Transfer here revealed likely sepsis, concern for opioid induced oversedation, AKI on CKD.  1. Code Status: Full Code   2. Goals of Care: See initial consultation. Met with daughter Allison Washington and her brother. Both expressed a lot of frustration over possible narcotic overdose leading to events. Felt that 2 fentanyl patches were placed perhaps accidentally (saw this in ED?) and also was concerned that she was on pain patch, not sure why. They have some frustration with nursing home care and feel like they will have to provide better over site of her care there.  Long term goals difficult to discuss given their acute concerns for iatrogenic narcotic overdose.  - Spoke with Allison Washington again via phone today.  Informed her that Allison Washington pulled out PANDA and would not let nurses replace.  I am not sure how much insight Allison Washington has into why this was being utilized. Speech therapy feels like she has good potential for recovery from dysphagia.  Allison Washington to talk with her mom tonight. If ongoing issues with swallowing and not willing to do temporary PANDA, will likely need to re-address goals with family. Certainly a difficult subject with code status already reveresed and family frustration with previous OOH DNR order that she had  at [facility].   3. Symptom Management:  Encephalopathy- Improving Chronic Pain- see initial consult. Off narcotics. 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 May be able to keep off opioids.  Dysphagia- PANDA pulled out by patient and refused new. Speech feels she has good rehab potential. Monitor closely.   4. Psychosocial/Spiritual: Has brother in [place]. Son Allison Washington and daughter Allison Washington live locally.   Today is my last day on service and one of my partners will continue to follow along intermittently in her care.    Allison Washington D.O. Palliative Medicine Team at [facility]  Pager: [phone] Team Phone: [phone]

## 2013-11-24 NOTE — Clinical Documentation Improvement (Signed)
(  1 of 2) Patient was admitted from nursing home after being found down. Admitted with likely sepsis and concern from opioid induced oversedation. Per progress notes: "DM2". If possible, please help determine greater specificity for the DM2 diagnosis.... Controlled OR Uncontrolled. CBG ranges during hospitalization: 80-303. HgA1C included below. Thank you!  Component Hemoglobin A1C Mean Plasma Glucose  Latest Ref Rng <5.7 % <117 mg/dL  1/61/09607/24/2015 9.5 (H) 454226 (H)   Possible Clinical Conditions?  - Controlled DM2 - Uncontrolled DM2 - Other Condition - No clinical significance    (2 of 2) Patient was admitted from nursing home after being found down. Admitted with likely sepsis and concern from opioid induced oversedation. Per progress notes conflicting documentation has been found in regards to level of Right Lower Leg Amputation. "R AKA" and "R BKA" are both documented in the progress notes . If possible, please help clarify the level of the Right Lower Leg Amputation. Thank you!    Thank You, Saul FordyceSalena A Avalie Oconnor ,RN Clinical Documentation Specialist:  [phone]  [facility] Information Management

## 2013-11-24 NOTE — Progress Notes (Signed)
eLink Physician-Brief Progress Note Patient Name: Allison Washington DOB: December 14, 1945 MRN: [id number]  Date of Service  11/24/2013   HPI/Events of Note   Low k   eICU Interventions  supp k    Intervention Category Intermediate Interventions: Electrolyte abnormality - evaluation and management  Allison Washington,DANIEL J. 11/24/2013, 5:34 AM

## 2013-11-24 NOTE — Progress Notes (Addendum)
INITIAL NUTRITION ASSESSMENT  DOCUMENTATION CODES Per approved criteria  -Obesity Unspecified   INTERVENTION:  If TF to be started, recommend Glucerna 1.2 formula -- initiate at 20 ml/hr and increase by 10 ml every 4 hours to goal rate of 60 ml/hr with Prostat liquid protein 30 ml daily via tube to provide 1828 kcals, 101 gm protein, 1159 ml of free water RD to follow for nutrition care plan  NUTRITION DIAGNOSIS: Inadequate oral intake related to non-functional swallow as evidenced by NPO status  Goal: Pt to meet >/= 90% of their estimated nutrition needs   Monitor:  PO diet advancement, TF initiation, weight, labs, I/O's  Reason for Assessment: Consult  68 y.o. female  Admitting Dx: Encephalopathy  ASSESSMENT: 68 y.o. year old female with multiple medical problems including HTN, CVA, chronic pain, IDDM, seizure d/o presenting with encephalopathy, sepsis. Family reports that pt has an overall fairly low functional level at baseline. Family is unaware of any recent falls, infections, episodes of nausea, vomiting, diarrhea, increased urinary frequency. Blood sugar was low on EMS arrival. Was given d50 with blood sugars increasing into 200s. There was reported multiple episodes of vomiting in transport to ER.   RD unable to obtain nutrition hx from patient.  Non-verbal.  Pt s/p MBSS 7/27 -- pt swallow ability unsafe and non-functional -- SLP recommending NPO status.  Palliative Care Team following.  No muscle or subcutaneous fat depletion noticed.  RD consulted for TF initiation & management.  Patient pulled Panda tube out this AM; refused replacement at this time.    RD discontinued Adult Tube Feeding Protocol orders.  Height: Ht Readings from Last 1 Encounters:  11/20/13 5\' 3"  (1.6 m)    Weight: Wt Readings from Last 1 Encounters:  11/24/13 169 lb 5 oz (76.8 kg)    Ideal Body Weight: 115 lb  % Ideal Body Weight: 146%  Wt Readings from Last 10 Encounters:  11/24/13  169 lb 5 oz (76.8 kg)  09/15/13 171 lb (77.565 kg)  07/07/13 165 lb (74.844 kg)  12/30/12 170 lb (77.111 kg)  12/02/12 173 lb (78.472 kg)  09/04/12 176 lb (79.833 kg)  08/05/12 172 lb 9.6 oz (78.291 kg)  02/12/12 167 lb 15.9 oz (76.2 kg)  03/07/11 177 lb 0.5 oz (80.3 kg)    Usual Body Weight: 171 lb  % Usual Body Weight: 99%  BMI:  Body mass index is 30 kg/(m^2).  Estimated Nutritional Needs: Kcal: 1700-1900 Protein: 90-100 gm Fluid: 1.7-1.9 L  Skin: Stage II pressure ulcer to sacrum  Diet Order: NPO  EDUCATION NEEDS: -No education needs identified at this time   Intake/Output Summary (Last 24 hours) at 11/24/13 1209 Last data filed at 11/24/13 1000  Gross per 24 hour  Intake   1732 ml  Output      0 ml  Net   1732 ml    Labs:   Recent Labs Lab 11/20/13 2238  11/21/13 2055 11/22/13 0230 11/23/13 0244 11/24/13 0426  NA  --   < > 148* 147 147 148*  K  --   < > 4.2 3.6* 3.1* 3.3*  CL  --   < > 106 103 106 112  CO2  --   < > 17* 23 23 23   BUN  --   < > 41* 46* 54* 55*  CREATININE  --   < > 2.34* 2.34* 2.26* 2.31*  CALCIUM  --   < > 9.3 8.9 8.8 8.8  MG 2.3  --  2.3  --   --   --   PHOS  --   --  5.4*  --   --   --   GLUCOSE  --   < > 209* 232* 227* 254*  < > = values in this interval not displayed.  CBG (last 3)   Recent Labs  11/24/13 0424 11/24/13 0722 11/24/13 1115  GLUCAP 207* 214* 232*    Scheduled Meds: . antiseptic oral rinse  15 mL Mouth Rinse BID  . aspirin  300 mg Rectal Daily  . cloNIDine  0.3 mg Transdermal Weekly  . heparin subcutaneous  5,000 Units Subcutaneous 3 times per day  . hydrALAZINE  10 mg Intravenous Q6H  . insulin aspart  0-9 Units Subcutaneous 6 times per day  . insulin glargine  25 Units Subcutaneous Daily  . metoprolol  5 mg Intravenous Q4H  . piperacillin-tazobactam (ZOSYN)  IV  3.375 g Intravenous Q8H  . potassium chloride  10 mEq Intravenous Q1 Hr x 4  . sodium chloride  3 mL Intravenous Q12H  . vancomycin   1,000 mg Intravenous Q48H    Continuous Infusions: . sodium chloride 10 mL/hr at 11/22/13 0307  . dextrose 5 % and 0.45 % NaCl with KCl 10 mEq/L 50 mL/hr at 11/23/13 1033  . feeding supplement (JEVITY 1.2 CAL) Stopped (11/24/13 0600)    Past Medical History  Diagnosis Date  . 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 Diabetes mellitus     Prone to hypoglycemia  . Aphagia     Status post numerous  . Dysphagia   . Peripheral vascular disease, unspecified   . Arterial disease   . Depressive disorder   . Hypothyroid   . Seizures   . MRSA infection     History of MRSA in the left leg wound and sacrum in the past-has been seen by vascular surgery and declined any type of intervention including arteriogram  . Diverticulosis     GI bleed in 2007-s/p endoscopy and colonoscopy in 2007 s/p massive GIB consittent with Diveerticulosis  . DVT (deep venous thrombosis)     Not on Coumadin  . Anemia     Chronic  . Non-smoker   . Stroke   . Hypertension   . History of DVT (deep vein thrombosis) 03/06/2011  . Nursing home acquired MRSA infection 03/06/2011    History of MRSA in the left leg wound and sacrum in the past-has been seen by vascular surgery and declined any type of intervention including arteriogram   . SEIZURE DISORDER 12/17/2006    Qualifier: Diagnosis of  By: Clent RidgesFry MD, Tera MaterStephen A     Past Surgical History  Procedure Laterality Date  . Above knee leg amputation    . Femoral-popliteal bypass graft      Maureen ChattersKatie Jamisen Hawes, RD, LDN Pager #: [phone] After-Hours Pager #: [phone]

## 2013-11-24 NOTE — Progress Notes (Signed)
Allison Washington TEAM 1 - Stepdown/ICU TEAM Progress Note  Allison Washington CLE:[id number] DOB: June 24, 1945 DOA: 11/20/2013 PCP: [name], MD  Admit HPI / Brief Narrative: 68 y.o. BF PMHx Depression, hemiplegia and hemiparesis, Hx CVA, HTN, CVA x5, chronic pain (AKA), IDDM, Seizure d/o. Presenting with encephalopathy, sepsis. Pt is a resident at local SNF. Per report, pt was unresponsive earlier today despite vigorous arousal. Family reports that pt has an overall fairly low functional level at baseline. Family is unaware of any recent falls, infections, episodes of nausea, vomiting, diarrhea, increased urinary frequency. Blood sugar was low on EMS arrival. Was given d50 with blood sugars increasing into 200s. There was reported multiple episodes of vomiting in transport to ER.  On arrival to ER, temp 97.6, HR 70s-100s, BP 944H-675F systolic, Satting >16% on non rebreather. WBC 16.7, Hgb 12.5, K 3.5, Cr 2.8 (baselin 1.9), Glu 182. Lactate 2.3. Trop WNL. UA pending. Head CT with no acute findings. CXR with chronic lung changes with superimposed edema without infiltrate. Pt with fentanyl patch in place on arrival. This was removed. Pt was given small dose of narcan with subsequent improvement in mentation, albeit transient. I have asked EDP to start pt on narcan gtt.      HPI/Subjective: 7/28 patient alert not her head yes and no to simple questions, requests water by miming drinking. Nodes no when questioned about CP/SOB    Assessment/Plan: NSTEMI: -Suffered a bradycardic arrest in the setting of respiratory distress. Her troponin is increased most likely related to demand ischemia superimposed on underlying coronary disease.  -Per [name] not a candidate for aggressive cardiac measures  -Continue medical therapy; aspirin.  -Increase metoprolol IV 10 mg q4 hrs  -Increase Hydralazine 20 mg q 6hr -Increase clonidine patch to 0.4 mg  -Will add statin if/when patient passes swallow  evaluation   Diastolic CHF -See NSTEMI -See HTN  Hypertension:  -Increase metoprolol IV 10 mg q4 hrs  -Increase Hydralazine 20 mg q 6hr -Increase clonidine patch to 0.4 mg  -If above changes do not control patient's BP contact on call physician to start nitroglycerin drip   Acute on chronic stage III renal failure: - Stable, follow renal function.  -Control BP  Metabolic Encephalopathy:  -Patient back to baseline able to nod yes and no to simple questions    Aspiration PNA: -Patient high-risk of aspiration, continue antibiotics for 7 day course  -Continue patient n.p.o.; patient failed swallow study on 7/28     Code Status: FULL Family Communication: no family present at time of exam Disposition Plan: Return to [facility]    Consultants: [name] (neurology) [name] (cardiology) [name] [facility] M.)   Procedure/Significant Events: 7/25 EEG; abnormal with moderate generalized continuous nonspecific slowing of cerebral activity. This pattern of slowing can be seen with a wide variety of encephalopathies, including metabolic and toxic encephalopathies, as well as degenerative CNS disorders. No evidence of an epileptic disorder  7/26 echocardiogram - Left ventricle: moderate LVH. LVEF= 65%-to 70%. - (grade 1 diastolic dysfunction).    Culture Blood cultures x 2 7/24, NGTD  Urine culture 7/24 NGTD  Resp Viral Panel (ordered 7/25)  Sputum Culture (7/25)   Antibiotics: Zosyn 7/24 -  Vanc 7/24 -   DVT prophylaxis: Heparin subcutaneous   Devices NA   LINES / TUBES:  7/24 20ga right forearm 7/27 22ga right hand     Continuous Infusions: . sodium chloride 10 mL/hr at 11/22/13 0307  . dextrose  5 % and 0.45 % NaCl with KCl 10 mEq/L 50 mL/hr at 11/23/13 1033  . feeding supplement (JEVITY 1.2 CAL) Stopped (11/24/13 0600)    Objective: VITAL SIGNS: Temp: 99.1 F (37.3 C) (07/28 0931) Temp src: Oral (07/28 0931) BP:  197/57 mmHg (07/28 0931) Pulse Rate: 81 (07/28 0931) SPO2; FIO2:   Intake/Output Summary (Last 24 hours) at 11/24/13 1037 Last data filed at 11/24/13 1000  Gross per 24 hour  Intake   1832 ml  Output      0 ml  Net   1832 ml     Exam: General: No acute respiratory distress Lungs: Clear to auscultation bilaterally without wheezes or crackles Cardiovascular: Regular rate and rhythm without murmur gallop or rub normal S1 and S2 Renalbalance today;        /overall;        Creatinine ;        Hourly output   Abdomen: Nontender, nondistended, soft, bowel sounds positive, no rebound, no ascites, no appreciable mass Extremities: No significant cyanosis, clubbing, or edema bilateral lower extremities  Data Reviewed: Basic Metabolic Panel:  Recent Labs Lab 11/20/13 2238 11/21/13 0257 11/21/13 2055 11/22/13 0230 11/23/13 0244 11/24/13 0426  NA  --  145 148* 147 147 148*  K  --  4.0 4.2 3.6* 3.1* 3.3*  CL  --  104 106 103 106 112  CO2  --  24 17* $Remo'23 23 23  'NWQgX$ GLUCOSE  --  110* 209* 232* 227* 254*  BUN  --  36* 41* 46* 54* 55*  CREATININE  --  2.69* 2.34* 2.34* 2.26* 2.31*  CALCIUM  --  8.8 9.3 8.9 8.8 8.8  MG 2.3  --  2.3  --   --   --   PHOS  --   --  5.4*  --   --   --    Liver Function Tests:  Recent Labs Lab 11/21/13 0257 11/21/13 2055 11/22/13 0230 11/23/13 0244 11/24/13 0426  AST 65* 69* 56* 40* 30  ALT $Re'26 28 25 21 20  'Nlm$ ALKPHOS 113 113 97 75 68  BILITOT <0.2* 0.3 <0.2* <0.2* <0.2*  PROT 7.6 8.3 7.5 6.7 6.8  ALBUMIN 2.9* 3.1* 2.8* 2.5* 2.6*   No results found for this basename: LIPASE, AMYLASE,  in the last 168 hours  Recent Labs Lab 11/21/13 1000  AMMONIA 40   CBC:  Recent Labs Lab 11/21/13 0257 11/21/13 2055 11/22/13 0230 11/23/13 0244 11/24/13 0426  WBC 11.6* 14.1* 10.6* 11.2* 10.4  NEUTROABS 9.1* 9.0* 8.1* 7.8* 7.2  HGB 11.5* 12.1 11.0* 10.3* 9.9*  HCT 37.2 39.8 35.4* 32.9* 31.9*  MCV 88.8 89.6 87.6 87.3 89.1  PLT 268 331 278 257 236    Cardiac Enzymes:  Recent Labs Lab 11/21/13 1900 11/22/13 0230 11/22/13 0810 11/23/13 0244  TROPONINI 1.35* 3.77* 4.52* 2.84*   BNP (last 3 results)  Recent Labs  11/20/13 1705  PROBNP 832.1*   CBG:  Recent Labs Lab 11/23/13 1142 11/23/13 2022 11/24/13 0018 11/24/13 0424 11/24/13 0722  GLUCAP 176* 265* 232* 207* 214*    Recent Results (from the past 240 hour(s))  CULTURE, BLOOD (ROUTINE X 2)     Status: None   Collection Time    11/20/13  6:50 PM      Result Value Ref Range Status   Specimen Description BLOOD RIGHT ARM   Final   Special Requests BOTTLES DRAWN AEROBIC ONLY 10CC   Final   Culture  Setup Time  Final   Value: 11/21/2013 00:21     Performed at [facility]   Culture     Final   Value:        BLOOD CULTURE RECEIVED NO GROWTH TO DATE CULTURE WILL BE HELD FOR 5 DAYS BEFORE ISSUING A FINAL NEGATIVE REPORT     Performed at [facility]   Report Status PENDING   Incomplete  CULTURE, BLOOD (ROUTINE X 2)     Status: None   Collection Time    11/20/13  6:55 PM      Result Value Ref Range Status   Specimen Description BLOOD RIGHT HAND   Final   Special Requests BOTTLES DRAWN AEROBIC ONLY 10CC   Final   Culture  Setup Time     Final   Value: 11/21/2013 00:21     Performed at [facility]   Culture     Final   Value:        BLOOD CULTURE RECEIVED NO GROWTH TO DATE CULTURE WILL BE HELD FOR 5 DAYS BEFORE ISSUING A FINAL NEGATIVE REPORT     Performed at [facility]   Report Status PENDING   Incomplete  URINE CULTURE     Status: None   Collection Time    11/20/13  8:18 PM      Result Value Ref Range Status   Specimen Description URINE, CATHETERIZED   Final   Special Requests NONE   Final   Culture  Setup Time     Final   Value: 11/21/2013 01:18     Performed at [facility]     Final   Value: NO GROWTH     Performed at [facility]   Culture     Final   Value: NO GROWTH      Performed at [facility]   Report Status 11/22/2013 FINAL   Final  MRSA PCR SCREENING     Status: None   Collection Time    11/20/13  9:52 PM      Result Value Ref Range Status   MRSA by PCR NEGATIVE  NEGATIVE Final   Comment:            The GeneXpert MRSA Assay (FDA     approved for NASAL specimens     only), is one component of a     comprehensive MRSA colonization     surveillance program. It is not     intended to diagnose MRSA     infection nor to guide or     monitor treatment for     MRSA infections.  RESPIRATORY VIRUS PANEL     Status: None   Collection Time    11/22/13  7:58 PM      Result Value Ref Range Status   Source - RVPAN NASAL SWAB   Corrected   Comment: CORRECTED ON 07/27 AT 2040: PREVIOUSLY REPORTED AS NASAL SWAB   Respiratory Syncytial Virus A NOT DETECTED   Final   Respiratory Syncytial Virus B NOT DETECTED   Final   Influenza A NOT DETECTED   Final   Influenza B NOT DETECTED   Final   Parainfluenza 1 NOT DETECTED   Final   Parainfluenza 2 NOT DETECTED   Final   Parainfluenza 3 NOT DETECTED   Final   Metapneumovirus NOT DETECTED   Final   Rhinovirus NOT DETECTED   Final   Adenovirus NOT DETECTED   Final  Influenza A H1 NOT DETECTED   Final   Influenza A H3 NOT DETECTED   Final   Comment: (NOTE)           Normal Reference Range for each Analyte: NOT DETECTED     Testing performed using the Luminex xTAG Respiratory Viral Panel test     kit.     The analytical performance characteristics of this assay have been     determined by [facility].  The modifications have not been     cleared or approved by the FDA. This assay has been validated pursuant     to the CLIA regulations and is used for clinical purposes.     Performed at [facility]     Studies:  Recent x-ray studies have been reviewed in detail by the Attending Physician  Scheduled Meds:  Scheduled Meds: . antiseptic oral rinse  15 mL Mouth Rinse BID  . aspirin   300 mg Rectal Daily  . cloNIDine  0.3 mg Transdermal Weekly  . heparin subcutaneous  5,000 Units Subcutaneous 3 times per day  . hydrALAZINE  10 mg Intravenous Q6H  . insulin aspart  0-9 Units Subcutaneous 6 times per day  . insulin glargine  25 Units Subcutaneous Daily  . metoprolol  5 mg Intravenous Q4H  . piperacillin-tazobactam (ZOSYN)  IV  3.375 g Intravenous Q8H  . sodium chloride  3 mL Intravenous Q12H  . vancomycin  1,000 mg Intravenous Q48H    Time spent on care of this patient: 40 mins   Allie Bossier , MD   [facility] Office  [phone] Pager - [phone]  On-Call/Text Page:      [email or web address]      password [email or web address]  If 7PM-7AM, please contact night-coverage [email or web address] Password [facility] 11/24/2013, 10:37 AM   LOS: 4 days

## 2013-11-24 NOTE — Progress Notes (Signed)
[facility] FOR [facility] LAB REPLACEMENT ONLY  The patient does not apply for the [facility] Electrolyte [facility] Protocol based on the criteria listed below:   Is GFR >/= 40 ml/min? No.  Patient's GFR today is 24    Abnormal electrolyte(s): K3.3   If a panic level lab has been reported, has the CCM MD in charge been notified? Yes.  .   Physician:  Wilford Sports Feinstein, MD  Melrose NakayamaChisholm, Gisela Lea William 11/24/2013 5:29 AM

## 2013-11-24 NOTE — Progress Notes (Signed)
Pt transferred to room 2C18 with medications, chart, belongings (including teeth, hair net, and small electronic tablet). Pts daughter, Deforest HoylesKimberly Morris, notified of transfer. Pt denies complaints at this time and VSS.  Dawson BillsKim Thia Olesen, RN

## 2013-11-24 NOTE — Progress Notes (Signed)
This am pt removed Panda tube from nose. Pt did not want another NG tube inserted. I asked the pt several times and let her know that this was the only way she could have oral food and drinks. Pt understood and still refused to have a new NG tube placed.

## 2013-11-24 NOTE — Progress Notes (Signed)
Speech Language Pathology Treatment: Dysphagia  Patient Details Name: Amado Nashatricia Booz MRN: [id number] DOB: [birth year] Today's Date: 11/24/2013 Time: [id number] SLP Time Calculation (min): 43 min  Assessment / Plan / Recommendation Clinical Impression  Treatment focused on habilitation of respiratory strength/support, phonation/vocal adduction via therapeutic for increased lingual manipulation and transit of boluses exercises with son at bedside.   She required maximum cues with mild tactile pressure on diaphragm during sustained "ah" exercises.  Resistance exercises with pushing on bed/SLP's hand to improve vocal cord adduction and tracheal protection during swallowing with max verbal/tactile cues.  Dysarthria is severe involving respiratory, phonatory and articulatory tracts. Hypoglossal nerve appears damaged as pt. is unable to elevate anterior portion of tongue, resulting in significant difficulty transiting boluses anterior to posterior oral cavity.  Trial of puree (applesauce) included delayed oral transit ranging from 15-90 seconds.  No reflexive coughing present. Encouraged pt./family to speak, practice deep inhalations, coughs (unable to produce volitional cough).  ST will continue efforts.   HPI HPI: 68 [facility], diagnosed with sepsis.  Pt has h/o CVA with expressive aphasia/motor planning issues, dysphagia, dysarthria and right hemiparesis.  CXR negative - chronic lung changes.  Pt for MRI, CT head and repeat CXR today.  She resides in SNF but premorbid diet not listed in med rec form.  Swallow evaluation ordered.    Pertinent Vitals WDL  SLP Plan  Continue with current plan of care    Recommendations Diet recommendations: NPO              Oral Care Recommendations:  (per protocol, appears to have thrush?) Follow up Recommendations: Skilled Nursing facility Plan: Continue with current plan of care    GO     Royce MacadamiaLisa Willis Leevi Cullars M.Ed [employer]SLP Pager  [phone]  11/24/2013

## 2013-11-25 ENCOUNTER — Inpatient Hospital Stay (HOSPITAL_COMMUNITY): Payer: Medicare Other

## 2013-11-25 DIAGNOSIS — E876 Hypokalemia: Secondary | ICD-10-CM

## 2013-11-25 DIAGNOSIS — R509 Fever, unspecified: Secondary | ICD-10-CM

## 2013-11-25 DIAGNOSIS — R109 Unspecified abdominal pain: Secondary | ICD-10-CM

## 2013-11-25 LAB — CBC WITH DIFFERENTIAL/PLATELET
BASOS ABS: 0 10*3/uL (ref 0.0–0.1)
BASOS PCT: 0 % (ref 0–1)
Basophils Absolute: 0 10*3/uL (ref 0.0–0.1)
Basophils Relative: 0 % (ref 0–1)
EOS ABS: 0.1 10*3/uL (ref 0.0–0.7)
EOS PCT: 1 % (ref 0–5)
EOS PCT: 0 % (ref 0–5)
Eosinophils Absolute: 0 10*3/uL (ref 0.0–0.7)
HCT: 35.7 % — ABNORMAL LOW (ref 36.0–46.0)
HEMATOCRIT: 42 % (ref 36.0–46.0)
Hemoglobin: 10.9 g/dL — ABNORMAL LOW (ref 12.0–15.0)
Hemoglobin: 12.9 g/dL (ref 12.0–15.0)
Lymphocytes Relative: 24 % (ref 12–46)
Lymphocytes Relative: 20 % (ref 12–46)
Lymphs Abs: 2.7 10*3/uL (ref 0.7–4.0)
Lymphs Abs: 2.1 10*3/uL (ref 0.7–4.0)
MCH: 27.1 pg (ref 26.0–34.0)
MCH: 27.3 pg (ref 26.0–34.0)
MCHC: 30.5 g/dL (ref 30.0–36.0)
MCHC: 30.7 g/dL (ref 30.0–36.0)
MCV: 88.8 fL (ref 78.0–100.0)
MCV: 89 fL (ref 78.0–100.0)
MONO ABS: 0.9 10*3/uL (ref 0.1–1.0)
MONOS PCT: 9 % (ref 3–12)
Monocytes Absolute: 1.2 10*3/uL — ABNORMAL HIGH (ref 0.1–1.0)
Monocytes Relative: 10 % (ref 3–12)
Neutro Abs: 7.2 10*3/uL (ref 1.7–7.7)
Neutro Abs: 7.2 10*3/uL (ref 1.7–7.7)
Neutrophils Relative %: 65 % (ref 43–77)
Neutrophils Relative %: 71 % (ref 43–77)
PLATELETS: 278 10*3/uL (ref 150–400)
Platelets: 256 10*3/uL (ref 150–400)
RBC: 4.02 MIL/uL (ref 3.87–5.11)
RBC: 4.72 MIL/uL (ref 3.87–5.11)
RDW: 15.1 % (ref 11.5–15.5)
RDW: 15 % (ref 11.5–15.5)
WBC: 11.2 10*3/uL — AB (ref 4.0–10.5)
WBC: 10.2 10*3/uL (ref 4.0–10.5)

## 2013-11-25 LAB — COMPREHENSIVE METABOLIC PANEL
ALT: 20 U/L (ref 0–35)
ALT: 18 U/L (ref 0–35)
AST: 29 U/L (ref 0–37)
AST: 27 U/L (ref 0–37)
Albumin: 2.7 g/dL — ABNORMAL LOW (ref 3.5–5.2)
Albumin: 2.7 g/dL — ABNORMAL LOW (ref 3.5–5.2)
Alkaline Phosphatase: 72 U/L (ref 39–117)
Alkaline Phosphatase: 68 U/L (ref 39–117)
Anion gap: 17 — ABNORMAL HIGH (ref 5–15)
Anion gap: 17 — ABNORMAL HIGH (ref 5–15)
BUN: 40 mg/dL — ABNORMAL HIGH (ref 6–23)
BUN: 38 mg/dL — AB (ref 6–23)
CALCIUM: 8.8 mg/dL (ref 8.4–10.5)
CHLORIDE: 111 meq/L (ref 96–112)
CO2: 23 meq/L (ref 19–32)
CO2: 19 mEq/L (ref 19–32)
CREATININE: 1.95 mg/dL — AB (ref 0.50–1.10)
Calcium: 8.8 mg/dL (ref 8.4–10.5)
Chloride: 113 mEq/L — ABNORMAL HIGH (ref 96–112)
Creatinine, Ser: 1.85 mg/dL — ABNORMAL HIGH (ref 0.50–1.10)
GFR calc Af Amer: 30 mL/min — ABNORMAL LOW (ref >90)
GFR, EST AFRICAN AMERICAN: 32 mL/min — AB (ref >90)
GFR, EST NON AFRICAN AMERICAN: 26 mL/min — AB (ref >90)
GFR, EST NON AFRICAN AMERICAN: 27 mL/min — AB (ref >90)
GLUCOSE: 131 mg/dL — AB (ref 70–99)
Glucose, Bld: 146 mg/dL — ABNORMAL HIGH (ref 70–99)
Potassium: 3.1 mEq/L — ABNORMAL LOW (ref 3.7–5.3)
Potassium: 3 mEq/L — ABNORMAL LOW (ref 3.7–5.3)
Sodium: 151 mEq/L — ABNORMAL HIGH (ref 137–147)
Sodium: 149 mEq/L — ABNORMAL HIGH (ref 137–147)
Total Bilirubin: <0.2 mg/dL — ABNORMAL LOW (ref 0.3–1.2)
Total Bilirubin: 0.2 mg/dL — ABNORMAL LOW (ref 0.3–1.2)
Total Protein: 7.1 g/dL (ref 6.0–8.3)
Total Protein: 7.3 g/dL (ref 6.0–8.3)

## 2013-11-25 LAB — LACTIC ACID, PLASMA: Lactic Acid, Venous: 1 mmol/L (ref 0.5–2.2)

## 2013-11-25 LAB — GLUCOSE, CAPILLARY
GLUCOSE-CAPILLARY: 149 mg/dL — AB (ref 70–99)
GLUCOSE-CAPILLARY: 130 mg/dL — AB (ref 70–99)
Glucose-Capillary: 142 mg/dL — ABNORMAL HIGH (ref 70–99)
Glucose-Capillary: 157 mg/dL — ABNORMAL HIGH (ref 70–99)
Glucose-Capillary: 173 mg/dL — ABNORMAL HIGH (ref 70–99)
Glucose-Capillary: 202 mg/dL — ABNORMAL HIGH (ref 70–99)
Glucose-Capillary: 291 mg/dL — ABNORMAL HIGH (ref 70–99)

## 2013-11-25 MED ORDER — VANCOMYCIN HCL IN DEXTROSE 750-5 MG/150ML-% IV SOLN
750.0000 mg | INTRAVENOUS | Status: DC
  Filled 2013-11-25: qty 150

## 2013-11-25 MED ORDER — HYDRALAZINE HCL 20 MG/ML IJ SOLN
25.0000 mg | Freq: Four times a day (QID) | INTRAMUSCULAR | Status: DC
  Administered 2013-11-26 – 2013-11-27 (×6): 25 mg via INTRAVENOUS
  Filled 2013-11-25 (×5): qty 1.25
  Filled 2013-11-25: qty 2
  Filled 2013-11-25: qty 1.25
  Filled 2013-11-25: qty 2
  Filled 2013-11-25: qty 1.25
  Filled 2013-11-25: qty 2
  Filled 2013-11-25: qty 1.25

## 2013-11-25 MED ORDER — METOPROLOL TARTRATE 1 MG/ML IV SOLN
10.0000 mg | INTRAVENOUS | Status: DC
  Administered 2013-11-25 – 2013-11-28 (×13): 10 mg via INTRAVENOUS
  Administered 2013-11-28: 5 mg via INTRAVENOUS
  Administered 2013-11-28 – 2013-12-02 (×23): 10 mg via INTRAVENOUS
  Filled 2013-11-25 (×48): qty 10

## 2013-11-25 MED ORDER — METOPROLOL TARTRATE 1 MG/ML IV SOLN: 15.0000 mg | INTRAVENOUS | Status: DC

## 2013-11-25 MED ORDER — LABETALOL HCL 5 MG/ML IV SOLN: 15.0000 mg | INTRAVENOUS | Status: DC | PRN

## 2013-11-25 MED ORDER — NITROGLYCERIN IN D5W 200-5 MCG/ML-% IV SOLN
2.0000 ug/min | INTRAVENOUS | Status: DC
  Administered 2013-11-25 – 2013-11-28 (×2): 10 ug/min via INTRAVENOUS
  Filled 2013-11-25 (×2): qty 250

## 2013-11-25 MED ORDER — LEVOFLOXACIN IN D5W 750 MG/150ML IV SOLN
750.0000 mg | INTRAVENOUS | Status: DC
  Administered 2013-11-25 – 2013-11-27 (×2): 750 mg via INTRAVENOUS
  Filled 2013-11-25 (×2): qty 150

## 2013-11-25 MED ORDER — METOPROLOL TARTRATE 1 MG/ML IV SOLN: 10.0000 mg | INTRAVENOUS | Status: DC

## 2013-11-25 MED ORDER — NITROGLYCERIN IN D5W 200-5 MCG/ML-% IV SOLN
5.0000 ug/min | INTRAVENOUS | Status: DC
  Administered 2013-11-25: 5 ug/min via INTRAVENOUS
  Filled 2013-11-25: qty 250

## 2013-11-25 MED ORDER — ACETAMINOPHEN 650 MG RE SUPP
650.0000 mg | RECTAL | Status: DC | PRN
  Administered 2013-11-25: 650 mg via RECTAL
  Filled 2013-11-25: qty 1

## 2013-11-25 MED ORDER — INSULIN GLARGINE 100 UNIT/ML ~~LOC~~ SOLN
26.0000 [IU] | Freq: Every day | SUBCUTANEOUS | Status: DC
  Administered 2013-11-26 – 2013-11-28 (×3): 26 [IU] via SUBCUTANEOUS
  Filled 2013-11-25 (×3): qty 0.26

## 2013-11-25 NOTE — Progress Notes (Signed)
[facility] TEAM 1 - Stepdown/ICU TEAM Progress Note  Allison Washington XHB:[id number] DOB: Feb 07, 1946 DOA: 11/20/2013 PCP: [name], MD  Admit HPI / Brief Narrative: 68 y.o. F w/ Hx Depression, CVA x5, hemiplegia and hemiparesis, HTN, chronic pain (AKA), IDDM, & Seizure d/o who presented with encephalopathy. Pt is a resident at local SNF. Per report, pt was unresponsive despite vigorous arousal. Family reported pt has an overall fairly low functional level at baseline. Blood sugar was low on EMS arrival. Was given D50 with blood sugars increasing into 200s. There was reported multiple episodes of vomiting in transport to ER.   On arrival to ER, temp 97.6, HR 70s-100s, BP 810F-751W systolic, Satting >25% on non rebreather. WBC 16.7, Hgb 12.5, K 3.5, Cr 2.8 (baselin 1.9), Glu 182. Lactate 2.3. Trop WNL. Head CT with no acute findings. CXR with chronic lung changes with superimposed edema without infiltrate. Pt with fentanyl patch in place on arrival. This was removed. Pt was given small dose of narcan with subsequent improvement in mentation.  HPI/Subjective: Pt is alert.  She smiles frequently, and nods head in response to my questions.  Her son at the bedside states that she is much closer to her usual self.    Assessment/Plan:  NSTEMI: -Suffered a bradycardic arrest in the setting of respiratory distress -troponin increase likely related to demand ischemia - trop is now declining  -Per [name] not a candidate for aggressive cardiac measures  -Continue medical therapy  Severely uncontrolled Hypertension -BP remains very poorly controlled - pt does not appear to be in pain or distress - change BB to coreg (better actual anti-HTN activity) once able to take oral meds - further titrate meds - follow trend   Diastolic CHF -See NSTEMI -See HTN  Acute on chronic stage III renal failure: -renal function is slowly improving - baseline crt appears to be ~1.6-1.8 -control BP -hold  ACE/ARB for now   Metabolic Encephalopathy -Patient back to baseline / able to nod yes and no to simple questions    Aspiration PNA -continue antibiotics for 7 day course  -NPO - failed swallow study on 7/28  -pt pulled out her feeding tube 7/28 early AM and pt has refused replacement  -SLF to re-eval today - if not cleared for oral intake, will then need to readdress goals of care/tx plan   Uncontrolled DM A1c 9.5 - adjust tx and follow closely  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 (neurology) [name] (cardiology) [name] (PCCM)  Procedure/Significant Events: 7/25 EEG; abnormal with moderate generalized continuous nonspecific slowing of cerebral activity. This pattern of slowing can be seen with a wide variety of encephalopathies, including metabolic and toxic encephalopathies, as well as degenerative CNS disorders. No evidence of an epileptic disorder  7/26 echocardiogram - Left ventricle: moderate LVH. LVEF= 65%-to 70%. - (grade 1 diastolic dysfunction).  Antibiotics: Zosyn 7/24 >> Vanc 7/24 >>  DVT prophylaxis: Heparin subcutaneous  Objective: VITAL SIGNS: Blood pressure 226/72, pulse 90, temperature 100.1 F (37.8 C), temperature source Oral, resp. rate 29, height $RemoveBe'5\' 3"'GmKaYKIQY$  (1.6 m), weight 76.8 kg (169 lb 5 oz), SpO2 95.00%.  Intake/Output Summary (Last 24 hours) at 11/25/13 1622 Last data filed at 11/25/13 1300  Gross per 24 hour  Intake  1150 ml  Output      0 ml  Net   1150 ml   Exam: General: No acute respiratory distress Lungs: Clear to auscultation bilaterally without wheezes or crackles Cardiovascular: Regular rate and rhythm without murmur gallop or rub  Abdomen: Nontender, nondistended, soft, bowel sounds positive, no rebound, no ascites, no  appreciable mass Extremities: No significant cyanosis, clubbing, or edema left lower extremity  Data Reviewed: Basic Metabolic Panel:  Recent Labs Lab 11/20/13 2238  11/21/13 2055 11/22/13 0230 11/23/13 0244 11/24/13 0426 11/25/13 0320  NA  --   < > 148* 147 147 148* 151*  K  --   < > 4.2 3.6* 3.1* 3.3* 3.1*  CL  --   < > 106 103 106 112 111  CO2  --   < > 17* $Rem'23 23 23 23  'deIa$ GLUCOSE  --   < > 209* 232* 227* 254* 146*  BUN  --   < > 41* 46* 54* 55* 40*  CREATININE  --   < > 2.34* 2.34* 2.26* 2.31* 1.95*  CALCIUM  --   < > 9.3 8.9 8.8 8.8 8.8  MG 2.3  --  2.3  --   --   --   --   PHOS  --   --  5.4*  --   --   --   --   < > = values in this interval not displayed.  Liver Function Tests:  Recent Labs Lab 11/21/13 2055 11/22/13 0230 11/23/13 0244 11/24/13 0426 11/25/13 0320  AST 69* 56* 40* 30 29  ALT $Re'28 25 21 20 20  'kWM$ ALKPHOS 113 97 75 68 72  BILITOT 0.3 <0.2* <0.2* <0.2* <0.2*  PROT 8.3 7.5 6.7 6.8 7.1  ALBUMIN 3.1* 2.8* 2.5* 2.6* 2.7*   No results found for this basename: LIPASE, AMYLASE,  in the last 168 hours  Recent Labs Lab 11/21/13 1000  AMMONIA 40   CBC:  Recent Labs Lab 11/21/13 2055 11/22/13 0230 11/23/13 0244 11/24/13 0426 11/25/13 0320  WBC 14.1* 10.6* 11.2* 10.4 11.2*  NEUTROABS 9.0* 8.1* 7.8* 7.2 7.2  HGB 12.1 11.0* 10.3* 9.9* 10.9*  HCT 39.8 35.4* 32.9* 31.9* 35.7*  MCV 89.6 87.6 87.3 89.1 88.8  PLT 331 278 257 236 278   Cardiac Enzymes:  Recent Labs Lab 11/21/13 1900 11/22/13 0230 11/22/13 0810 11/23/13 0244  TROPONINI 1.35* 3.77* 4.52* 2.84*   BNP (last 3 results)  Recent Labs  11/20/13 1705  PROBNP 832.1*   CBG:  Recent Labs Lab 11/24/13 2022 11/25/13 0034 11/25/13 0437 11/25/13 0740 11/25/13 1152  GLUCAP 196* 202* 173* 142* 157*    Recent Results (from the past 240 hour(s))  CULTURE, BLOOD (ROUTINE X 2)     Status: None   Collection Time    11/20/13  6:50 PM      Result Value Ref Range Status   Specimen  Description BLOOD RIGHT ARM   Final   Special Requests BOTTLES DRAWN AEROBIC ONLY 10CC   Final   Culture  Setup Time     Final   Value: 11/21/2013 00:21     Performed at [facility]   Culture     Final   Value:        BLOOD CULTURE RECEIVED NO GROWTH TO DATE CULTURE WILL BE HELD FOR 5 DAYS BEFORE ISSUING A FINAL NEGATIVE REPORT     Performed at [facility]   Report Status PENDING   Incomplete  CULTURE,  BLOOD (ROUTINE X 2)     Status: None   Collection Time    11/20/13  6:55 PM      Result Value Ref Range Status   Specimen Description BLOOD RIGHT HAND   Final   Special Requests BOTTLES DRAWN AEROBIC ONLY 10CC   Final   Culture  Setup Time     Final   Value: 11/21/2013 00:21     Performed at [facility]   Culture     Final   Value:        BLOOD CULTURE RECEIVED NO GROWTH TO DATE CULTURE WILL BE HELD FOR 5 DAYS BEFORE ISSUING A FINAL NEGATIVE REPORT     Performed at [facility]   Report Status PENDING   Incomplete  URINE CULTURE     Status: None   Collection Time    11/20/13  8:18 PM      Result Value Ref Range Status   Specimen Description URINE, CATHETERIZED   Final   Special Requests NONE   Final   Culture  Setup Time     Final   Value: 11/21/2013 01:18     Performed at [facility]     Final   Value: NO GROWTH     Performed at [facility]   Culture     Final   Value: NO GROWTH     Performed at [facility]   Report Status 11/22/2013 FINAL   Final  MRSA PCR SCREENING     Status: None   Collection Time    11/20/13  9:52 PM      Result Value Ref Range Status   MRSA by PCR NEGATIVE  NEGATIVE Final   Comment:            The GeneXpert MRSA Assay (FDA     approved for NASAL specimens     only), is one component of a     comprehensive MRSA colonization     surveillance program. It is not     intended to diagnose MRSA     infection nor to guide or     monitor treatment for     MRSA infections.    RESPIRATORY VIRUS PANEL     Status: None   Collection Time    11/22/13  7:58 PM      Result Value Ref Range Status   Source - RVPAN NASAL SWAB   Corrected   Comment: CORRECTED ON 07/27 AT 2040: PREVIOUSLY REPORTED AS NASAL SWAB   Respiratory Syncytial Virus A NOT DETECTED   Final   Respiratory Syncytial Virus B NOT DETECTED   Final   Influenza A NOT DETECTED   Final   Influenza B NOT DETECTED   Final   Parainfluenza 1 NOT DETECTED   Final   Parainfluenza 2 NOT DETECTED   Final   Parainfluenza 3 NOT DETECTED   Final   Metapneumovirus NOT DETECTED   Final   Rhinovirus NOT DETECTED   Final   Adenovirus NOT DETECTED   Final   Influenza A H1 NOT DETECTED   Final   Influenza A H3 NOT DETECTED   Final   Comment: (NOTE)           Normal Reference Range for each Analyte: NOT DETECTED     Testing performed using the Luminex xTAG Respiratory Viral Panel test     kit.     The analytical performance characteristics of this  assay have been     determined by [facility].  The modifications have not been     cleared or approved by the FDA. This assay has been validated pursuant     to the CLIA regulations and is used for clinical purposes.     Performed at [facility]     Studies:  Recent x-ray studies have been reviewed in detail by the Attending Physician  Scheduled Meds:  Scheduled Meds: . antiseptic oral rinse  15 mL Mouth Rinse BID  . aspirin  300 mg Rectal Daily  . cloNIDine  0.4 mg Transdermal Weekly  . heparin subcutaneous  5,000 Units Subcutaneous 3 times per day  . hydrALAZINE  20 mg Intravenous Q6H  . insulin aspart  0-9 Units Subcutaneous 6 times per day  . insulin glargine  25 Units Subcutaneous Daily  . metoprolol  10 mg Intravenous Q4H  . piperacillin-tazobactam (ZOSYN)  IV  3.375 g Intravenous Q8H  . sodium chloride  3 mL Intravenous Q12H  . vancomycin  750 mg Intravenous Q24H    Time spent on care of this patient: 35 mins  Cherene Altes,  MD [facility] For Consults/Admissions - Flow Manager - [phone] Office  [phone] Pager [phone]  On-Call/Text Page:      [email or web address]      password [facility]  11/25/2013, 4:22 PM   LOS: 5 days

## 2013-11-25 NOTE — Progress Notes (Addendum)
ANTIBIOTIC CONSULT NOTE - Follow-up  Pharmacy Consult for Vancomycin, Zosyn Indication: Sepsis   No Known Allergies  Patient Measurements: Height: 5\' 3"  (160 cm) Weight: 169 lb 5 oz (76.8 kg) IBW/kg (Calculated) : 52.4 Adjusted Body Weight: n/a   Vital Signs: Temp: 99.1 F (37.3 C) (07/29 0741) Temp src: Axillary (07/29 0741) BP: 212/77 mmHg (07/29 0757) Pulse Rate: 112 (07/29 0741) Intake/Output from previous day: 07/28 0701 - 07/29 0700 In: 1050 [I.V.:650; IV Piggyback:400] Out: -  Intake/Output from this shift:    Labs:  Recent Labs  11/23/13 0244 11/24/13 0426 11/25/13 0320  WBC 11.2* 10.4 11.2*  HGB 10.3* 9.9* 10.9*  PLT 257 236 278  CREATININE 2.26* 2.31* 1.95*   Estimated Creatinine Clearance: 28.2 ml/min (by C-G formula based on Cr of 1.95). No results found for this basename: VANCOTROUGH, VANCOPEAK, VANCORANDOM, GENTTROUGH, GENTPEAK, GENTRANDOM, TOBRATROUGH, TOBRAPEAK, TOBRARND, AMIKACINPEAK, AMIKACINTROU, AMIKACIN,  in the last 72 hours    Assessment: [age] YOF who presented to the ED with altered mental status. Pt continues on vanc/zosyn for aspiration PNA. WBC =11.2, tmax= 101, SCr 1.95 (improved). CrCl ~ 30 mL/min. Minimal UOP documented.  7/24 vanc >> 7/24 zosyn >>  7/24 urine>>neg 7/24 blood x2 >> NGTD 7/26 resp virus>>not detected  Goal of Therapy:  Vancomycin trough level 15-20 mcg/ml Resolution of infection   Plan:  -Change vancomycin yo 750mg  IV q24hr -Zosyn 3.375 gm IV Q 8 hours  -Consider abx de-escalation -Monitor CBC, renal fx, cultures and patient's clinical progress  Harland Germanndrew Meyer, Pharm D 11/25/2013 9:24 AM   Addendum: MD narrowing antibiotics from Vancomycin and Zosyn to Levofloxacin for suspected aspiration PNA - planning on completing 7 days of antibiotics (through 7/31) per notes. - CrCl 28  Plan: 1. Levofloxacin 750mg  IV q48h  2. Monitor renal function, clinical course and LOT  Wilfred LacyWesley Xachary Hambly, PharmD Clinical  Pharmacist [phone] 11/25/2013, 4:31 PM

## 2013-11-25 NOTE — Progress Notes (Addendum)
S; contacted secondary to patient having abdominal pain and distention even though patient is n.p.o. in addition patient has been running a fever all day. Highest appears to be 38.4 which is patient's current temperature.   A/P 1. abdominal pain; Acute abdominal series; showed moderate air in stomach and colon, see results below -Place NG tube for decompression  2. Leukocytosis; see #1, obtain CBC, lactic acid, CMP  3. fever; panculture, Tylenol PR  4. Hypokalemia; potassium 10 mEq x 3 runs  5. HTN uncontrolled; nitroglycerin drip titrated to decrease MAP by 20-25% within 30-60 minutes; goal BP of 190- 180/110-100 within the next 2-6 hours.  -Increased PRN labetalol to 15 mg q 2 hr      7/29 DG abdomen 2 view;No acute abnormalities. Moderate air in the stomach and colon  without evidence of obstruction. 7/29 CXR; No acute abnormality. Chronic changes in both lungs.

## 2013-11-25 NOTE — Progress Notes (Signed)
Speech Language Pathology Treatment: Dysphagia  Patient Details Name: Allison Washington MRN: [id number] DOB: [birth year] Today's Date: 11/25/2013 Time: 1540-1610 SLP Time Calculation (min): 30 min  Assessment / Plan / Recommendation Clinical Impression  Pt. Seen for skilled therapeutic dysphagia treatment with son present.  Pt. exhibited increased delays today during oral manipulation and transit of puree bolus with likely delay to valleculae/pyriforms of 2-3 minutes with max verbal/visual cues to elicit swallow response.  Exercises for enhancement of respiratory strength, vocal cord adduction and respiratory/phonatory support performed with max cues.  Able to vocalize 1-2 seconds with very low vocal intensity.  SLP discussed options for po's with pt. And family and she adamantly shakes her head to PEG; SLP educated she can still eat with PEG (trials of puree and advancing as soon as able) but presently she is not getting any nutrition.  Uncertain if repeating MBS would be beneficial although she continues to have significant oral and pharyngeal delay?  Will discuss with MD next date.   HPI HPI: [id number] yo female [facility]MS, diagnosed with sepsis.  Pt has h/o CVA with expressive aphasia/motor planning issues, dysphagia, dysarthria and right hemiparesis.  CXR negative - chronic lung changes.  Pt for MRI, CT head and repeat CXR today.  She resides in SNF but premorbid diet not listed in med rec form.  Swallow evaluation ordered.    Pertinent Vitals WDL  SLP Plan  Continue with current plan of care    Recommendations Diet recommendations: NPO              Oral Care Recommendations: Oral care BID Follow up Recommendations: Skilled Nursing facility Plan: Continue with current plan of care    GO     Allison Washington, Allison Washington 11/25/2013, 4:18 PM

## 2013-11-26 ENCOUNTER — Inpatient Hospital Stay (HOSPITAL_COMMUNITY): Payer: Medicare Other

## 2013-11-26 ENCOUNTER — Encounter (HOSPITAL_COMMUNITY): Payer: Self-pay | Admitting: Radiology

## 2013-11-26 DIAGNOSIS — Z515 Encounter for palliative care: Secondary | ICD-10-CM

## 2013-11-26 DIAGNOSIS — D638 Anemia in other chronic diseases classified elsewhere: Secondary | ICD-10-CM

## 2013-11-26 DIAGNOSIS — G894 Chronic pain syndrome: Secondary | ICD-10-CM

## 2013-11-26 LAB — CBC
HCT: 38.2 % (ref 36.0–46.0)
HEMOGLOBIN: 11.8 g/dL — AB (ref 12.0–15.0)
MCH: 27.8 pg (ref 26.0–34.0)
MCHC: 30.9 g/dL (ref 30.0–36.0)
MCV: 90.1 fL (ref 78.0–100.0)
Platelets: 269 10*3/uL (ref 150–400)
RBC: 4.24 MIL/uL (ref 3.87–5.11)
RDW: 15.1 % (ref 11.5–15.5)
WBC: 14.1 10*3/uL — AB (ref 4.0–10.5)

## 2013-11-26 LAB — COMPREHENSIVE METABOLIC PANEL
ALBUMIN: 2.7 g/dL — AB (ref 3.5–5.2)
ALT: 17 U/L (ref 0–35)
AST: 25 U/L (ref 0–37)
Alkaline Phosphatase: 66 U/L (ref 39–117)
Anion gap: 19 — ABNORMAL HIGH (ref 5–15)
BILIRUBIN TOTAL: 0.2 mg/dL — AB (ref 0.3–1.2)
BUN: 40 mg/dL — ABNORMAL HIGH (ref 6–23)
CHLORIDE: 110 meq/L (ref 96–112)
CO2: 20 meq/L (ref 19–32)
Calcium: 8.7 mg/dL (ref 8.4–10.5)
Creatinine, Ser: 1.97 mg/dL — ABNORMAL HIGH (ref 0.50–1.10)
GFR calc Af Amer: 30 mL/min — ABNORMAL LOW (ref >90)
GFR, EST NON AFRICAN AMERICAN: 25 mL/min — AB (ref >90)
Glucose, Bld: 178 mg/dL — ABNORMAL HIGH (ref 70–99)
Potassium: 3.3 mEq/L — ABNORMAL LOW (ref 3.7–5.3)
SODIUM: 149 meq/L — AB (ref 137–147)
Total Protein: 6.9 g/dL (ref 6.0–8.3)

## 2013-11-26 LAB — URINE CULTURE
CULTURE: NO GROWTH
Colony Count: NO GROWTH

## 2013-11-26 LAB — GLUCOSE, CAPILLARY
GLUCOSE-CAPILLARY: 148 mg/dL — AB (ref 70–99)
Glucose-Capillary: 118 mg/dL — ABNORMAL HIGH (ref 70–99)
Glucose-Capillary: 151 mg/dL — ABNORMAL HIGH (ref 70–99)
Glucose-Capillary: 165 mg/dL — ABNORMAL HIGH (ref 70–99)
Glucose-Capillary: 180 mg/dL — ABNORMAL HIGH (ref 70–99)
Glucose-Capillary: 203 mg/dL — ABNORMAL HIGH (ref 70–99)

## 2013-11-26 LAB — MAGNESIUM: Magnesium: 1.9 mg/dL (ref 1.5–2.5)

## 2013-11-26 MED ORDER — PREGABALIN 25 MG PO CAPS
25.0000 mg | ORAL_CAPSULE | Freq: Every day | ORAL | Status: DC
  Administered 2013-11-26 – 2013-12-01 (×5): 25 mg via ORAL
  Filled 2013-11-26 (×6): qty 1

## 2013-11-26 MED ORDER — CLONIDINE HCL 0.3 MG/24HR TD PTWK
0.6000 mg | MEDICATED_PATCH | TRANSDERMAL | Status: DC
  Administered 2013-12-01: 0.6 mg via TRANSDERMAL
  Filled 2013-11-26: qty 2

## 2013-11-26 MED ORDER — IOHEXOL 300 MG/ML  SOLN
25.0000 mL | INTRAMUSCULAR | Status: AC
  Administered 2013-11-26 (×2): 25 mL via ORAL

## 2013-11-26 MED ORDER — DIAZEPAM 5 MG/ML IJ SOLN
2.5000 mg | Freq: Every day | INTRAMUSCULAR | Status: DC
  Administered 2013-11-26 – 2013-12-01 (×6): 2.5 mg via INTRAVENOUS
  Filled 2013-11-26 (×7): qty 2

## 2013-11-26 MED ORDER — DIAZEPAM 5 MG/ML IJ SOLN
2.5000 mg | INTRAMUSCULAR | Status: DC | PRN
  Administered 2013-12-01 – 2013-12-02 (×2): 2.5 mg via INTRAVENOUS
  Filled 2013-11-26 (×2): qty 2

## 2013-11-26 MED ORDER — POTASSIUM CHLORIDE 10 MEQ/100ML IV SOLN
10.0000 meq | INTRAVENOUS | Status: AC
  Administered 2013-11-26 (×3): 10 meq via INTRAVENOUS
  Filled 2013-11-26 (×3): qty 100

## 2013-11-26 MED ORDER — OXYCODONE HCL 20 MG/ML PO CONC
5.0000 mg | Freq: Two times a day (BID) | ORAL | Status: DC
  Administered 2013-11-26 – 2013-12-01 (×8): 5 mg via SUBLINGUAL
  Filled 2013-11-26 (×8): qty 1

## 2013-11-26 NOTE — Clinical Documentation Improvement (Signed)
Clinical Indicators:  Per Progress Note 11/25/2013 by Dr. Sharon SellerMcClung:  "Severely uncontrolled Hypertension -BP remains very poorly controlled - pt does not appear to be in pain or distress - change BB to coreg (better actual anti-HTN activity) once able to take oral meds - further titrate meds - follow trend".    Per progress note 11/26/13 by Dr. Joseph ArtWoods: "HTN uncontrolled; nitroglycerin drip titrated to decrease MAP by 20-25% within 30-60 minutes; goal BP of 190- 180/110-100 within the next 2-6 hours. -Increased PRN labetalol to 15 mg q 2 hr"  Possible Clinical Conditions:  -Accelerated Hypertension -Other Condition -Unable to Clinically Determine    Thank You, Jerral Ralphathy R Dystany Duffy ,RN Clinical Documentation Specialist:  [phone] [facility] Information Management

## 2013-11-26 NOTE — Progress Notes (Signed)
Dr. Blake DivineAkula called regarding the use of NGT for CT contrast to be given. Told to hold off on CT scan at this time. The son is to remain here to speak with Dr. Phillips OdorGolding from Palliative care.

## 2013-11-26 NOTE — Progress Notes (Signed)
[place] TEAM 1 - Stepdown/ICU TEAM Progress Note  Allison Washington MRN:[id number] DOB: 01/15/1946 DOA: 11/20/2013 PCP: ALEXANDER, ANNE D, MD  Admit HPI / Brief Narrative: 68 y.o. F w/ Hx Depression, CVA x5, hemiplegia and hemiparesis, HTN, chronic pain (AKA), IDDM, & Seizure d/o who presented with encephalopathy. Pt is a resident at local SNF. Per report, pt was unresponsive despite vigorous arousal. Family reported pt has an overall fairly low functional level at baseline. Blood sugar was low on EMS arrival. Was given D50 with blood sugars increasing into 200s. There was reported multiple episodes of vomiting in transport to ER.   On arrival to ER, temp 97.6, HR 70s-100s, BP 150s-230s systolic, Satting >95% on non rebreather. WBC 16.7, Hgb 12.5, K 3.5, Cr 2.8 (baselin 1.9), Glu 182. Lactate 2.3. Trop WNL. Head CT with no acute findings. CXR with chronic lung changes with superimposed edema without infiltrate. Pt with fentanyl patch in place on arrival. This was removed. Pt was given small dose of narcan with subsequent improvement in mentation.  HPI/Subjective: Pt is alert.  She smiles frequently, and nods head in response to my questions. None at bedside. She keeps pointing to the door but doesn t say anything. She appears to be comfortable.  Assessment/Plan:  NSTEMI: -Suffered a bradycardic arrest in the setting of respiratory distress -troponin increase likely related to demand ischemia - trop is now declining  -Per Dr. Crenshaw not a candidate for aggressive cardiac measures  -Continue medical therapy  Severely uncontrolled Hypertension Accelerated Hypertension -BP remains very poorly controlled - hence she was started back on the NTG drip, IV hydralazine 25 mg every 6 hours, IV labetalol 15 mg every 2 hours prn, IV metoprolol. Will change to labetalol drip for better BP control, if it doesn't improve. .   Diastolic CHF -See NSTEMI -See HTN  Acute on chronic stage III renal  failure: -renal function is slowly improving - baseline crt appears to be ~1.6-1.8 -control BP -hold ACE/ARB for now   Metabolic Encephalopathy -Patient back to baseline / able to nod yes and no to simple questions    Aspiration PNA -continue antibiotics for 7 day course  -NPO - failed swallow study on 7/28  -pt pulled out her feeding tube 7/28 early AM and pt has refused replacement  -SLP re evaluation today, patient continued to aspirate. Plan for MBS when the NG tube is out. She has refused a PEG tube 2 days ago.   Uncontrolled DM A1c 9.5 - adjust tx and follow closely. CBG (last 3)   Recent Labs  11/26/13 0422 11/26/13 0852 11/26/13 1225  GLUCAP 151* 180* 203*      Hx of PVD s/p R AKA Physical exam confirms pt has had a R AKA  Code Status: FULL Family Communication: spoke w/ son over the phone, who is upset that we are not feeding her. Explained in detail about the aspiration risk, but son wants to feed her despite her aspiration risk. Requested palliative care re consult today.  Disposition Plan: Return to [facility] when medically stable   Consultants: Dr. Charles Stewart (neurology) Dr. Dalton McLean (cardiology) Dr. Daniel Feinstein (PCCM)  Procedure/Significant Events: 7/25 EEG; abnormal with moderate generalized continuous nonspecific slowing of cerebral activity. This pattern of slowing can be seen with a wide variety of encephalopathies, including metabolic and toxic encephalopathies, as well as degenerative CNS disorders. No evidence of an epileptic disorder  7/26 echocardiogram - Left ventricle: moderate LVH. LVEF= 65%-to 70%. - (grade 1   diastolic dysfunction).  Antibiotics: Zosyn 7/24 >> Vanc 7/24 >>  DVT prophylaxis: Heparin subcutaneous  Objective: VITAL SIGNS: Blood pressure 200/84, pulse 111, temperature 100.8 F (38.2 C), temperature source Oral, resp. rate 27, height 5' 3" (1.6 m), weight 76.8 kg (169 lb 5 oz), SpO2  95.00%.  Intake/Output Summary (Last 24 hours) at 11/26/13 1438 Last data filed at 11/26/13 0850  Gross per 24 hour  Intake 927.38 ml  Output      0 ml  Net 927.38 ml   Exam: General: No acute respiratory distress Lungs: Clear to auscultation bilaterally without wheezes or crackles Cardiovascular: Regular rate and rhythm without murmur gallop or rub  Abdomen: Nontender, nondistended, soft, bowel sounds positive, no rebound, no ascites, no appreciable mass Extremities: No significant cyanosis, clubbing, or edema left lower extremity  Data Reviewed: Basic Metabolic Panel:  Recent Labs Lab 11/20/13 2238  11/21/13 2055  11/23/13 0244 11/24/13 0426 11/25/13 0320 11/25/13 2101 11/26/13 0401  NA  --   < > 148*  < > 147 148* 151* 149* 149*  K  --   < > 4.2  < > 3.1* 3.3* 3.1* 3.0* 3.3*  CL  --   < > 106  < > 106 112 111 113* 110  CO2  --   < > 17*  < > _0 GLUCOSE  --   < > 209*  < > 227* 254* 146* 131* 178*  BUN  --   < > 41*  < > 54* 55* 40* 38* 40*  CREATININE  --   < > 2.34*  < > 2.26* 2.31* 1.95* 1.85* 1.97*  CALCIUM  --   < > 9.3  < > 8.8 8.8 8.8 8.8 8.7  MG 2.3  --  2.3  --   --   --   --   --  1.9  PHOS  --   --  5.4*  --   --   --   --   --   --   < > = values in this interval not displayed.  Liver Function Tests:  Recent Labs Lab 11/23/13 0244 11/24/13 0426 11/25/13 0320 11/25/13 2101 11/26/13 0401  AST 40* _1 ALT _2 ALKPHOS 75 68 72 68 66  BILITOT <0.2* <0.2* <0.2* 0.2* 0.2*  PROT 6.7 6.8 7.1 7.3 6.9  ALBUMIN 2.5* 2.6* 2.7* 2.7* 2.7*   No results found for this basename: LIPASE, AMYLASE,  in the last 168 hours  Recent Labs Lab 11/21/13 1000  AMMONIA 40   CBC:  Recent Labs Lab 11/22/13 0230 11/23/13 0244 11/24/13 0426 11/25/13 0320 11/25/13 2101  WBC 10.6* 11.2* 10.4 11.2* 10.2  NEUTROABS 8.1* 7.8* 7.2 7.2 7.2  HGB 11.0* 10.3* 9.9* 10.9* 12.9  HCT 35.4* 32.9* 31.9* 35.7* 42.0  MCV 87.6 87.3 89.1 88.8 89.0   PLT 278 257 236 278 256   Cardiac Enzymes:  Recent Labs Lab 11/21/13 1900 11/22/13 0230 11/22/13 0810 11/23/13 0244  TROPONINI 1.35* 3.77* 4.52* 2.84*   BNP (last 3 results)  Recent Labs  11/20/13 1705  PROBNP 832.1*   CBG:  Recent Labs Lab 11/25/13 2038 11/26/13 0014 11/26/13 0422 11/26/13 0852 11/26/13 1225  GLUCAP 130* 118* 151* 180* 203*    Recent Results (from the past 240 hour(s))  CULTURE, BLOOD (ROUTINE X 2)     Status: None   Collection Time    11/20/13  6:50 PM  Result Value Ref Range Status   Specimen Description BLOOD RIGHT ARM   Final   Special Requests BOTTLES DRAWN AEROBIC ONLY 10CC   Final   Culture  Setup Time     Final   Value: 11/21/2013 00:21     Performed at [facility]   Culture     Final   Value:        BLOOD CULTURE RECEIVED NO GROWTH TO DATE CULTURE WILL BE HELD FOR 5 DAYS BEFORE ISSUING A FINAL NEGATIVE REPORT     Performed at [facility]   Report Status PENDING   Incomplete  CULTURE, BLOOD (ROUTINE X 2)     Status: None   Collection Time    11/20/13  6:55 PM      Result Value Ref Range Status   Specimen Description BLOOD RIGHT HAND   Final   Special Requests BOTTLES DRAWN AEROBIC ONLY 10CC   Final   Culture  Setup Time     Final   Value: 11/21/2013 00:21     Performed at [facility]   Culture     Final   Value:        BLOOD CULTURE RECEIVED NO GROWTH TO DATE CULTURE WILL BE HELD FOR 5 DAYS BEFORE ISSUING A FINAL NEGATIVE REPORT     Performed at [facility]   Report Status PENDING   Incomplete  URINE CULTURE     Status: None   Collection Time    11/20/13  8:18 PM      Result Value Ref Range Status   Specimen Description URINE, CATHETERIZED   Final   Special Requests NONE   Final   Culture  Setup Time     Final   Value: 11/21/2013 01:18     Performed at [facility]   Colony Count     Final   Value: NO GROWTH     Performed at [facility]   Culture     Final    Value: NO GROWTH     Performed at [facility]   Report Status 11/22/2013 FINAL   Final  MRSA PCR SCREENING     Status: None   Collection Time    11/20/13  9:52 PM      Result Value Ref Range Status   MRSA by PCR NEGATIVE  NEGATIVE Final   Comment:            The GeneXpert MRSA Assay (FDA     approved for NASAL specimens     only), is one component of a     comprehensive MRSA colonization     surveillance program. It is not     intended to diagnose MRSA     infection nor to guide or     monitor treatment for     MRSA infections.  RESPIRATORY VIRUS PANEL     Status: None   Collection Time    11/22/13  7:58 PM      Result Value Ref Range Status   Source - RVPAN NASAL SWAB   Corrected   Comment: CORRECTED ON 07/27 AT 2040: PREVIOUSLY REPORTED AS NASAL SWAB   Respiratory Syncytial Virus A NOT DETECTED   Final   Respiratory Syncytial Virus B NOT DETECTED   Final   Influenza A NOT DETECTED   Final   Influenza B NOT DETECTED   Final   Parainfluenza 1 NOT DETECTED   Final   Parainfluenza 2 NOT   DETECTED   Final   Parainfluenza 3 NOT DETECTED   Final   Metapneumovirus NOT DETECTED   Final   Rhinovirus NOT DETECTED   Final   Adenovirus NOT DETECTED   Final   Influenza A H1 NOT DETECTED   Final   Influenza A H3 NOT DETECTED   Final   Comment: (NOTE)           Normal Reference Range for each Analyte: NOT DETECTED     Testing performed using the Luminex xTAG Respiratory Viral Panel test     kit.     The analytical performance characteristics of this assay have been     determined by [facility].  The modifications have not been     cleared or approved by the FDA. This assay has been validated pursuant     to the CLIA regulations and is used for clinical purposes.     Performed at [facility]     Studies:  Recent x-ray studies have been reviewed in detail by the Attending Physician  Scheduled Meds:  Scheduled Meds: . antiseptic oral rinse  15 mL Mouth  Rinse BID  . aspirin  300 mg Rectal Daily  . cloNIDine  0.4 mg Transdermal Weekly  . heparin subcutaneous  5,000 Units Subcutaneous 3 times per day  . hydrALAZINE  25 mg Intravenous Q6H  . insulin aspart  0-9 Units Subcutaneous 6 times per day  . insulin glargine  26 Units Subcutaneous Daily  . levofloxacin (LEVAQUIN) IV  750 mg Intravenous Q48H  . metoprolol  10 mg Intravenous 6 times per day    Time spent on care of this patient: 35 mins   , MD [facility] For Consults/Admissions - Flow Manager - [phone] Office  [phone] Pager [phone]  On-Call/Text Page:      [email or web address]      password [email or web address]  11/26/2013, 2:38 PM   LOS: 6 days      

## 2013-11-26 NOTE — Progress Notes (Addendum)
Palliative Medicine Team Progress Note  Asked to meet with patient's family regarding acute decline. Patient's son and daughter were actively fighting at the bedside and refuse to meet together in the same room. I had a difficult conversation with her son Allison Washington. Allison Washington has very limited medical knowledge but feels he understands his mother condition perfectly as well as the medical issues. My assessment is that there are currently insurmountable conflicts in shared medical decision making evidenced by:  1. Requests for unrealistic and unreasonable care interventions. 2. Poor understanding of her prognosis and condition: "my mother has been ill for 30 years I see her everyday at the nursing home, she has had 5 strokes, she is only [age] years old, she will never die and we don't ever need to talk about that". "she loves to wheel around teh nursing home and grunt at people" "she is grateful for her life and I can see it in her eyes".  3. He tells me her DNR should be "torn up" and that he expects every doctor in the hospital to try to "bring her back" if her heart stops-he does not believe a CPR attempt or ventilator would cause pain or suffering.  4. He does not believe that aspiration can or will happen-he thinks her fever is from not having water and from dehydration despite the fact that she is receiving IV fluids.  5. He doesn't know why were are giving her "Nitroglycerin" to "raise her blood pressure" when she doesn't have a bad heart.  His request: "I want my mother to have a soft diet with very small sips of water after food".   I explicitly verbally consented him on that risk and informed him of possibility of respiratory failure and aspiration of food leading to need for ICU level intervention. He accepts that risk.  There is no formal HCPOA that I can determine or advance directive.  Would recommend proceeding with CT and all reasonable medical interventions. I have ordered sips of liquids-  patient has coughed consistently after being given those liquids-will monitor.  Hypertension/Agitation/Abdominal Pain: I have serious concerns about withdrawal from her home medications causuing her abdominal pain- she is on Fentanyl 50mg , lareg doses of Lyrica and also Lorazepam- she has relieved none of those regularly since admission. I have started very low dose oxycodone IR BID, low dose valium at night, and very small dose of lyrica to avoid w/d symptoms-the clonidine patch should help as well-will increase that.  Will follow up tomorrow on this difficult situation and continue to support this family-will ask chaplain to visit with son- my guess is his requests for unreasonable medical interventions are from some other need, guilt or personal suffering.  Time: 4:15-5:30 Total Time: 75 minutes Greater than 50%  of this time was spent counseling and coordinating care related to the above assessment and plan.  Allison MaltaElizabeth Golding, DO Palliative Medicine [phone]

## 2013-11-26 NOTE — Progress Notes (Addendum)
SLP Cancellation Note  Patient Details Name: Allison Washington MRN: [id number] DOB: [birth year]   Cancelled treatment:        Attempted dysphagia tx with trials applesauce/puree texture.  Pt. now with NGT for  decompression following acute abdominal series; showed moderate air in stomach and colon and unable to consume puree trials.  SLP discussed current swallow function with Dr. Blake DivineAkula who spoke with pt.'s son and Palliative care.  Family's decision is comfort/quality of life feeds, therefore plan is to initiate po's once NGT discontinued.  SLP will see pt. (after NGT d/c'd) and trial various consistencies for po recommendation.   Breck CoonsLisa Willis Grand ViewLitaker M.Ed [employer] IndustriesCCC-SLP Pager [phone]  11/26/2013

## 2013-11-27 DIAGNOSIS — G9341 Metabolic encephalopathy: Secondary | ICD-10-CM

## 2013-11-27 DIAGNOSIS — E870 Hyperosmolality and hypernatremia: Secondary | ICD-10-CM

## 2013-11-27 LAB — COMPREHENSIVE METABOLIC PANEL
ALT: 22 U/L (ref 0–35)
AST: 31 U/L (ref 0–37)
Albumin: 2.6 g/dL — ABNORMAL LOW (ref 3.5–5.2)
Alkaline Phosphatase: 64 U/L (ref 39–117)
Anion gap: 16 — ABNORMAL HIGH (ref 5–15)
BUN: 46 mg/dL — ABNORMAL HIGH (ref 6–23)
CALCIUM: 8.6 mg/dL (ref 8.4–10.5)
CO2: 23 mEq/L (ref 19–32)
CREATININE: 1.98 mg/dL — AB (ref 0.50–1.10)
Chloride: 112 mEq/L (ref 96–112)
GFR calc non Af Amer: 25 mL/min — ABNORMAL LOW (ref >90)
GFR, EST AFRICAN AMERICAN: 29 mL/min — AB (ref >90)
GLUCOSE: 97 mg/dL (ref 70–99)
Potassium: 2.8 mEq/L — CL (ref 3.7–5.3)
Sodium: 151 mEq/L — ABNORMAL HIGH (ref 137–147)
Total Bilirubin: 0.3 mg/dL (ref 0.3–1.2)
Total Protein: 6.6 g/dL (ref 6.0–8.3)

## 2013-11-27 LAB — GLUCOSE, CAPILLARY
GLUCOSE-CAPILLARY: 102 mg/dL — AB (ref 70–99)
GLUCOSE-CAPILLARY: 122 mg/dL — AB (ref 70–99)
GLUCOSE-CAPILLARY: 123 mg/dL — AB (ref 70–99)
GLUCOSE-CAPILLARY: 153 mg/dL — AB (ref 70–99)
Glucose-Capillary: 190 mg/dL — ABNORMAL HIGH (ref 70–99)
Glucose-Capillary: 229 mg/dL — ABNORMAL HIGH (ref 70–99)

## 2013-11-27 LAB — CBC WITH DIFFERENTIAL/PLATELET
Basophils Absolute: 0 10*3/uL (ref 0.0–0.1)
Basophils Relative: 0 % (ref 0–1)
EOS ABS: 0 10*3/uL (ref 0.0–0.7)
Eosinophils Relative: 0 % (ref 0–5)
HCT: 37.8 % (ref 36.0–46.0)
Hemoglobin: 11.8 g/dL — ABNORMAL LOW (ref 12.0–15.0)
LYMPHS ABS: 3.1 10*3/uL (ref 0.7–4.0)
Lymphocytes Relative: 19 % (ref 12–46)
MCH: 27.8 pg (ref 26.0–34.0)
MCHC: 31.2 g/dL (ref 30.0–36.0)
MCV: 89.2 fL (ref 78.0–100.0)
Monocytes Absolute: 1.1 10*3/uL — ABNORMAL HIGH (ref 0.1–1.0)
Monocytes Relative: 7 % (ref 3–12)
Neutro Abs: 12 10*3/uL — ABNORMAL HIGH (ref 1.7–7.7)
Neutrophils Relative %: 74 % (ref 43–77)
Platelets: 275 10*3/uL (ref 150–400)
RBC: 4.24 MIL/uL (ref 3.87–5.11)
RDW: 15.3 % (ref 11.5–15.5)
WBC: 16.3 10*3/uL — ABNORMAL HIGH (ref 4.0–10.5)

## 2013-11-27 LAB — CULTURE, BLOOD (ROUTINE X 2)
CULTURE: NO GROWTH
Culture: NO GROWTH

## 2013-11-27 LAB — CBC
HCT: 36.3 % (ref 36.0–46.0)
Hemoglobin: 11.4 g/dL — ABNORMAL LOW (ref 12.0–15.0)
MCH: 28.1 pg (ref 26.0–34.0)
MCHC: 31.4 g/dL (ref 30.0–36.0)
MCV: 89.4 fL (ref 78.0–100.0)
PLATELETS: 250 10*3/uL (ref 150–400)
RBC: 4.06 MIL/uL (ref 3.87–5.11)
RDW: 15.1 % (ref 11.5–15.5)
WBC: 16.8 10*3/uL — AB (ref 4.0–10.5)

## 2013-11-27 MED ORDER — DEXTROSE 5 % IV SOLN
INTRAVENOUS | Status: DC
  Administered 2013-11-27: 500 mL via INTRAVENOUS
  Administered 2013-11-28 (×2): 1000 mL via INTRAVENOUS
  Administered 2013-11-29: 999 mL via INTRAVENOUS
  Administered 2013-11-30: 1000 mL via INTRAVENOUS
  Administered 2013-11-30 – 2013-12-01 (×2): via INTRAVENOUS

## 2013-11-27 MED ORDER — POTASSIUM CHLORIDE 10 MEQ/100ML IV SOLN
10.0000 meq | INTRAVENOUS | Status: AC
  Administered 2013-11-27 (×4): 10 meq via INTRAVENOUS
  Filled 2013-11-27 (×3): qty 100

## 2013-11-27 MED ORDER — POTASSIUM CHLORIDE 10 MEQ/100ML IV SOLN
10.0000 meq | INTRAVENOUS | Status: AC
  Administered 2013-11-27 (×4): 10 meq via INTRAVENOUS
  Filled 2013-11-27 (×4): qty 100

## 2013-11-27 MED ORDER — MAGNESIUM SULFATE 40 MG/ML IJ SOLN
2.0000 g | Freq: Once | INTRAMUSCULAR | Status: AC
  Administered 2013-11-27: 2 g via INTRAVENOUS
  Filled 2013-11-27: qty 50

## 2013-11-27 MED ORDER — HYDRALAZINE HCL 20 MG/ML IJ SOLN
35.0000 mg | Freq: Four times a day (QID) | INTRAMUSCULAR | Status: DC
  Administered 2013-11-27 – 2013-11-28 (×4): 35 mg via INTRAVENOUS
  Filled 2013-11-27: qty 2
  Filled 2013-11-27: qty 1.75
  Filled 2013-11-27: qty 2
  Filled 2013-11-27 (×2): qty 1.75
  Filled 2013-11-27: qty 2
  Filled 2013-11-27: qty 1.75

## 2013-11-27 NOTE — Clinical Social Work Note (Signed)
[facility] and per telephone call with Bjorn LoserRhonda, [facility] and has been with them since 2009. CSW will follow-up with patient/family to confirm discharge plans when medically stable.  Genelle BalVanessa Haruto Demaria, MSW, LCSW [phone]

## 2013-11-27 NOTE — Progress Notes (Signed)
[facility] TEAM 1 - Stepdown/ICU TEAM Progress Note  Zenda Herskowitz FBX:[id number] DOB: 29-Dec-1945 DOA: 11/20/2013 PCP: Margit Hanks, MD  Admit HPI / Brief Narrative: 68 y.o. BF PMHx Depression, hemiplegia and hemiparesis, Hx CVA, HTN, CVA x5, chronic pain (AKA), IDDM, Seizure d/o. Presenting with encephalopathy, sepsis. Pt is a resident at local SNF. Per report, pt was unresponsive earlier today despite vigorous arousal. Family reports that pt has an overall fairly low functional level at baseline. Family is unaware of any recent falls, infections, episodes of nausea, vomiting, diarrhea, increased urinary frequency. Blood sugar was low on EMS arrival. Was given d50 with blood sugars increasing into 200s. There was reported multiple episodes of vomiting in transport to ER.  On arrival to ER, temp 97.6, HR 70s-100s, BP 150s-230s systolic, Satting >95% on non rebreather. WBC 16.7, Hgb 12.5, K 3.5, Cr 2.8 (baselin 1.9), Glu 182. Lactate 2.3. Trop WNL. UA pending. Head CT with no acute findings. CXR with chronic lung changes with superimposed edema without infiltrate. Pt with fentanyl patch in place on arrival. This was removed. Pt was given small dose of narcan with subsequent improvement in mentation, albeit transient. I have asked EDP to start pt on narcan gtt.      HPI/Subjective: 7/31 patient alert nodes her head yes and no to simple questions. Not just to question of does she understand what his PEG tube is. Also nodes yes to question of does she want a PEG tube placed. Negative CP/SOB   Assessment/Plan: NSTEMI: -Suffered a bradycardic arrest in the setting of respiratory distress. Her troponin is increased most likely related to demand ischemia superimposed on underlying coronary disease.  -Per Dr. Jens Som not a candidate for aggressive cardiac measures. -As nitroglycerin drip has been decreased patient's BP is increasing into the high 170s-180s again. -Continue medical therapy;  aspirin.  -Increase metoprolol IV 10 mg q4 hrs  -Increase Hydralazine 35 mg q 6hr -Continue Clonidine patch to 0.6 mg  -Continue labetalol PRN -Will add statin if/when patient passes swallow evaluation   Diastolic CHF -See NSTEMI -See HTN  Hypertension:  -Increase metoprolol IV 10 mg q4 hrs  -Increase Hydralazine 35 mg q 6hr -Increase clonidine patch to 0. 6 mg  -Continue to titrate nitroglycerin down, will need to increase above medications to assure adequate control is maintained.    Acute on chronic stage III renal failure: - Stable, follow renal function.  -Control BP  Metabolic Encephalopathy:  -Patient back to baseline able to nod yes and no to simple questions    Aspiration PNA: -Patient high-risk of aspiration, continue antibiotics for 7 day course  -Continue patient n.p.o.; patient failed swallow study on 7/28  -Failed modified barium swallow 7/30  Hypernatremia -Start D5W 141ml/hr  Hypokalemia -Potassium IV 10 mEq x4 runs  Hypomagnesemia  -Magnesium IV 2 gm x1  Abdominal pain -Resolved per patient -Continue NG tube for decompression.  Leukocytosis -Continued leukocytosis, with daily fever. Endocarditis? -RN Eber Jones to arrange family meeting to discuss short-term goals of care, long-term goals of care. At meeting will address need for TEE. -Will also address placement of PEG tube -Obtain CBC with differential to determine if patient has true infection i.e. bands and left shift prior to proceeding with above plan. -If patient's CBC shows true infection will consult infectious disease since patient has been on multiple antibiotics without resolution of fever and leukocytosis   Code Status: FULL Family Communication: no family present at time of exam Disposition Plan: Return to [facility]  Consultants: Dr. Noel Christmas (neurology) Dr. Marca Ancona (cardiology) Dr. Rory Percy [facility] M.)   Procedure/Significant Events: 7/25 EEG; abnormal  with moderate generalized continuous nonspecific slowing of cerebral activity. This pattern of slowing can be seen with a wide variety of encephalopathies, including metabolic and toxic encephalopathies, as well as degenerative CNS disorders. No evidence of an epileptic disorder  7/26 echocardiogram - Left ventricle: moderate LVH. LVEF= 65%-to 70%. - (grade 1 diastolic dysfunction). 7/26 PCXR Improved pulmonary aeration at the LEFT lung base, with mild residual airspace disease or atelectasis at the LEFT base. 7/27 modified barium swallow Severe oral phase dysphagia;Mild pharyngeal phase dysphagia;Moderate pharyngeal phase dysphagia -Pt.'s swallow ability is unsafe and non functional at present time  7/27 portable abdominal x-ray;Weighted feeding tube terminates in the proximal duodenum 7/29 abdominal series 2 view; Moderate air in the stomach and colon without evidence of obstruction. 7/30 CT Abd/Pelvis wo Contrast; Age advanced coronary artery atherosclerosis. - No abscess or bowel obstruction.  - Air within the urinary bladder urinary tract infection?  -Nasogastric tube in the stomach.        Culture Blood cultures x 2 7/24, NGTD  Urine culture 7/24 NGTD  Resp Viral Panel (ordered 7/25)  Sputum Culture (7/25)  -Respiratory Syncytial Virus A negative -Respiratory Syncytial Virus B negative -Influenza A. and B. Negative -Parainfluenza 1/2/3 negative -Metapneumovirus negative -Rhinovirus negative -Adenovirus negative Influenza A. H1 negative Influenza AH3 negative 7/29 blood right/left hand NGTD 7/29 urine negative    Antibiotics: Zosyn 7/24>> - stopped 7/29 Vanc 7/24>> -stopped 7/29 Levofloxacin 7/29>>   DVT prophylaxis: Heparin subcutaneous   Devices NA   LINES / TUBES:  7/24 20ga right forearm 7/27 22ga right hand     Continuous Infusions: . nitroGLYCERIN 10 mcg/min (11/26/13 0555)    Objective: VITAL SIGNS: Temp: 97.3 F (36.3 C) (07/31  0849) Temp src: Axillary (07/31 0849) BP: 178/57 mmHg (07/31 0849) Pulse Rate: 95 (07/31 0849) SPO2; 97% on room air FIO2:   Intake/Output Summary (Last 24 hours) at 11/27/13 0925 Last data filed at 11/27/13 0600  Gross per 24 hour  Intake    136 ml  Output    250 ml  Net   -114 ml     Exam: General: A./O., response to simple questions with nods of her head yes or no, No acute respiratory distress Lungs: Diffuse rhonchi  Lt > Rt , negative wheezes or crackles Cardiovascular: Regular rate and rhythm without murmur gallop or rub normal S1 and S2 Abdomen: Nontender, nondistended, soft, bowel sounds positive, no rebound, no ascites, no appreciable mass Extremities: No significant cyanosis, clubbing, or edema bilateral lower extremity  Data Reviewed: Basic Metabolic Panel:  Recent Labs Lab 11/20/13 2238  11/21/13 2055  11/24/13 0426 11/25/13 0320 11/25/13 2101 11/26/13 0401 11/27/13 0345  NA  --   < > 148*  < > 148* 151* 149* 149* 151*  K  --   < > 4.2  < > 3.3* 3.1* 3.0* 3.3* 2.8*  CL  --   < > 106  < > 112 111 113* 110 112  CO2  --   < > 17*  < > 23 23 19 20 23   GLUCOSE  --   < > 209*  < > 254* 146* 131* 178* 97  BUN  --   < > 41*  < > 55* 40* 38* 40* 46*  CREATININE  --   < > 2.34*  < > 2.31* 1.95* 1.85* 1.97* 1.98*  CALCIUM  --   < >  9.3  < > 8.8 8.8 8.8 8.7 8.6  MG 2.3  --  2.3  --   --   --   --  1.9  --   PHOS  --   --  5.4*  --   --   --   --   --   --   < > = values in this interval not displayed. Liver Function Tests:  Recent Labs Lab 11/24/13 0426 11/25/13 0320 11/25/13 2101 11/26/13 0401 11/27/13 0345  AST $Re'30 29 27 25 31  'mmN$ ALT $R'20 20 18 17 22  'Mj$ ALKPHOS 68 72 68 66 64  BILITOT <0.2* <0.2* 0.2* 0.2* 0.3  PROT 6.8 7.1 7.3 6.9 6.6  ALBUMIN 2.6* 2.7* 2.7* 2.7* 2.6*   No results found for this basename: LIPASE, AMYLASE,  in the last 168 hours  Recent Labs Lab 11/21/13 1000  AMMONIA 40   CBC:  Recent Labs Lab 11/22/13 0230 11/23/13 0244  11/24/13 0426 11/25/13 0320 11/25/13 2101 11/26/13 1401 11/27/13 0345  WBC 10.6* 11.2* 10.4 11.2* 10.2 14.1* 16.8*  NEUTROABS 8.1* 7.8* 7.2 7.2 7.2  --   --   HGB 11.0* 10.3* 9.9* 10.9* 12.9 11.8* 11.4*  HCT 35.4* 32.9* 31.9* 35.7* 42.0 38.2 36.3  MCV 87.6 87.3 89.1 88.8 89.0 90.1 89.4  PLT 278 257 236 278 256 269 250   Cardiac Enzymes:  Recent Labs Lab 11/21/13 1900 11/22/13 0230 11/22/13 0810 11/23/13 0244  TROPONINI 1.35* 3.77* 4.52* 2.84*   BNP (last 3 results)  Recent Labs  11/20/13 1705  PROBNP 832.1*   CBG:  Recent Labs Lab 11/26/13 1637 11/26/13 2105 11/26/13 2358 11/27/13 0353 11/27/13 0846  GLUCAP 165* 148* 153* 102* 123*    Recent Results (from the past 240 hour(s))  CULTURE, BLOOD (ROUTINE X 2)     Status: None   Collection Time    11/20/13  6:50 PM      Result Value Ref Range Status   Specimen Description BLOOD RIGHT ARM   Final   Special Requests BOTTLES DRAWN AEROBIC ONLY 10CC   Final   Culture  Setup Time     Final   Value: 11/21/2013 00:21     Performed at [facility]   Culture     Final   Value: NO GROWTH 5 DAYS     Performed at [facility]   Report Status 11/27/2013 FINAL   Final  CULTURE, BLOOD (ROUTINE X 2)     Status: None   Collection Time    11/20/13  6:55 PM      Result Value Ref Range Status   Specimen Description BLOOD RIGHT HAND   Final   Special Requests BOTTLES DRAWN AEROBIC ONLY 10CC   Final   Culture  Setup Time     Final   Value: 11/21/2013 00:21     Performed at [facility]   Culture     Final   Value: NO GROWTH 5 DAYS     Performed at [facility]   Report Status 11/27/2013 FINAL   Final  URINE CULTURE     Status: None   Collection Time    11/20/13  8:18 PM      Result Value Ref Range Status   Specimen Description URINE, CATHETERIZED   Final   Special Requests NONE   Final   Culture  Setup Time     Final   Value: 11/21/2013 01:18     Performed at [facility]    Colony Count     Final   Value: NO GROWTH     Performed at [facility]   Culture     Final   Value: NO GROWTH     Performed at [facility]   Report Status 11/22/2013 FINAL   Final  MRSA PCR SCREENING     Status: None   Collection Time    11/20/13  9:52 PM      Result Value Ref Range Status   MRSA by PCR NEGATIVE  NEGATIVE Final   Comment:            The GeneXpert MRSA Assay (FDA     approved for NASAL specimens     only), is one component of a     comprehensive MRSA colonization     surveillance program. It is not     intended to diagnose MRSA     infection nor to guide or     monitor treatment for     MRSA infections.  RESPIRATORY VIRUS PANEL     Status: None   Collection Time    11/22/13  7:58 PM      Result Value Ref Range Status   Source - RVPAN NASAL SWAB   Corrected   Comment: CORRECTED ON 07/27 AT 2040: PREVIOUSLY REPORTED AS NASAL SWAB   Respiratory Syncytial Virus A NOT DETECTED   Final   Respiratory Syncytial Virus B NOT DETECTED   Final   Influenza A NOT DETECTED   Final   Influenza B NOT DETECTED   Final   Parainfluenza 1 NOT DETECTED   Final   Parainfluenza 2 NOT DETECTED   Final   Parainfluenza 3 NOT DETECTED   Final   Metapneumovirus NOT DETECTED   Final   Rhinovirus NOT DETECTED   Final   Adenovirus NOT DETECTED   Final   Influenza A H1 NOT DETECTED   Final   Influenza A H3 NOT DETECTED   Final   Comment: (NOTE)           Normal Reference Range for each Analyte: NOT DETECTED     Testing performed using the Luminex xTAG Respiratory Viral Panel test     kit.     The analytical performance characteristics of this assay have been     determined by [facility].  The modifications have not been     cleared or approved by the FDA. This assay has been validated pursuant     to the CLIA regulations and is used for clinical purposes.     Performed at [place], BLOOD (ROUTINE X 2)     Status: None   Collection Time     11/25/13  9:01 PM      Result Value Ref Range Status   Specimen Description BLOOD RIGHT HAND   Final   Special Requests     Final   Value: BOTTLES DRAWN AEROBIC AND ANAEROBIC RED 4 CC BLUE 10 CC   Culture  Setup Time     Final   Value: 11/26/2013 00:36     Performed at [facility]   Culture     Final   Value:        BLOOD CULTURE RECEIVED NO GROWTH TO DATE CULTURE WILL BE HELD FOR 5 DAYS BEFORE ISSUING A FINAL NEGATIVE REPORT     Performed at [facility]   Report Status PENDING   Incomplete  CULTURE, BLOOD (ROUTINE X 2)     Status: None   Collection Time    11/25/13  9:08 PM      Result Value Ref Range Status   Specimen Description BLOOD LEFT HAND   Final   Special Requests BOTTLES DRAWN AEROBIC ONLY 5 CC   Final   Culture  Setup Time     Final   Value: 11/26/2013 00:36     Performed at [facility]   Culture     Final   Value:        BLOOD CULTURE RECEIVED NO GROWTH TO DATE CULTURE WILL BE HELD FOR 5 DAYS BEFORE ISSUING A FINAL NEGATIVE REPORT     Performed at [facility]   Report Status PENDING   Incomplete  URINE CULTURE     Status: None   Collection Time    11/25/13  9:30 PM      Result Value Ref Range Status   Specimen Description URINE, CATHETERIZED   Final   Special Requests NONE   Final   Culture  Setup Time     Final   Value: 11/25/2013 22:13     Performed at [facility] Count     Final   Value: NO GROWTH     Performed at [facility]   Culture     Final   Value: NO GROWTH     Performed at [facility]   Report Status 11/26/2013 FINAL   Final     Studies:  Recent x-ray studies have been reviewed in detail by the Attending Physician  Scheduled Meds:  Scheduled Meds: . antiseptic oral rinse  15 mL Mouth Rinse BID  . aspirin  300 mg Rectal Daily  . [START ON 12/01/2013] cloNIDine  0.6 mg Transdermal Weekly  . diazepam  2.5 mg Intravenous QHS  . heparin subcutaneous  5,000 Units  Subcutaneous 3 times per day  . hydrALAZINE  25 mg Intravenous Q6H  . insulin aspart  0-9 Units Subcutaneous 6 times per day  . insulin glargine  26 Units Subcutaneous Daily  . levofloxacin (LEVAQUIN) IV  750 mg Intravenous Q48H  . metoprolol  10 mg Intravenous 6 times per day  . oxyCODONE  5 mg Sublingual Q12H  . potassium chloride  10 mEq Intravenous Q1 Hr x 4  . pregabalin  25 mg Oral QHS    Time spent on care of this patient: 40 mins   Allie Bossier , MD   [facility] Office  [phone] Pager - [phone]  On-Call/Text Page:      [email or web address]      password [email or web address]  If 7PM-7AM, please contact night-coverage [email or web address] Password [email or web address] 11/27/2013, 9:25 AM   LOS: 7 days

## 2013-11-27 NOTE — Progress Notes (Signed)
Chaplain met with Dr Sherral Hammers to discuss present situation with patient and family on agreed upon plan of treatment and care. Mable Fill will follow up with family to ascertain their wishes concerning patient.  Willa Frater

## 2013-11-27 NOTE — Progress Notes (Signed)
crCRITICAL VALUE ALERT  Critical value received:  Potassium 2.8  Date of notification:  11/27/2013   Time of notification:  0450  Critical value read back: yes  Nurse who received alert:  Birdena CrandallAmanda Modestine Scherzinger RN  MD notified (1st page):   [place]  Time of first page: 5:06 AM   MD notified (2nd page):  Time of second page:  Responding MD:    Time MD responded: [phone]

## 2013-11-27 NOTE — Progress Notes (Signed)
ANTIBIOTIC CONSULT NOTE - Follow-up  Pharmacy Consult for levaquin Indication: Sepsis   No Known Allergies  Patient Measurements: Height: 5\' 3"  (160 cm) Weight: 169 lb 5 oz (76.8 kg) IBW/kg (Calculated) : 52.4  Vital Signs: Temp: 97.3 F (36.3 C) (07/31 0849) Temp src: Axillary (07/31 0849) BP: 155/52 mmHg (07/31 0942) Pulse Rate: 95 (07/31 0849) Intake/Output from previous day: 07/30 0701 - 07/31 0700 In: 136 [I.V.:36; IV Piggyback:100] Out: 250 [Emesis/NG output:250] Intake/Output from this shift:    Labs:  Recent Labs  11/25/13 2101 11/26/13 0401 11/26/13 1401 11/27/13 0345  WBC 10.2  --  14.1* 16.8*  HGB 12.9  --  11.8* 11.4*  PLT 256  --  269 250  CREATININE 1.85* 1.97*  --  1.98*   Estimated Creatinine Clearance: 27.8 ml/min (by C-G formula based on Cr of 1.98). No results found for this basename: VANCOTROUGH, VANCOPEAK, VANCORANDOM, GENTTROUGH, GENTPEAK, GENTRANDOM, TOBRATROUGH, TOBRAPEAK, TOBRARND, AMIKACINPEAK, AMIKACINTROU, AMIKACIN,  in the last 72 hours    Assessment: [age] YOF who presented to the ED with altered mental status. Pt continues on levaquin for aspiration PNA. WBC =16.8, tmax= 100.8, SCr 1.98, CrCl ~ 30 mL/min.  Noted for a 7 day antibiotic course.  7/24 vanc >> 7/29 7/24 zosyn >> 7/29 7/29 LQ>>  7/24 urine>>neg 7/24 blood x2 >> Neg 7/26 resp virus>>not detected 7/29 blood x2- ngtd 7/29 urine- neg  Plan:  -No levaquin changed needed -Consider d/c levaquin today?  Harland GermanAndrew Jabir Dahlem, Pharm D 11/27/2013 9:52 AM

## 2013-11-28 DIAGNOSIS — E1065 Type 1 diabetes mellitus with hyperglycemia: Secondary | ICD-10-CM

## 2013-11-28 DIAGNOSIS — I5032 Chronic diastolic (congestive) heart failure: Secondary | ICD-10-CM

## 2013-11-28 DIAGNOSIS — I509 Heart failure, unspecified: Secondary | ICD-10-CM

## 2013-11-28 DIAGNOSIS — IMO0002 Reserved for concepts with insufficient information to code with codable children: Secondary | ICD-10-CM

## 2013-11-28 DIAGNOSIS — E108 Type 1 diabetes mellitus with unspecified complications: Secondary | ICD-10-CM

## 2013-11-28 LAB — CBC WITH DIFFERENTIAL/PLATELET
BASOS PCT: 0 % (ref 0–1)
Basophils Absolute: 0 10*3/uL (ref 0.0–0.1)
EOS PCT: 1 % (ref 0–5)
Eosinophils Absolute: 0.1 10*3/uL (ref 0.0–0.7)
HEMATOCRIT: 33 % — AB (ref 36.0–46.0)
Hemoglobin: 10.2 g/dL — ABNORMAL LOW (ref 12.0–15.0)
Lymphocytes Relative: 21 % (ref 12–46)
Lymphs Abs: 2.7 10*3/uL (ref 0.7–4.0)
MCH: 27.9 pg (ref 26.0–34.0)
MCHC: 30.9 g/dL (ref 30.0–36.0)
MCV: 90.2 fL (ref 78.0–100.0)
MONO ABS: 1.2 10*3/uL — AB (ref 0.1–1.0)
Monocytes Relative: 9 % (ref 3–12)
Neutro Abs: 9.1 10*3/uL — ABNORMAL HIGH (ref 1.7–7.7)
Neutrophils Relative %: 69 % (ref 43–77)
Platelets: 237 10*3/uL (ref 150–400)
RBC: 3.66 MIL/uL — ABNORMAL LOW (ref 3.87–5.11)
RDW: 15.3 % (ref 11.5–15.5)
WBC: 13 10*3/uL — ABNORMAL HIGH (ref 4.0–10.5)

## 2013-11-28 LAB — COMPREHENSIVE METABOLIC PANEL
ALBUMIN: 2.3 g/dL — AB (ref 3.5–5.2)
ALK PHOS: 61 U/L (ref 39–117)
ALT: 18 U/L (ref 0–35)
AST: 29 U/L (ref 0–37)
Anion gap: 13 (ref 5–15)
BILIRUBIN TOTAL: 0.3 mg/dL (ref 0.3–1.2)
BUN: 48 mg/dL — ABNORMAL HIGH (ref 6–23)
CHLORIDE: 102 meq/L (ref 96–112)
CO2: 28 mEq/L (ref 19–32)
Calcium: 8 mg/dL — ABNORMAL LOW (ref 8.4–10.5)
Creatinine, Ser: 2.09 mg/dL — ABNORMAL HIGH (ref 0.50–1.10)
GFR calc Af Amer: 27 mL/min — ABNORMAL LOW (ref >90)
GFR calc non Af Amer: 24 mL/min — ABNORMAL LOW (ref >90)
Glucose, Bld: 239 mg/dL — ABNORMAL HIGH (ref 70–99)
Potassium: 3.2 mEq/L — ABNORMAL LOW (ref 3.7–5.3)
Sodium: 143 mEq/L (ref 137–147)
Total Protein: 5.8 g/dL — ABNORMAL LOW (ref 6.0–8.3)

## 2013-11-28 LAB — GLUCOSE, CAPILLARY
GLUCOSE-CAPILLARY: 170 mg/dL — AB (ref 70–99)
GLUCOSE-CAPILLARY: 231 mg/dL — AB (ref 70–99)
GLUCOSE-CAPILLARY: 262 mg/dL — AB (ref 70–99)
Glucose-Capillary: 239 mg/dL — ABNORMAL HIGH (ref 70–99)
Glucose-Capillary: 237 mg/dL — ABNORMAL HIGH (ref 70–99)
Glucose-Capillary: 94 mg/dL (ref 70–99)

## 2013-11-28 LAB — MAGNESIUM: Magnesium: 2.5 mg/dL (ref 1.5–2.5)

## 2013-11-28 MED ORDER — HYDRALAZINE HCL 20 MG/ML IJ SOLN
40.0000 mg | Freq: Four times a day (QID) | INTRAMUSCULAR | Status: DC
  Administered 2013-11-28 – 2013-11-30 (×7): 40 mg via INTRAVENOUS
  Filled 2013-11-28 (×11): qty 2

## 2013-11-28 MED ORDER — POTASSIUM CHLORIDE 10 MEQ/100ML IV SOLN
10.0000 meq | INTRAVENOUS | Status: AC
  Administered 2013-11-28 (×4): 10 meq via INTRAVENOUS
  Filled 2013-11-28 (×4): qty 100

## 2013-11-28 MED ORDER — INSULIN ASPART 100 UNIT/ML ~~LOC~~ SOLN
0.0000 [IU] | SUBCUTANEOUS | Status: DC
  Administered 2013-11-28: 3 [IU] via SUBCUTANEOUS
  Administered 2013-11-28: 5 [IU] via SUBCUTANEOUS
  Administered 2013-11-29: 2 [IU] via SUBCUTANEOUS
  Administered 2013-11-30: 3 [IU] via SUBCUTANEOUS
  Administered 2013-11-30: 5 [IU] via SUBCUTANEOUS
  Administered 2013-12-01: 3 [IU] via SUBCUTANEOUS
  Administered 2013-12-01: 2 [IU] via SUBCUTANEOUS
  Administered 2013-12-01: 3 [IU] via SUBCUTANEOUS
  Administered 2013-12-02: 5 [IU] via SUBCUTANEOUS
  Administered 2013-12-02 (×3): 3 [IU] via SUBCUTANEOUS

## 2013-11-28 MED ORDER — INSULIN GLARGINE 100 UNIT/ML ~~LOC~~ SOLN
30.0000 [IU] | Freq: Every day | SUBCUTANEOUS | Status: DC
  Administered 2013-11-29 – 2013-12-02 (×3): 30 [IU] via SUBCUTANEOUS
  Filled 2013-11-28 (×4): qty 0.3

## 2013-11-28 MED ORDER — INSULIN GLARGINE 100 UNIT/ML ~~LOC~~ SOLN: 32.0000 [IU] | Freq: Every day | SUBCUTANEOUS | Status: DC

## 2013-11-28 NOTE — Progress Notes (Signed)
[place] TEAM 1 - Stepdown/ICU TEAM Progress Note  Allison Washington MRN:[id number] DOB: 05/11/1945 DOA: 11/20/2013 PCP: ALEXANDER, ANNE D, MD  Admit HPI / Brief Narrative: 67 y.o. BF PMHx Depression, hemiplegia and hemiparesis, Hx CVA, HTN, CVA x5, chronic pain (AKA), IDDM, Seizure d/o. Presenting with encephalopathy, sepsis. Pt is a resident at local SNF. Per report, pt was unresponsive earlier today despite vigorous arousal. Family reports that pt has an overall fairly low functional level at baseline. Family is unaware of any recent falls, infections, episodes of nausea, vomiting, diarrhea, increased urinary frequency. Blood sugar was low on EMS arrival. Was given d50 with blood sugars increasing into 200s. There was reported multiple episodes of vomiting in transport to ER.  On arrival to ER, temp 97.6, HR 70s-100s, BP 150s-230s systolic, Satting >95% on non rebreather. WBC 16.7, Hgb 12.5, K 3.5, Cr 2.8 (baselin 1.9), Glu 182. Lactate 2.3. Trop WNL. UA pending. Head CT with no acute findings. CXR with chronic lung changes with superimposed edema without infiltrate. Pt with fentanyl patch in place on arrival. This was removed. Pt was given small dose of narcan with subsequent improvement in mentation, albeit transient. I have asked EDP to start pt on narcan gtt.      HPI/Subjective: 8/1 patient alert nodes her head yes and no to simple questions. Nodes that she understands PEG tube will not be placed until Monday    Assessment/Plan: NSTEMI: -Suffered a bradycardic arrest in the setting of respiratory distress. Her troponin is increased most likely related to demand ischemia superimposed on underlying coronary disease.  -Per Dr. Crenshaw not a candidate for aggressive cardiac measures. -Nitroglycerin drip off  -Continue medical therapy; aspirin.  -Increase metoprolol IV 10 mg q4 hrs  -Increase Hydralazine 40 mg q 6hr -Continue Clonidine patch to 0.6 mg  -Continue labetalol PRN -Will  add statin if/when patient passes swallow evaluation   Diastolic CHF -See NSTEMI -See HTN  Hypertension:  -Increase metoprolol IV 10 mg q4 hrs  -Increase Hydralazine 40 mg q 6hr -Continue clonidine patch to 0. 6 mg  -Nitroglycerin now off   Acute on chronic stage III renal failure: - Stable, follow renal function.  -Control BP  Metabolic Encephalopathy:  -Patient back to baseline able to nod yes and no to simple questions    Aspiration PNA: -Patient high-risk of aspiration, continue antibiotics for 7 day course  -Continue patient n.p.o.; patient failed swallow study on 7/28  -Failed modified barium swallow 7/30  Hypernatremia -Decrease D5W to 50ml/hr  Hypokalemia -Potassium IV 10 mEq x4 runs -Potassium goal> 4  Hypomagnesemia  -Resolved  -Magnesium goal > 2   Abdominal pain -Resolved per patient -Continue NG tube for decompression.  Leukocytosis -Continued leukocytosis, afebrile, negative bands/left shift  -Spoke with Dr. House (interventional radiology) and will obtain consent in the a.m. And plan for Monday placement of PEG/Gastric tube -DC all antibiotics, if patient spikes fever or leukocytosis trends up will panculture  Diabetes type 2 uncontrolled  -7/24 hemoglobin A1c= 9.5 -Increase Lantus to 30 units daily -Increase to moderate SSI -Decrease D5W to 50ml/hr     Code Status: FULL Family Communication: no family present at time of exam Disposition Plan: Return to [facility]    Consultants: Dr. Charles Stewart (neurology) Dr. Dalton McLean (cardiology) Dr. Daniel Feinstein (PCC M.) Dr. House/phone consult (interventional radiology)   Procedure/Significant Events: 7/25 EEG; abnormal with moderate generalized continuous nonspecific slowing of cerebral activity. This pattern of slowing can be seen with a wide variety   of encephalopathies, including metabolic and toxic encephalopathies, as well as degenerative CNS disorders. No evidence of an  epileptic disorder  7/26 echocardiogram - Left ventricle: moderate LVH. LVEF= 65%-to 70%. - (grade 1 diastolic dysfunction). 7/26 PCXR Improved pulmonary aeration at the LEFT lung base, with mild residual airspace disease or atelectasis at the LEFT base. 7/27 modified barium swallow Severe oral phase dysphagia;Mild pharyngeal phase dysphagia;Moderate pharyngeal phase dysphagia -Pt.'s swallow ability is unsafe and non functional at present time  7/27 portable abdominal x-ray;Weighted feeding tube terminates in the proximal duodenum 7/29 abdominal series 2 view; Moderate air in the stomach and colon without evidence of obstruction. 7/30 CT Abd/Pelvis wo Contrast; Age advanced coronary artery atherosclerosis. - No abscess or bowel obstruction.  - Air within the urinary bladder urinary tract infection?  -Nasogastric tube in the stomach.        Culture Blood cultures x 2 7/24, NGTD  Urine culture 7/24 NGTD  Resp Viral Panel (ordered 7/25)  Sputum Culture (7/25)  -Respiratory Syncytial Virus A negative -Respiratory Syncytial Virus B negative -Influenza A. and B. Negative -Parainfluenza 1/2/3 negative -Metapneumovirus negative -Rhinovirus negative -Adenovirus negative Influenza A. H1 negative Influenza AH3 negative 7/29 blood right/left hand NGTD 7/29 urine negative    Antibiotics: Zosyn 7/24>> - stopped 7/29 Vanc 7/24>> -stopped 7/29 Levofloxacin 7/29>> stopped 8/1   DVT prophylaxis: Heparin subcutaneous   Devices NA   LINES / TUBES:  7/24 20ga right forearm 7/27 22ga right hand     Continuous Infusions: . dextrose 50 mL/hr at 11/28/13 1226  . nitroGLYCERIN 10 mcg/min (11/28/13 1442)    Objective: VITAL SIGNS: Temp: 98 F (36.7 C) (08/01 1557) Temp src: Oral (08/01 1557) BP: 113/32 mmHg (08/01 1557) Pulse Rate: 67 (08/01 1557) SPO2; 97% on room air FIO2:   Intake/Output Summary (Last 24 hours) at 11/28/13 1751 Last data filed at 11/28/13 1600   Gross per 24 hour  Intake   3316 ml  Output   1900 ml  Net   1416 ml     Exam: General: A./O., response to simple questions with nods of her head yes or no, No acute respiratory distress Lungs: Diffuse rhonchi  Lt > Rt (improved from 7/31) , negative wheezes or crackles Cardiovascular: Regular rate and rhythm without murmur gallop or rub normal S1 and S2 Abdomen: Nontender, nondistended, soft, bowel sounds positive, no rebound, no ascites, no appreciable mass Extremities: No significant cyanosis, clubbing, or edema bilateral lower extremity  Data Reviewed: Basic Metabolic Panel:  Recent Labs Lab 11/21/13 2055  11/25/13 0320 11/25/13 2101 11/26/13 0401 11/27/13 0345 11/28/13 0240  NA 148*  < > 151* 149* 149* 151* 143  K 4.2  < > 3.1* 3.0* 3.3* 2.8* 3.2*  CL 106  < > 111 113* 110 112 102  CO2 17*  < > 23 19 20 23 28  GLUCOSE 209*  < > 146* 131* 178* 97 239*  BUN 41*  < > 40* 38* 40* 46* 48*  CREATININE 2.34*  < > 1.95* 1.85* 1.97* 1.98* 2.09*  CALCIUM 9.3  < > 8.8 8.8 8.7 8.6 8.0*  MG 2.3  --   --   --  1.9  --  2.5  PHOS 5.4*  --   --   --   --   --   --   < > = values in this interval not displayed. Liver Function Tests:  Recent Labs Lab 11/25/13 0320 11/25/13 2101 11/26/13 0401 11/27/13 0345 11/28/13 0240  AST   _0 ALT _1 ALKPHOS 72 68 66 64 61  BILITOT <0.2* 0.2* 0.2* 0.3 0.3  PROT 7.1 7.3 6.9 6.6 5.8*  ALBUMIN 2.7* 2.7* 2.7* 2.6* 2.3*   No results found for this basename: LIPASE, AMYLASE,  in the last 168 hours No results found for this basename: AMMONIA,  in the last 168 hours CBC:  Recent Labs Lab 11/24/13 0426 11/25/13 0320 11/25/13 2101 11/26/13 1401 11/27/13 0345 11/27/13 1130 11/28/13 0240  WBC 10.4 11.2* 10.2 14.1* 16.8* 16.3* 13.0*  NEUTROABS 7.2 7.2 7.2  --   --  12.0* 9.1*  HGB 9.9* 10.9* 12.9 11.8* 11.4* 11.8* 10.2*  HCT 31.9* 35.7* 42.0 38.2 36.3 37.8 33.0*  MCV 89.1 88.8 89.0 90.1 89.4 89.2 90.2  PLT 236 278  256 269 250 275 237   Cardiac Enzymes:  Recent Labs Lab 11/21/13 1900 11/22/13 0230 11/22/13 0810 11/23/13 0244  TROPONINI 1.35* 3.77* 4.52* 2.84*   BNP (last 3 results)  Recent Labs  11/20/13 1705  PROBNP 832.1*   CBG:  Recent Labs Lab 11/28/13 0019 11/28/13 0403 11/28/13 0746 11/28/13 1145 11/28/13 1555  GLUCAP 262* 231* 239* 237* 170*    Recent Results (from the past 240 hour(s))  CULTURE, BLOOD (ROUTINE X 2)     Status: None   Collection Time    11/20/13  6:50 PM      Result Value Ref Range Status   Specimen Description BLOOD RIGHT ARM   Final   Special Requests BOTTLES DRAWN AEROBIC ONLY 10CC   Final   Culture  Setup Time     Final   Value: 11/21/2013 00:21     Performed at [facility]   Culture     Final   Value: NO GROWTH 5 DAYS     Performed at [facility]   Report Status 11/27/2013 FINAL   Final  CULTURE, BLOOD (ROUTINE X 2)     Status: None   Collection Time    11/20/13  6:55 PM      Result Value Ref Range Status   Specimen Description BLOOD RIGHT HAND   Final   Special Requests BOTTLES DRAWN AEROBIC ONLY 10CC   Final   Culture  Setup Time     Final   Value: 11/21/2013 00:21     Performed at [facility]   Culture     Final   Value: NO GROWTH 5 DAYS     Performed at [facility]   Report Status 11/27/2013 FINAL   Final  URINE CULTURE     Status: None   Collection Time    11/20/13  8:18 PM      Result Value Ref Range Status   Specimen Description URINE, CATHETERIZED   Final   Special Requests NONE   Final   Culture  Setup Time     Final   Value: 11/21/2013 01:18     Performed at [place]     Final   Value: NO GROWTH     Performed at [facility]   Culture     Final   Value: NO GROWTH     Performed at [facility]   Report Status 11/22/2013 FINAL   Final  MRSA PCR SCREENING     Status: None   Collection Time    11/20/13  9:52 PM      Result Value Ref Range  Status   MRSA by PCR NEGATIVE  NEGATIVE Final   Comment:            The GeneXpert MRSA Assay (FDA     approved for NASAL specimens     only), is one component of a     comprehensive MRSA colonization     surveillance program. It is not     intended to diagnose MRSA     infection nor to guide or     monitor treatment for     MRSA infections.  RESPIRATORY VIRUS PANEL     Status: None   Collection Time    11/22/13  7:58 PM      Result Value Ref Range Status   Source - RVPAN NASAL SWAB   Corrected   Comment: CORRECTED ON 07/27 AT 2040: PREVIOUSLY REPORTED AS NASAL SWAB   Respiratory Syncytial Virus A NOT DETECTED   Final   Respiratory Syncytial Virus B NOT DETECTED   Final   Influenza A NOT DETECTED   Final   Influenza B NOT DETECTED   Final   Parainfluenza 1 NOT DETECTED   Final   Parainfluenza 2 NOT DETECTED   Final   Parainfluenza 3 NOT DETECTED   Final   Metapneumovirus NOT DETECTED   Final   Rhinovirus NOT DETECTED   Final   Adenovirus NOT DETECTED   Final   Influenza A H1 NOT DETECTED   Final   Influenza A H3 NOT DETECTED   Final   Comment: (NOTE)           Normal Reference Range for each Analyte: NOT DETECTED     Testing performed using the Luminex xTAG Respiratory Viral Panel test     kit.     The analytical performance characteristics of this assay have been     determined by [facility].  The modifications have not been     cleared or approved by the FDA. This assay has been validated pursuant     to the CLIA regulations and is used for clinical purposes.     Performed at [place], BLOOD (ROUTINE X 2)     Status: None   Collection Time    11/25/13  9:01 PM      Result Value Ref Range Status   Specimen Description BLOOD RIGHT HAND   Final   Special Requests     Final   Value: BOTTLES DRAWN AEROBIC AND ANAEROBIC RED 4 CC BLUE 10 CC   Culture  Setup Time     Final   Value: 11/26/2013 00:36     Performed at [facility]    Culture     Final   Value:        BLOOD CULTURE RECEIVED NO GROWTH TO DATE CULTURE WILL BE HELD FOR 5 DAYS BEFORE ISSUING A FINAL NEGATIVE REPORT     Performed at [facility]   Report Status PENDING   Incomplete  CULTURE, BLOOD (ROUTINE X 2)     Status: None   Collection Time    11/25/13  9:08 PM      Result Value Ref Range Status   Specimen Description BLOOD LEFT HAND   Final   Special Requests BOTTLES DRAWN AEROBIC ONLY 5 CC   Final   Culture  Setup Time     Final   Value: 11/26/2013 00:36     Performed at [facility]  Final   Value:        BLOOD CULTURE RECEIVED NO GROWTH TO DATE CULTURE WILL BE HELD FOR 5 DAYS BEFORE ISSUING A FINAL NEGATIVE REPORT     Performed at [facility]   Report Status PENDING   Incomplete  URINE CULTURE     Status: None   Collection Time    11/25/13  9:30 PM      Result Value Ref Range Status   Specimen Description URINE, CATHETERIZED   Final   Special Requests NONE   Final   Culture  Setup Time     Final   Value: 11/25/2013 22:13     Performed at [facility]     Final   Value: NO GROWTH     Performed at [facility]   Culture     Final   Value: NO GROWTH     Performed at [facility]   Report Status 11/26/2013 FINAL   Final     Studies:  Recent x-ray studies have been reviewed in detail by the Attending Physician  Scheduled Meds:  Scheduled Meds: . antiseptic oral rinse  15 mL Mouth Rinse BID  . aspirin  300 mg Rectal Daily  . [START ON 12/01/2013] cloNIDine  0.6 mg Transdermal Weekly  . diazepam  2.5 mg Intravenous QHS  . hydrALAZINE  40 mg Intravenous Q6H  . insulin aspart  0-15 Units Subcutaneous 6 times per day  . [START ON 11/29/2013] insulin glargine  30 Units Subcutaneous Daily  . metoprolol  10 mg Intravenous 6 times per day  . oxyCODONE  5 mg Sublingual Q12H  . potassium chloride  10 mEq Intravenous Q1 Hr x 4  . pregabalin  25 mg Oral QHS    Time  spent on care of this patient: 40 mins   Allie Bossier , MD   [facility] Office  [phone] Pager - [phone]  On-Call/Text Page:      [email or web address]      password [email or web address]  If 7PM-7AM, please contact night-coverage [email or web address] Password [email or web address] 11/28/2013, 5:51 PM   LOS: 8 days

## 2013-11-28 NOTE — Progress Notes (Signed)
Pt did not have any urine output by 0025, in spite of IVF running at 13000ml/hr.  Area of lower abdomen over bladder felt distended and taut to gentle palpation. Bladder scan showed 649ml urine in bladder.  Pt denied urge to void and/or need ability to use a bedpan.  Elray McgregorMary Lynch NP was notified of same and gave one time order for I&O cath.  The pt was informed and educated about the procedure and need for same.  By the time RN returned with supplies, pt had been incontinent of a large amount of urine.  Bladder scan repeated, showing 24ml remaining in bladder post incontinent void. Will continue to monitor.

## 2013-11-28 NOTE — Progress Notes (Signed)
IR PA aware of request for percutaneous gastrostomy tube placement for dysphagia. CT reviewed by Dr. Bonnielee HaffHoss and anatomy is amendable to percutaneous approach. I have spoke with the patient's RN today and reviewed the chart, patient is not able to provide consent and no clear POA is established, family (son) does not want G-tube at this time. IR will wait until POA is established and would like G-tube prior to any procedure.   Pattricia BossKoreen Zarinah Oviatt PA-C Interventional Radiology  11/28/13  8:49 AM

## 2013-11-29 ENCOUNTER — Encounter (HOSPITAL_COMMUNITY): Payer: Self-pay | Admitting: Radiology

## 2013-11-29 DIAGNOSIS — IMO0001 Reserved for inherently not codable concepts without codable children: Secondary | ICD-10-CM

## 2013-11-29 DIAGNOSIS — E1165 Type 2 diabetes mellitus with hyperglycemia: Secondary | ICD-10-CM

## 2013-11-29 LAB — CBC WITH DIFFERENTIAL/PLATELET
BASOS ABS: 0 10*3/uL (ref 0.0–0.1)
Basophils Relative: 0 % (ref 0–1)
Eosinophils Absolute: 0.3 10*3/uL (ref 0.0–0.7)
Eosinophils Relative: 2 % (ref 0–5)
HCT: 32.4 % — ABNORMAL LOW (ref 36.0–46.0)
Hemoglobin: 9.8 g/dL — ABNORMAL LOW (ref 12.0–15.0)
LYMPHS ABS: 3.5 10*3/uL (ref 0.7–4.0)
LYMPHS PCT: 27 % (ref 12–46)
MCH: 27.1 pg (ref 26.0–34.0)
MCHC: 30.2 g/dL (ref 30.0–36.0)
MCV: 89.5 fL (ref 78.0–100.0)
Monocytes Absolute: 1.1 10*3/uL — ABNORMAL HIGH (ref 0.1–1.0)
Monocytes Relative: 8 % (ref 3–12)
NEUTROS ABS: 7.9 10*3/uL — AB (ref 1.7–7.7)
NEUTROS PCT: 63 % (ref 43–77)
PLATELETS: 283 10*3/uL (ref 150–400)
RBC: 3.62 MIL/uL — AB (ref 3.87–5.11)
RDW: 15.5 % (ref 11.5–15.5)
WBC: 12.7 10*3/uL — AB (ref 4.0–10.5)

## 2013-11-29 LAB — TROPONIN I: Troponin I: <0.3 ng/mL (ref <0.30)

## 2013-11-29 LAB — URINALYSIS, ROUTINE W REFLEX MICROSCOPIC
BILIRUBIN URINE: NEGATIVE
Glucose, UA: NEGATIVE mg/dL
HGB URINE DIPSTICK: NEGATIVE
Ketones, ur: NEGATIVE mg/dL
Leukocytes, UA: NEGATIVE
NITRITE: NEGATIVE
Specific Gravity, Urine: 1.018 (ref 1.005–1.030)
UROBILINOGEN UA: 0.2 mg/dL (ref 0.0–1.0)
pH: 6 (ref 5.0–8.0)

## 2013-11-29 LAB — MAGNESIUM: Magnesium: 2.4 mg/dL (ref 1.5–2.5)

## 2013-11-29 LAB — COMPREHENSIVE METABOLIC PANEL
ALT: 15 U/L (ref 0–35)
ANION GAP: 13 (ref 5–15)
AST: 41 U/L — ABNORMAL HIGH (ref 0–37)
Albumin: 2.5 g/dL — ABNORMAL LOW (ref 3.5–5.2)
Alkaline Phosphatase: 54 U/L (ref 39–117)
BUN: 45 mg/dL — AB (ref 6–23)
CALCIUM: 7.9 mg/dL — AB (ref 8.4–10.5)
CO2: 33 meq/L — AB (ref 19–32)
CREATININE: 2.22 mg/dL — AB (ref 0.50–1.10)
Chloride: 97 mEq/L (ref 96–112)
GFR, EST AFRICAN AMERICAN: 26 mL/min — AB (ref >90)
GFR, EST NON AFRICAN AMERICAN: 22 mL/min — AB (ref >90)
Glucose, Bld: 92 mg/dL (ref 70–99)
Potassium: 4.1 mEq/L (ref 3.7–5.3)
Sodium: 143 mEq/L (ref 137–147)
Total Bilirubin: 0.3 mg/dL (ref 0.3–1.2)
Total Protein: 6 g/dL (ref 6.0–8.3)

## 2013-11-29 LAB — GLUCOSE, CAPILLARY
GLUCOSE-CAPILLARY: 108 mg/dL — AB (ref 70–99)
GLUCOSE-CAPILLARY: 84 mg/dL (ref 70–99)
GLUCOSE-CAPILLARY: 89 mg/dL (ref 70–99)
Glucose-Capillary: 102 mg/dL — ABNORMAL HIGH (ref 70–99)
Glucose-Capillary: 134 mg/dL — ABNORMAL HIGH (ref 70–99)

## 2013-11-29 LAB — URINE MICROSCOPIC-ADD ON

## 2013-11-29 LAB — PRO B NATRIURETIC PEPTIDE: Pro B Natriuretic peptide (BNP): 3701 pg/mL — ABNORMAL HIGH (ref 0–125)

## 2013-11-29 MED ORDER — CEFAZOLIN SODIUM-DEXTROSE 2-3 GM-% IV SOLR
2.0000 g | Freq: Once | INTRAVENOUS | Status: AC
  Filled 2013-11-29: qty 50

## 2013-11-29 MED ORDER — HEPARIN SODIUM (PORCINE) 5000 UNIT/ML IJ SOLN
5000.0000 [IU] | Freq: Three times a day (TID) | INTRAMUSCULAR | Status: DC
  Administered 2013-11-30 – 2013-12-02 (×7): 5000 [IU] via SUBCUTANEOUS
  Filled 2013-11-29 (×9): qty 1

## 2013-11-29 MED ORDER — HEPARIN SODIUM (PORCINE) 5000 UNIT/ML IJ SOLN
5000.0000 [IU] | Freq: Three times a day (TID) | INTRAMUSCULAR | Status: AC
  Administered 2013-11-29 (×2): 5000 [IU] via SUBCUTANEOUS

## 2013-11-29 NOTE — H&P (Signed)
Referring Physician: TRH HPI: Allison Washington is an 68 y.o. female with history of multiple CVA, dysphagia and aspiration who failed a barium swallow 7/30 and had aspiration PNA. IR received request for percutaneous gastrostomy tube placement. Her son is aware of procedure and has signed consent. The patient nods her head yes to having a gastrostomy tube. She nods her head no to CP or SOB.  Past Medical History:  Past Medical History  Diagnosis Date  . 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 Diabetes mellitus     Prone to hypoglycemia  . Aphagia     Status post numerous  . Dysphagia   . Peripheral vascular disease, unspecified   . Arterial disease   . Depressive disorder   . Hypothyroid   . Seizures   . MRSA infection     History of MRSA in the left leg wound and sacrum in the past-has been seen by vascular surgery and declined any type of intervention including arteriogram  . Diverticulosis     GI bleed in 2007-s/p endoscopy and colonoscopy in 2007 s/p massive GIB consittent with Diveerticulosis  . DVT (deep venous thrombosis)     Not on Coumadin  . Anemia     Chronic  . Non-smoker   . Stroke   . Hypertension   . History of DVT (deep vein thrombosis) 03/06/2011  . Nursing home acquired MRSA infection 03/06/2011    History of MRSA in the left leg wound and sacrum in the past-has been seen by vascular surgery and declined any type of intervention including arteriogram   . SEIZURE DISORDER 12/17/2006    Qualifier: Diagnosis of  By: Sarajane Jews MD, Ishmael Holter     Past Surgical History:  Past Surgical History  Procedure Laterality Date  . Above knee leg amputation    . Femoral-popliteal bypass graft      Family History: History reviewed. No pertinent family history.  Social History:  reports that she has never smoked. She does not have any smokeless  tobacco history on file. She reports that she does not drink alcohol or use illicit drugs.  Allergies: No Known Allergies  Medications:   Medication List    ASK your doctor about these medications       acetaminophen 325 MG tablet  Commonly known as:  TYLENOL  Take 650 mg by mouth every 6 (six) hours as needed for fever (pain).     amLODipine 10 MG tablet  Commonly known as:  NORVASC  Take 10 mg by mouth every morning.     aspirin 81 MG tablet  Take 324 mg by mouth every morning.     cloNIDine 0.2 mg/24hr patch  Commonly known as:  CATAPRES - Dosed in mg/24 hr  Place 1 patch onto the skin once a week.     feeding supplement (PRO-STAT SUGAR FREE 64) Liqd  Take 30 mLs by mouth every morning.     fentaNYL 50 MCG/HR  Commonly known as:  DURAGESIC - dosed mcg/hr  Place 1 patch (50 mcg total) onto the skin every 3 (three) days.     fish oil-omega-3 fatty acids 1000 MG capsule  Take 1 g by mouth 2 (two) times daily.     furosemide 40 MG tablet  Commonly known as:  LASIX  Take 40 mg by mouth every morning.  gemfibrozil 600 MG tablet  Commonly known as:  LOPID  Take 600 mg by mouth 2 (two) times daily before a meal.     insulin glargine 100 UNIT/ML injection  Commonly known as:  LANTUS  Inject 66 Units into the skin daily at 12 noon.     levothyroxine 88 MCG tablet  Commonly known as:  SYNTHROID, LEVOTHROID  Take 88 mcg by mouth daily. 4 pm     linagliptin 5 MG Tabs tablet  Commonly known as:  TRADJENTA  Take 5 mg by mouth every morning.     LORazepam 0.5 MG tablet  Commonly known as:  ATIVAN  Take 0.5 mg by mouth every 8 (eight) hours as needed for anxiety.     losartan 25 MG tablet  Commonly known as:  COZAAR  Take 25 mg by mouth 2 (two) times daily.     metoprolol tartrate 25 MG tablet  Commonly known as:  LOPRESSOR  Take 25 mg by mouth 2 (two) times daily.     MYRBETRIQ 50 MG Tb24 tablet  Generic drug:  mirabegron ER  Take 50 mg by mouth every  morning.     nitroGLYCERIN 0.4 MG SL tablet  Commonly known as:  NITROSTAT  Place 0.4 mg under the tongue every 5 (five) minutes x 3 doses as needed for chest pain. For chest pain     NOVOLOG FLEXPEN 100 UNIT/ML FlexPen  Generic drug:  insulin aspart  Inject 7-10 Units into the skin daily with supper. 10 units with breakfast and lunch, 7 units with supper, hold for CBG < 80     oxyCODONE 5 MG immediate release tablet  Commonly known as:  Oxy IR/ROXICODONE  Take two tablets by mouth every four hours as needed for pain     polyethylene glycol packet  Commonly known as:  MIRALAX / GLYCOLAX  Take 17 g by mouth every morning. Mix in 8 ounces of water; for constipation     pregabalin 50 MG capsule  Commonly known as:  LYRICA  Take 50 mg by mouth 3 (three) times daily.     rosuvastatin 40 MG tablet  Commonly known as:  CRESTOR  Take 40 mg by mouth at bedtime.     sennosides-docusate sodium 8.6-50 MG tablet  Commonly known as:  SENOKOT-S  Take 2 tablets by mouth every morning.     zolpidem 5 MG tablet  Commonly known as:  AMBIEN  Take 2.5 mg by mouth at bedtime as needed for sleep.       Unable to be obtained.  Physical Exam: BP 162/52  Pulse 65  Temp(Src) 97.7 F (36.5 C) (Oral)  Resp 14  Ht _0  (1.6 m)  Wt 178 lb 9.2 oz (81 kg)  BMI 31.64 kg/m2  SpO2 96% Body mass index is 31.64 kg/(m^2).  General Appearance:  Alert, cooperative, no distress, nods her head to questions.  Head:  Normocephalic, without obvious abnormality, atraumatic  Neck: Supple, symmetrical, trachea midline  Lungs:   CTA bilaterally.   Chest Wall:  No tenderness or deformity  Heart:  Regular rate and rhythm, S1, S2 normal, no murmur, rub or gallop.  Abdomen:   Soft, non-tender, distended, (+) BS  Extremities: Extremities with no cyanosis or edema  Neurologic: Nods head to questions.    Results for orders placed during the hospital encounter of 11/20/13 (from the past 48 hour(s))  CBC WITH  DIFFERENTIAL     Status: Abnormal   Collection Time  11/27/13 11:30 AM      Result Value Ref Range   WBC 16.3 (*) 4.0 - 10.5 K/uL   RBC 4.24  3.87 - 5.11 MIL/uL   Hemoglobin 11.8 (*) 12.0 - 15.0 g/dL   HCT 37.8  36.0 - 46.0 %   MCV 89.2  78.0 - 100.0 fL   MCH 27.8  26.0 - 34.0 pg   MCHC 31.2  30.0 - 36.0 g/dL   RDW 15.3  11.5 - 15.5 %   Platelets 275  150 - 400 K/uL   Neutrophils Relative % 74  43 - 77 %   Neutro Abs 12.0 (*) 1.7 - 7.7 K/uL   Lymphocytes Relative 19  12 - 46 %   Lymphs Abs 3.1  0.7 - 4.0 K/uL   Monocytes Relative 7  3 - 12 %   Monocytes Absolute 1.1 (*) 0.1 - 1.0 K/uL   Eosinophils Relative 0  0 - 5 %   Eosinophils Absolute 0.0  0.0 - 0.7 K/uL   Basophils Relative 0  0 - 1 %   Basophils Absolute 0.0  0.0 - 0.1 K/uL  GLUCOSE, CAPILLARY     Status: Abnormal   Collection Time    11/27/13 12:49 PM      Result Value Ref Range   Glucose-Capillary 122 (*) 70 - 99 mg/dL   Comment 1 Notify RN     Comment 2 Documented in Chart    GLUCOSE, CAPILLARY     Status: Abnormal   Collection Time    11/27/13  4:07 PM      Result Value Ref Range   Glucose-Capillary 190 (*) 70 - 99 mg/dL   Comment 1 Notify RN     Comment 2 Documented in Chart    GLUCOSE, CAPILLARY     Status: Abnormal   Collection Time    11/27/13  8:25 PM      Result Value Ref Range   Glucose-Capillary 229 (*) 70 - 99 mg/dL  GLUCOSE, CAPILLARY     Status: Abnormal   Collection Time    11/28/13 12:19 AM      Result Value Ref Range   Glucose-Capillary 262 (*) 70 - 99 mg/dL  COMPREHENSIVE METABOLIC PANEL     Status: Abnormal   Collection Time    11/28/13  2:40 AM      Result Value Ref Range   Sodium 143  137 - 147 mEq/L   Comment: DELTA CHECK NOTED   Potassium 3.2 (*) 3.7 - 5.3 mEq/L   Chloride 102  96 - 112 mEq/L   Comment: DELTA CHECK NOTED   CO2 28  19 - 32 mEq/L   Glucose, Bld 239 (*) 70 - 99 mg/dL   BUN 48 (*) 6 - 23 mg/dL   Creatinine, Ser 2.09 (*) 0.50 - 1.10 mg/dL   Calcium 8.0 (*) 8.4 -  10.5 mg/dL   Total Protein 5.8 (*) 6.0 - 8.3 g/dL   Albumin 2.3 (*) 3.5 - 5.2 g/dL   AST 29  0 - 37 U/L   ALT 18  0 - 35 U/L   Alkaline Phosphatase 61  39 - 117 U/L   Total Bilirubin 0.3  0.3 - 1.2 mg/dL   GFR calc non Af Amer 24 (*) >90 mL/min   GFR calc Af Amer 27 (*) >90 mL/min   Comment: (NOTE)     The eGFR has been calculated using the CKD EPI equation.     This calculation has not been  validated in all clinical situations.     eGFR's persistently <90 mL/min signify possible Chronic Kidney     Disease.   Anion gap 13  5 - 15  CBC WITH DIFFERENTIAL     Status: Abnormal   Collection Time    11/28/13  2:40 AM      Result Value Ref Range   WBC 13.0 (*) 4.0 - 10.5 K/uL   RBC 3.66 (*) 3.87 - 5.11 MIL/uL   Hemoglobin 10.2 (*) 12.0 - 15.0 g/dL   HCT 33.0 (*) 36.0 - 46.0 %   MCV 90.2  78.0 - 100.0 fL   MCH 27.9  26.0 - 34.0 pg   MCHC 30.9  30.0 - 36.0 g/dL   RDW 15.3  11.5 - 15.5 %   Platelets 237  150 - 400 K/uL   Neutrophils Relative % 69  43 - 77 %   Neutro Abs 9.1 (*) 1.7 - 7.7 K/uL   Lymphocytes Relative 21  12 - 46 %   Lymphs Abs 2.7  0.7 - 4.0 K/uL   Monocytes Relative 9  3 - 12 %   Monocytes Absolute 1.2 (*) 0.1 - 1.0 K/uL   Eosinophils Relative 1  0 - 5 %   Eosinophils Absolute 0.1  0.0 - 0.7 K/uL   Basophils Relative 0  0 - 1 %   Basophils Absolute 0.0  0.0 - 0.1 K/uL  MAGNESIUM     Status: None   Collection Time    11/28/13  2:40 AM      Result Value Ref Range   Magnesium 2.5  1.5 - 2.5 mg/dL  GLUCOSE, CAPILLARY     Status: Abnormal   Collection Time    11/28/13  4:03 AM      Result Value Ref Range   Glucose-Capillary 231 (*) 70 - 99 mg/dL  GLUCOSE, CAPILLARY     Status: Abnormal   Collection Time    11/28/13  7:46 AM      Result Value Ref Range   Glucose-Capillary 239 (*) 70 - 99 mg/dL  GLUCOSE, CAPILLARY     Status: Abnormal   Collection Time    11/28/13 11:45 AM      Result Value Ref Range   Glucose-Capillary 237 (*) 70 - 99 mg/dL  GLUCOSE,  CAPILLARY     Status: Abnormal   Collection Time    11/28/13  3:55 PM      Result Value Ref Range   Glucose-Capillary 170 (*) 70 - 99 mg/dL  GLUCOSE, CAPILLARY     Status: None   Collection Time    11/28/13  8:36 PM      Result Value Ref Range   Glucose-Capillary 94  70 - 99 mg/dL  GLUCOSE, CAPILLARY     Status: None   Collection Time    11/28/13 11:31 PM      Result Value Ref Range   Glucose-Capillary 89  70 - 99 mg/dL  COMPREHENSIVE METABOLIC PANEL     Status: Abnormal   Collection Time    11/29/13  2:41 AM      Result Value Ref Range   Sodium 143  137 - 147 mEq/L   Potassium 4.1  3.7 - 5.3 mEq/L   Comment: HEMOLYSIS AT THIS LEVEL MAY AFFECT RESULT   Chloride 97  96 - 112 mEq/L   CO2 33 (*) 19 - 32 mEq/L   Glucose, Bld 92  70 - 99 mg/dL   BUN 45 (*) 6 -  23 mg/dL   Creatinine, Ser 2.22 (*) 0.50 - 1.10 mg/dL   Calcium 7.9 (*) 8.4 - 10.5 mg/dL   Total Protein 6.0  6.0 - 8.3 g/dL   Albumin 2.5 (*) 3.5 - 5.2 g/dL   AST 41 (*) 0 - 37 U/L   Comment: HEMOLYSIS AT THIS LEVEL MAY AFFECT RESULT   ALT 15  0 - 35 U/L   Comment: HEMOLYSIS AT THIS LEVEL MAY AFFECT RESULT   Alkaline Phosphatase 54  39 - 117 U/L   Comment: HEMOLYSIS AT THIS LEVEL MAY AFFECT RESULT   Total Bilirubin 0.3  0.3 - 1.2 mg/dL   GFR calc non Af Amer 22 (*) >90 mL/min   GFR calc Af Amer 26 (*) >90 mL/min   Comment: (NOTE)     The eGFR has been calculated using the CKD EPI equation.     This calculation has not been validated in all clinical situations.     eGFR's persistently <90 mL/min signify possible Chronic Kidney     Disease.   Anion gap 13  5 - 15  CBC WITH DIFFERENTIAL     Status: Abnormal   Collection Time    11/29/13  2:41 AM      Result Value Ref Range   WBC 12.7 (*) 4.0 - 10.5 K/uL   RBC 3.62 (*) 3.87 - 5.11 MIL/uL   Hemoglobin 9.8 (*) 12.0 - 15.0 g/dL   HCT 32.4 (*) 36.0 - 46.0 %   MCV 89.5  78.0 - 100.0 fL   MCH 27.1  26.0 - 34.0 pg   MCHC 30.2  30.0 - 36.0 g/dL   RDW 15.5  11.5 - 15.5 %    Platelets 283  150 - 400 K/uL   Neutrophils Relative % 63  43 - 77 %   Neutro Abs 7.9 (*) 1.7 - 7.7 K/uL   Lymphocytes Relative 27  12 - 46 %   Lymphs Abs 3.5  0.7 - 4.0 K/uL   Monocytes Relative 8  3 - 12 %   Monocytes Absolute 1.1 (*) 0.1 - 1.0 K/uL   Eosinophils Relative 2  0 - 5 %   Eosinophils Absolute 0.3  0.0 - 0.7 K/uL   Basophils Relative 0  0 - 1 %   Basophils Absolute 0.0  0.0 - 0.1 K/uL  MAGNESIUM     Status: None   Collection Time    11/29/13  2:41 AM      Result Value Ref Range   Magnesium 2.4  1.5 - 2.5 mg/dL  GLUCOSE, CAPILLARY     Status: None   Collection Time    11/29/13  3:36 AM      Result Value Ref Range   Glucose-Capillary 84  70 - 99 mg/dL  TROPONIN I     Status: None   Collection Time    11/29/13  8:20 AM      Result Value Ref Range   Troponin I <0.30  <0.30 ng/mL   Comment:            Due to the release kinetics of cTnI,     a negative result within the first hours     of the onset of symptoms does not rule out     myocardial infarction with certainty.     If myocardial infarction is still suspected,     repeat the test at appropriate intervals.  PRO B NATRIURETIC PEPTIDE     Status: Abnormal   Collection Time  11/29/13  8:20 AM      Result Value Ref Range   Pro B Natriuretic peptide (BNP) 3701.0 (*) 0 - 125 pg/mL  GLUCOSE, CAPILLARY     Status: Abnormal   Collection Time    11/29/13  8:29 AM      Result Value Ref Range   Glucose-Capillary 102 (*) 70 - 99 mg/dL   No results found.  Assessment/Plan History of multiple CVA Dysphagia Hemiplegia  IDDM Seizure disorder HTN Diastolic CHF Metabolic encephalopathy.  NSTEMI secondary to bradycardic arrest in setting of respiratory failure, not a candidate for aggressive cardiac measures, asymptomatic, new ECG and labs, check troponins prior to sedation for G-tube. Acute on chronic renal failure.  Aspiration PNA, failed barium swallow 7/30 Request for image guided percutaneous gastrostomy  tube with moderate sedation. CT reviewed with Dr. Barbie Banner and anatomy amendable to percutaneous approach with no barium required. Labs reviewed, afebrile, ancef ordered. Risks and Benefits discussed with the patient and she nods her head yes in agreement to procedure, also her Son would like to have G-tube placed. All questions were answered, patient and her son are agreeable to proceed. Consent signed by son and in chart.   Tsosie Billing D PA-C 11/29/2013, 9:17 AM

## 2013-11-29 NOTE — Progress Notes (Signed)
[facility] TEAM 1 - Stepdown/ICU TEAM Progress Note  Allison Washington TGG:[id number] DOB: 1946-01-05 DOA: 11/20/2013 PCP: [name], MD  Admit HPI / Brief Narrative: 68 y.o. BF PMHx Depression, hemiplegia and hemiparesis, Hx CVA, HTN, CVA x5, chronic pain (AKA), IDDM, Seizure d/o. Presenting with encephalopathy, sepsis. Pt is a resident at local SNF. Per report, pt was unresponsive earlier today despite vigorous arousal. Family reports that pt has an overall fairly low functional level at baseline. Family is unaware of any recent falls, infections, episodes of nausea, vomiting, diarrhea, increased urinary frequency. Blood sugar was low on EMS arrival. Was given d50 with blood sugars increasing into 200s. There was reported multiple episodes of vomiting in transport to ER.  On arrival to ER, temp 97.6, HR 70s-100s, BP 703J-009F systolic, Satting >81% on non rebreather. WBC 16.7, Hgb 12.5, K 3.5, Cr 2.8 (baselin 1.9), Glu 182. Lactate 2.3. Trop WNL. UA pending. Head CT with no acute findings. CXR with chronic lung changes with superimposed edema without infiltrate. Pt with fentanyl patch in place on arrival. This was removed. Pt was given small dose of narcan with subsequent improvement in mentation, albeit transient. I have asked EDP to start pt on narcan gtt.      HPI/Subjective: 8/2 patient alert nodes her head yes and no to simple questions. Nodes not having  CP/SOB/abdominal pain.  Assessment/Plan: NSTEMI: -Suffered a bradycardic arrest in the setting of respiratory distress. Her troponin is increased most likely related to demand ischemia superimposed on underlying coronary disease.  -Per [name] not a candidate for aggressive cardiac measures. -Continue medical therapy; aspirin.  -Increase metoprolol IV 10 mg q4 hrs  -Increase Hydralazine 40 mg q 6hr -Continue Clonidine patch to 0.6 mg  -Continue labetalol PRN -Will add statin if/when patient passes swallow  evaluation -8./2 EKG obtained which when compared to EKG from 7/24 show the following changes;   1. in lead II, III T-wave now flat/normalized; flipped T-wave in lead V1, V2, V3; prolonged QT  interval  -Patient clinically asymptomatic.Changes most likely multifactorial; normal changes following NSTEMI, BP now under significantly improved control. However will obtain troponin x3, BNP and if elevated will consult cardiology.  Diastolic CHF -See NSTEMI -See HTN  Hypertension:  -Continue metoprolol IV 10 mg q4 hrs  -Continue Hydralazine 40 mg q 6hr -Continue clonidine patch to 0. 6 mg   Acute on chronic stage III renal failure: - Stable, follow renal function.  -Control BP  Metabolic Encephalopathy:  -Patient back to baseline able to nod yes and no to simple questions    Aspiration PNA: -Patient high-risk of aspiration, continue antibiotics for 7 day course  -Continue patient n.p.o.; patient failed swallow study on 7/28  -Failed modified barium swallow 7/30 -Interventional radiology to place PEG/gastric tube on 8/3  Hypernatremia -Continue D5W to 22ml/hr  Hypokalemia -At goal, continue to monitor  -Potassium goal> 4  Hypomagnesemia  -Resolved  -Magnesium goal > 2   Abdominal pain -Resolved per patient -Continue NG tube for decompression.  Leukocytosis -Continued leukocytosis (trending down), afebrile, negative bands/left shift  -Spoke with Dr. Edwinna Areola (interventional radiology) and will obtain consent in the a.m. And plan for Monday placement of PEG/Gastric tube -Continue to hold all antibiotics, if patient spikes fever or leukocytosis trends up will panculture  Diabetes type 2 uncontrolled  -7/24 hemoglobin A1c= 9.5 -Continue Lantus to 30 units daily -Continue moderate SSI -Continue D5W to 30ml/hr     Code Status: FULL Family Communication: no family present at  time of exam Disposition Plan: Return to [facility] SN[facility]    Consultants: [name]  (neurology) [name] (cardiology) [name] [facility] Dr. Lucky Rathke consult (interventional radiology)   Procedure/Significant Events: 7/25 EEG; abnormal with moderate generalized continuous nonspecific slowing of cerebral activity. This pattern of slowing can be seen with a wide variety of encephalopathies, including metabolic and toxic encephalopathies, as well as degenerative CNS disorders. No evidence of an epileptic disorder  7/26 echocardiogram - Left ventricle: moderate LVH. LVEF= 65%-to 70%. - (grade 1 diastolic dysfunction). 7/26 PCXR Improved pulmonary aeration at the LEFT lung base, with mild residual airspace disease or atelectasis at the LEFT base. 7/27 modified barium swallow Severe oral phase dysphagia;Mild pharyngeal phase dysphagia;Moderate pharyngeal phase dysphagia -Pt.'s swallow ability is unsafe and non functional at present time  7/27 portable abdominal x-ray;Weighted feeding tube terminates in the proximal duodenum 7/29 abdominal series 2 view; Moderate air in the stomach and colon without evidence of obstruction. 7/30 CT Abd/Pelvis wo Contrast; Age advanced coronary artery atherosclerosis. - No abscess or bowel obstruction.  - Air within the urinary bladder urinary tract infection?  -Nasogastric tube in the stomach.        Culture Blood cultures x 2 7/24, NGTD  Urine culture 7/24 NGTD  Resp Viral Panel (ordered 7/25)  Sputum Culture (7/25)  -Respiratory Syncytial Virus A negative -Respiratory Syncytial Virus B negative -Influenza A. and B. Negative -Parainfluenza 1/2/3 negative -Metapneumovirus negative -Rhinovirus negative -Adenovirus negative Influenza A. H1 negative Influenza AH3 negative 7/29 blood right/left hand NGTD 7/29 urine negative    Antibiotics: Zosyn 7/24>> - stopped 7/29 Vanc 7/24>> -stopped 7/29 Levofloxacin 7/29>> stopped 8/1   DVT prophylaxis: 8/2 Heparin subcutaneous, will hold at midnight and resume  after PEG placement   Devices NA   LINES / TUBES:  7/24 20ga right forearm 7/27 22ga right hand     Continuous Infusions: . dextrose 1,000 mL (11/28/13 2240)  . nitroGLYCERIN 10 mcg/min (11/28/13 1442)    Objective: VITAL SIGNS: Temp: 97.9 F (36.6 C) (08/02 0334) Temp src: Oral (08/02 0334) BP: 162/52 mmHg (08/02 0820) Pulse Rate: 65 (08/02 0820) SPO2; 97% on room air FIO2:   Intake/Output Summary (Last 24 hours) at 11/29/13 0846 Last data filed at 11/29/13 0700  Gross per 24 hour  Intake 3301.67 ml  Output   1075 ml  Net 2226.67 ml     Exam: General: A./O., response to simple questions with nods of her head yes or no, smiles and jokes with medical staff No acute respiratory distress Lungs: Diffuse rhonchi  Lt > Rt, negative wheezes or crackles Cardiovascular: Regular rate and rhythm without murmur gallop or rub normal S1 and S2 Abdomen: Nontender, nondistended, soft, bowel sounds positive, no rebound, no ascites, no appreciable mass Extremities: No significant cyanosis, clubbing, or edema bilateral lower extremity  Data Reviewed: Basic Metabolic Panel:  Recent Labs Lab 11/25/13 2101 11/26/13 0401 11/27/13 0345 11/28/13 0240 11/29/13 0241  NA 149* 149* 151* 143 143  K 3.0* 3.3* 2.8* 3.2* 4.1  CL 113* 110 112 102 97  CO2 $Re'19 20 23 28 'KfB$ 33*  GLUCOSE 131* 178* 97 239* 92  BUN 38* 40* 46* 48* 45*  CREATININE 1.85* 1.97* 1.98* 2.09* 2.22*  CALCIUM 8.8 8.7 8.6 8.0* 7.9*  MG  --  1.9  --  2.5 2.4   Liver Function Tests:  Recent Labs Lab 11/25/13 2101 11/26/13 0401 11/27/13 0345 11/28/13 0240 11/29/13 0241  AST $Re'27 25 31 29 'ghB$ 41*  ALT '18 17 22 18 15  '$ ALKPHOS 68 66 64 61 54  BILITOT 0.2* 0.2* 0.3 0.3 0.3  PROT 7.3 6.9 6.6 5.8* 6.0  ALBUMIN 2.7* 2.7* 2.6* 2.3* 2.5*   No results found for this basename: LIPASE, AMYLASE,  in the last 168 hours No results found for this basename: AMMONIA,  in the last 168 hours CBC:  Recent Labs Lab 11/25/13 0320  11/25/13 2101 11/26/13 1401 11/27/13 0345 11/27/13 1130 11/28/13 0240 11/29/13 0241  WBC 11.2* 10.2 14.1* 16.8* 16.3* 13.0* 12.7*  NEUTROABS 7.2 7.2  --   --  12.0* 9.1* 7.9*  HGB 10.9* 12.9 11.8* 11.4* 11.8* 10.2* 9.8*  HCT 35.7* 42.0 38.2 36.3 37.8 33.0* 32.4*  MCV 88.8 89.0 90.1 89.4 89.2 90.2 89.5  PLT 278 256 269 250 275 237 283   Cardiac Enzymes:  Recent Labs Lab 11/23/13 0244  TROPONINI 2.84*   BNP (last 3 results)  Recent Labs  11/20/13 1705  PROBNP 832.1*   CBG:  Recent Labs Lab 11/28/13 1555 11/28/13 2036 11/28/13 2331 11/29/13 0336 11/29/13 0829  GLUCAP 170* 94 89 84 102*    Recent Results (from the past 240 hour(s))  CULTURE, BLOOD (ROUTINE X 2)     Status: None   Collection Time    11/20/13  6:50 PM      Result Value Ref Range Status   Specimen Description BLOOD RIGHT ARM   Final   Special Requests BOTTLES DRAWN AEROBIC ONLY 10CC   Final   Culture  Setup Time     Final   Value: 11/21/2013 00:21     Performed at [facility]   Culture     Final   Value: NO GROWTH 5 DAYS     Performed at [facility]   Report Status 11/27/2013 FINAL   Final  CULTURE, BLOOD (ROUTINE X 2)     Status: None   Collection Time    11/20/13  6:55 PM      Result Value Ref Range Status   Specimen Description BLOOD RIGHT HAND   Final   Special Requests BOTTLES DRAWN AEROBIC ONLY 10CC   Final   Culture  Setup Time     Final   Value: 11/21/2013 00:21     Performed at [facility]   Culture     Final   Value: NO GROWTH 5 DAYS     Performed at [facility]   Report Status 11/27/2013 FINAL   Final  URINE CULTURE     Status: None   Collection Time    11/20/13  8:18 PM      Result Value Ref Range Status   Specimen Description URINE, CATHETERIZED   Final   Special Requests NONE   Final   Culture  Setup Time     Final   Value: 11/21/2013 01:18     Performed at [facility]     Final   Value: NO GROWTH      Performed at [facility]   Culture     Final   Value: NO GROWTH     Performed at [facility]   Report Status 11/22/2013 FINAL   Final  MRSA PCR SCREENING     Status: None   Collection Time    11/20/13  9:52 PM      Result Value Ref Range Status   MRSA by PCR NEGATIVE  NEGATIVE Final   Comment:  The GeneXpert MRSA Assay (FDA     approved for NASAL specimens     only), is one component of a     comprehensive MRSA colonization     surveillance program. It is not     intended to diagnose MRSA     infection nor to guide or     monitor treatment for     MRSA infections.  RESPIRATORY VIRUS PANEL     Status: None   Collection Time    11/22/13  7:58 PM      Result Value Ref Range Status   Source - RVPAN NASAL SWAB   Corrected   Comment: CORRECTED ON 07/27 AT 2040: PREVIOUSLY REPORTED AS NASAL SWAB   Respiratory Syncytial Virus A NOT DETECTED   Final   Respiratory Syncytial Virus B NOT DETECTED   Final   Influenza A NOT DETECTED   Final   Influenza B NOT DETECTED   Final   Parainfluenza 1 NOT DETECTED   Final   Parainfluenza 2 NOT DETECTED   Final   Parainfluenza 3 NOT DETECTED   Final   Metapneumovirus NOT DETECTED   Final   Rhinovirus NOT DETECTED   Final   Adenovirus NOT DETECTED   Final   Influenza A H1 NOT DETECTED   Final   Influenza A H3 NOT DETECTED   Final   Comment: (NOTE)           Normal Reference Range for each Analyte: NOT DETECTED     Testing performed using the Luminex xTAG Respiratory Viral Panel test     kit.     The analytical performance characteristics of this assay have been     determined by [facility].  The modifications have not been     cleared or approved by the FDA. This assay has been validated pursuant     to the CLIA regulations and is used for clinical purposes.     Performed at [place], BLOOD (ROUTINE X 2)     Status: None   Collection Time    11/25/13  9:01 PM      Result Value Ref  Range Status   Specimen Description BLOOD RIGHT HAND   Final   Special Requests     Final   Value: BOTTLES DRAWN AEROBIC AND ANAEROBIC RED 4 CC BLUE 10 CC   Culture  Setup Time     Final   Value: 11/26/2013 00:36     Performed at [facility]   Culture     Final   Value:        BLOOD CULTURE RECEIVED NO GROWTH TO DATE CULTURE WILL BE HELD FOR 5 DAYS BEFORE ISSUING A FINAL NEGATIVE REPORT     Performed at [facility]   Report Status PENDING   Incomplete  CULTURE, BLOOD (ROUTINE X 2)     Status: None   Collection Time    11/25/13  9:08 PM      Result Value Ref Range Status   Specimen Description BLOOD LEFT HAND   Final   Special Requests BOTTLES DRAWN AEROBIC ONLY 5 CC   Final   Culture  Setup Time     Final   Value: 11/26/2013 00:36     Performed at [facility]   Culture     Final   Value:        BLOOD CULTURE RECEIVED NO GROWTH TO DATE CULTURE WILL BE HELD FOR  5 DAYS BEFORE ISSUING A FINAL NEGATIVE REPORT     Performed at [facility]   Report Status PENDING   Incomplete  URINE CULTURE     Status: None   Collection Time    11/25/13  9:30 PM      Result Value Ref Range Status   Specimen Description URINE, CATHETERIZED   Final   Special Requests NONE   Final   Culture  Setup Time     Final   Value: 11/25/2013 22:13     Performed at [facility]     Final   Value: NO GROWTH     Performed at [facility]   Culture     Final   Value: NO GROWTH     Performed at [facility]   Report Status 11/26/2013 FINAL   Final     Studies:  Recent x-ray studies have been reviewed in detail by the Attending Physician  Scheduled Meds:  Scheduled Meds: . antiseptic oral rinse  15 mL Mouth Rinse BID  . aspirin  300 mg Rectal Daily  . [START ON 11/30/2013]  ceFAZolin (ANCEF) IV  2 g Intravenous Once  . [START ON 12/01/2013] cloNIDine  0.6 mg Transdermal Weekly  . diazepam  2.5 mg Intravenous QHS  . hydrALAZINE  40 mg  Intravenous Q6H  . insulin aspart  0-15 Units Subcutaneous 6 times per day  . insulin glargine  30 Units Subcutaneous Daily  . metoprolol  10 mg Intravenous 6 times per day  . oxyCODONE  5 mg Sublingual Q12H  . pregabalin  25 mg Oral QHS    Time spent on care of this patient: 40 mins   Allie Bossier , MD   [facility] Office  [phone] Pager - [phone]  On-Call/Text Page:      [email or web address]      password [email or web address]  If 7PM-7AM, please contact night-coverage [email or web address] Password [email or web address] 11/29/2013, 8:46 AM   LOS: 9 days

## 2013-11-29 NOTE — Clinical Social Work Note (Addendum)
CSW spoke with daughter, Allison Washington, who states that she has been main caregiver for pt for 10+ years.  Daughter states that brother (pt son) has reportedly refusing certain medical treatments/tests for pt and daughter voiced frustration.  Pt does NOT have HCPOA; therefore both daughter and son have equal decision making rights for pt.  Medical staff, please contact both daughter and son prior to medical decisions.  [name], LCSWA [phone]-weekend  Clinical Social Work

## 2013-11-29 NOTE — Progress Notes (Signed)
Pt asymptomatic and without complaint. QTc prolongation noted on rhythm strips. EKG done. QTc prolongation and flipped T waves noted. MD Woods made aware. New orders to follow. Will continue to monitor. 

## 2013-11-29 NOTE — Progress Notes (Signed)
Sq heparin for VTE px: to be hold at midnight and resumed after PEG placement 8/3 Plan: heparin 5000 sq q8h, last dose 2200 tonight, then resume 8/3 at 1400 or later after PEG placed Herby AbrahamMichelle T. Stephen Baruch, Pharm.D. [phone] 11/29/2013 9:10 AM

## 2013-11-30 ENCOUNTER — Inpatient Hospital Stay (HOSPITAL_COMMUNITY): Payer: Medicare Other

## 2013-11-30 LAB — GLUCOSE, CAPILLARY
GLUCOSE-CAPILLARY: 112 mg/dL — AB (ref 70–99)
GLUCOSE-CAPILLARY: 216 mg/dL — AB (ref 70–99)
GLUCOSE-CAPILLARY: 155 mg/dL — AB (ref 70–99)
GLUCOSE-CAPILLARY: 113 mg/dL — AB (ref 70–99)
Glucose-Capillary: 66 mg/dL — ABNORMAL LOW (ref 70–99)
Glucose-Capillary: 84 mg/dL (ref 70–99)
Glucose-Capillary: 92 mg/dL (ref 70–99)
Glucose-Capillary: 151 mg/dL — ABNORMAL HIGH (ref 70–99)

## 2013-11-30 LAB — BLOOD GAS, ARTERIAL
Acid-Base Excess: 17.8 mmol/L — ABNORMAL HIGH (ref 0.0–2.0)
BICARBONATE: 42.6 meq/L — AB (ref 20.0–24.0)
Drawn by: 23588
O2 Content: 5 L/min
O2 Saturation: 95.4 %
PCO2 ART: 54 mmHg — AB (ref 35.0–45.0)
PH ART: 7.507 — AB (ref 7.350–7.450)
Patient temperature: 97.9
TCO2: 44.3 mmol/L (ref 0–100)
pO2, Arterial: 75.5 mmHg — ABNORMAL LOW (ref 80.0–100.0)

## 2013-11-30 LAB — CBC WITH DIFFERENTIAL/PLATELET
Basophils Absolute: 0 10*3/uL (ref 0.0–0.1)
Basophils Relative: 0 % (ref 0–1)
Eosinophils Absolute: 0.2 10*3/uL (ref 0.0–0.7)
Eosinophils Relative: 1 % (ref 0–5)
HEMATOCRIT: 31.6 % — AB (ref 36.0–46.0)
Hemoglobin: 9.5 g/dL — ABNORMAL LOW (ref 12.0–15.0)
LYMPHS PCT: 26 % (ref 12–46)
Lymphs Abs: 2.9 10*3/uL (ref 0.7–4.0)
MCH: 27.5 pg (ref 26.0–34.0)
MCHC: 30.1 g/dL (ref 30.0–36.0)
MCV: 91.3 fL (ref 78.0–100.0)
MONO ABS: 1.1 10*3/uL — AB (ref 0.1–1.0)
Monocytes Relative: 9 % (ref 3–12)
Neutro Abs: 7.2 10*3/uL (ref 1.7–7.7)
Neutrophils Relative %: 64 % (ref 43–77)
Platelets: 261 10*3/uL (ref 150–400)
RBC: 3.46 MIL/uL — AB (ref 3.87–5.11)
RDW: 15.5 % (ref 11.5–15.5)
WBC: 11.4 10*3/uL — AB (ref 4.0–10.5)

## 2013-11-30 LAB — COMPREHENSIVE METABOLIC PANEL
ALBUMIN: 2.5 g/dL — AB (ref 3.5–5.2)
ALT: 20 U/L (ref 0–35)
AST: 35 U/L (ref 0–37)
Alkaline Phosphatase: 58 U/L (ref 39–117)
Anion gap: 12 (ref 5–15)
BILIRUBIN TOTAL: 0.3 mg/dL (ref 0.3–1.2)
BUN: 46 mg/dL — ABNORMAL HIGH (ref 6–23)
CALCIUM: 8.1 mg/dL — AB (ref 8.4–10.5)
CHLORIDE: 93 meq/L — AB (ref 96–112)
CO2: 43 mEq/L (ref 19–32)
Creatinine, Ser: 2.37 mg/dL — ABNORMAL HIGH (ref 0.50–1.10)
GFR calc Af Amer: 24 mL/min — ABNORMAL LOW (ref >90)
GFR calc non Af Amer: 20 mL/min — ABNORMAL LOW (ref >90)
Glucose, Bld: 68 mg/dL — ABNORMAL LOW (ref 70–99)
Potassium: 2.9 mEq/L — CL (ref 3.7–5.3)
Sodium: 148 mEq/L — ABNORMAL HIGH (ref 137–147)
Total Protein: 5.7 g/dL — ABNORMAL LOW (ref 6.0–8.3)

## 2013-11-30 LAB — MAGNESIUM: Magnesium: 2.4 mg/dL (ref 1.5–2.5)

## 2013-11-30 MED ORDER — POTASSIUM CHLORIDE 10 MEQ/100ML IV SOLN
10.0000 meq | Freq: Once | INTRAVENOUS | Status: AC
  Administered 2013-11-30: 10 meq via INTRAVENOUS
  Filled 2013-11-30: qty 100

## 2013-11-30 MED ORDER — CEFAZOLIN SODIUM-DEXTROSE 2-3 GM-% IV SOLR
INTRAVENOUS | Status: AC
  Administered 2013-11-30: 09:00:00
  Filled 2013-11-30: qty 50

## 2013-11-30 MED ORDER — DEXTROSE 50 % IV SOLN
25.0000 mL | Freq: Once | INTRAVENOUS | Status: AC | PRN
  Administered 2013-11-30: 25 mL via INTRAVENOUS

## 2013-11-30 MED ORDER — POTASSIUM CHLORIDE 10 MEQ/100ML IV SOLN
10.0000 meq | INTRAVENOUS | Status: AC
  Administered 2013-11-30 (×2): 10 meq via INTRAVENOUS
  Filled 2013-11-30 (×3): qty 100

## 2013-11-30 MED ORDER — HYDRALAZINE HCL 20 MG/ML IJ SOLN
20.0000 mg | Freq: Four times a day (QID) | INTRAMUSCULAR | Status: DC
  Administered 2013-11-30 – 2013-12-02 (×7): 20 mg via INTRAVENOUS
  Filled 2013-11-30 (×9): qty 1

## 2013-11-30 MED ORDER — DEXTROSE 50 % IV SOLN
INTRAVENOUS | Status: AC
  Filled 2013-11-30: qty 50

## 2013-11-30 MED ORDER — GLUCAGON HCL RDNA (DIAGNOSTIC) 1 MG IJ SOLR
INTRAMUSCULAR | Status: AC
  Filled 2013-11-30: qty 1

## 2013-11-30 MED ORDER — FENTANYL CITRATE 0.05 MG/ML IJ SOLN
INTRAMUSCULAR | Status: AC
  Filled 2013-11-30: qty 2

## 2013-11-30 MED ORDER — POTASSIUM CHLORIDE 10 MEQ/100ML IV SOLN
10.0000 meq | INTRAVENOUS | Status: AC
  Administered 2013-11-30 (×3): 10 meq via INTRAVENOUS
  Filled 2013-11-30 (×3): qty 100

## 2013-11-30 MED ORDER — FENTANYL CITRATE 0.05 MG/ML IJ SOLN
INTRAMUSCULAR | Status: AC | PRN
  Administered 2013-11-30: 25 ug via INTRAVENOUS

## 2013-11-30 MED ORDER — MIDAZOLAM HCL 2 MG/2ML IJ SOLN
INTRAMUSCULAR | Status: AC
  Filled 2013-11-30: qty 2

## 2013-11-30 MED ORDER — MIDAZOLAM HCL 2 MG/2ML IJ SOLN
INTRAMUSCULAR | Status: AC | PRN
Start: 2013-11-30 — End: 2013-11-30
  Administered 2013-11-30: 0.5 mg via INTRAVENOUS

## 2013-11-30 MED ORDER — GLUCAGON HCL (RDNA) 1 MG IJ SOLR
INTRAMUSCULAR | Status: AC | PRN
  Administered 2013-11-30: 1 mg via INTRAVENOUS

## 2013-11-30 MED ORDER — IOHEXOL 300 MG/ML  SOLN
50.0000 mL | Freq: Once | INTRAMUSCULAR | Status: AC | PRN
  Administered 2013-11-30: 20 mL

## 2013-11-30 NOTE — Progress Notes (Signed)
ABG results reported to RN.   

## 2013-11-30 NOTE — Progress Notes (Signed)
[place] TEAM 1 - Stepdown/ICU TEAM Progress Note  Allison Washington DPO:[id number] DOB: February 11, 1946 DOA: 11/20/2013 PCP: [name], MD  Admit HPI / Brief Narrative: 68 y.o. F w/ Hx Depression, CVA x5, hemiplegia and hemiparesis, HTN, chronic pain (AKA), IDDM, & Seizure d/o who presented with encephalopathy. Pt is a resident at local SNF. Per report, pt was unresponsive despite vigorous arousal. Family reported pt has an overall fairly low functional level at baseline. Blood sugar was low on EMS arrival. Was given D50 with blood sugars increasing into 200s. There was reported multiple episodes of vomiting in transport to ER.   On arrival to ER, temp 97.6, HR 70s-100s, BP 431V-400Q systolic, Satting >67% on non rebreather. WBC 16.7, Hgb 12.5, K 3.5, Cr 2.8 (baselin 1.9), Glu 182. Lactate 2.3. Trop WNL. Head CT with no acute findings. CXR with chronic lung changes with superimposed edema without infiltrate. Pt with fentanyl patch in place on arrival. This was removed. Pt was given small dose of narcan with subsequent improvement in mentation.   HPI/Subjective: Pt is alert and interactive.  Denies any complaints.  Appears comfortable.    Assessment/Plan:  NSTEMI -Suffered a bradycardic arrest in the setting of respiratory distress  -troponin increase likely related to demand ischemia  -Per [name] not a candidate for aggressive cardiac measures  -Continue medical therapy  Diastolic CHF -See NSTEMI -See HTN  Severely uncontrolled Hypertension  -BP now well controlled - no change in tx plan today    Acute on chronic stage III renal failure: -crt table but not yet back to baseline - follow renal function - baseline crt appears to be ~6.1-9.5  Metabolic Encephalopathy:  -Patient back to baseline (able to nod yes and no to simple questions)    Aspiration PNA: -Patient high-risk of aspiration, completed antibiotics for 7 day course  -Continue patient n.p.o.; patient failed  swallow study on 7/28  -Failed modified barium swallow 7/30 -Interventional radiology placed PEG/gastric tube 8/3  Hypernatremia -Continue D5W - f/u in AM   Hypokalemia -replace further and follow   Hypomagnesemia  -Resolved   Abdominal pain -Resolved per patient  Diabetes type 2 uncontrolled  -7/24 hemoglobin A1c= 9.5 -no change in tx plan today   Code Status: FULL Family Communication: no family present at time of exam Disposition Plan: Return to [facility]  Consultants: [name] (neurology) [name] (cardiology) [name] [facility] Dr. Lucky Rathke consult (interventional radiology)  Procedure/Significant Events: 7/25 EEG; abnormal with moderate generalized continuous nonspecific slowing of cerebral activity. This pattern of slowing can be seen with a wide variety of encephalopathies, including metabolic and toxic encephalopathies, as well as degenerative CNS disorders. No evidence of an epileptic disorder  7/26 echocardiogram - Left ventricle: moderate LVH. LVEF= 65%-to 70%. - (grade 1 diastolic dysfunction). 7/26 PCXR Improved pulmonary aeration at the LEFT lung base, with mild residual airspace disease or atelectasis at the LEFT base. 7/27 modified barium swallow Severe oral phase dysphagia;Mild pharyngeal phase dysphagia;Moderate pharyngeal phase dysphagia -Pt.'s swallow ability is unsafe and non functional at present time  7/27 portable abdominal x-ray;Weighted feeding tube terminates in the proximal duodenum 7/29 abdominal series 2 view; Moderate air in the stomach and colon without evidence of obstruction. 7/30 CT Abd/Pelvis wo Contrast; Age advanced coronary artery atherosclerosis. - No abscess or bowel obstruction.  - Air within the urinary bladder urinary tract infection?  -Nasogastric tube in the stomach.  Antibiotics: Zosyn 7/24>> - stopped 7/29 Vanc 7/24>> -stopped 7/29  Levofloxacin 7/29>> stopped 8/1  DVT  prophylaxis: SQ heparin   Objective: VITAL SIGNS: Blood pressure 125/43, pulse 68, temperature 98.1 F (36.7 C), temperature source Oral, resp. rate 13, height _0  (1.6 m), weight 81 kg (178 lb 9.2 oz), SpO2 100.00%.  Intake/Output Summary (Last 24 hours) at 11/30/13 1729 Last data filed at 11/30/13 1153  Gross per 24 hour  Intake 1794.17 ml  Output   1575 ml  Net 219.17 ml   Exam: General: responds to simple questions with nods of her head yes or no - No acute respiratory distress Lungs: Diffuse rhonchi  Lt > Rt, negative wheezes or crackles Cardiovascular: Regular rate and rhythm without murmur gallop or rub  Abdomen: Nontender, nondistended, soft, bowel sounds positive, no rebound, no ascites, no appreciable mass - PEG insertion dressed and dry  Extremities: No significant cyanosis, clubbing, or edema bilateral lower extremity  Data Reviewed: Basic Metabolic Panel:  Recent Labs Lab 11/26/13 0401 11/27/13 0345 11/28/13 0240 11/29/13 0241 11/30/13 0220  NA 149* 151* 143 143 148*  K 3.3* 2.8* 3.2* 4.1 2.9*  CL 110 112 102 97 93*  CO2 _1 33* 43*  GLUCOSE 178* 97 239* 92 68*  BUN 40* 46* 48* 45* 46*  CREATININE 1.97* 1.98* 2.09* 2.22* 2.37*  CALCIUM 8.7 8.6 8.0* 7.9* 8.1*  MG 1.9  --  2.5 2.4 2.4   Liver Function Tests:  Recent Labs Lab 11/26/13 0401 11/27/13 0345 11/28/13 0240 11/29/13 0241 11/30/13 0220  AST _2 41* 35  ALT _3 ALKPHOS 66 64 61 54 58  BILITOT 0.2* 0.3 0.3 0.3 0.3  PROT 6.9 6.6 5.8* 6.0 5.7*  ALBUMIN 2.7* 2.6* 2.3* 2.5* 2.5*   No results found for this basename: LIPASE, AMYLASE,  in the last 168 hours No results found for this basename: AMMONIA,  in the last 168 hours CBC:  Recent Labs Lab 11/25/13 2101  11/27/13 0345 11/27/13 1130 11/28/13 0240 11/29/13 0241 11/30/13 0220  WBC 10.2  < > 16.8* 16.3* 13.0* 12.7* 11.4*  NEUTROABS 7.2  --   --  12.0* 9.1* 7.9* 7.2  HGB 12.9  < > 11.4* 11.8* 10.2* 9.8* 9.5*   HCT 42.0  < > 36.3 37.8 33.0* 32.4* 31.6*  MCV 89.0  < > 89.4 89.2 90.2 89.5 91.3  PLT 256  < > 250 275 237 283 261  < > = values in this interval not displayed.  Cardiac Enzymes:  Recent Labs Lab 11/29/13 0820 11/29/13 1414 11/29/13 1945  TROPONINI <0.30 <0.30 <0.30   CBG:  Recent Labs Lab 11/30/13 0412 11/30/13 0455 11/30/13 0724 11/30/13 1122 11/30/13 1616  GLUCAP 66* 151* 112* 216* 155*    Recent Results (from the past 240 hour(s))  CULTURE, BLOOD (ROUTINE X 2)     Status: None   Collection Time    11/20/13  6:50 PM      Result Value Ref Range Status   Specimen Description BLOOD RIGHT ARM   Final   Special Requests BOTTLES DRAWN AEROBIC ONLY 10CC   Final   Culture  Setup Time     Final   Value: 11/21/2013 00:21     Performed at [facility]   Culture     Final   Value: NO GROWTH 5 DAYS     Performed at [facility]   Report Status 11/27/2013 FINAL   Final  CULTURE, BLOOD (ROUTINE X 2)  Status: None   Collection Time    11/20/13  6:55 PM      Result Value Ref Range Status   Specimen Description BLOOD RIGHT HAND   Final   Special Requests BOTTLES DRAWN AEROBIC ONLY 10CC   Final   Culture  Setup Time     Final   Value: 11/21/2013 00:21     Performed at [facility]   Culture     Final   Value: NO GROWTH 5 DAYS     Performed at [facility]   Report Status 11/27/2013 FINAL   Final  URINE CULTURE     Status: None   Collection Time    11/20/13  8:18 PM      Result Value Ref Range Status   Specimen Description URINE, CATHETERIZED   Final   Special Requests NONE   Final   Culture  Setup Time     Final   Value: 11/21/2013 01:18     Performed at [facility]     Final   Value: NO GROWTH     Performed at [facility]   Culture     Final   Value: NO GROWTH     Performed at [facility]   Report Status 11/22/2013 FINAL   Final  MRSA PCR SCREENING     Status: None   Collection Time     11/20/13  9:52 PM      Result Value Ref Range Status   MRSA by PCR NEGATIVE  NEGATIVE Final   Comment:            The GeneXpert MRSA Assay (FDA     approved for NASAL specimens     only), is one component of a     comprehensive MRSA colonization     surveillance program. It is not     intended to diagnose MRSA     infection nor to guide or     monitor treatment for     MRSA infections.  RESPIRATORY VIRUS PANEL     Status: None   Collection Time    11/22/13  7:58 PM      Result Value Ref Range Status   Source - RVPAN NASAL SWAB   Corrected   Comment: CORRECTED ON 07/27 AT 2040: PREVIOUSLY REPORTED AS NASAL SWAB   Respiratory Syncytial Virus A NOT DETECTED   Final   Respiratory Syncytial Virus B NOT DETECTED   Final   Influenza A NOT DETECTED   Final   Influenza B NOT DETECTED   Final   Parainfluenza 1 NOT DETECTED   Final   Parainfluenza 2 NOT DETECTED   Final   Parainfluenza 3 NOT DETECTED   Final   Metapneumovirus NOT DETECTED   Final   Rhinovirus NOT DETECTED   Final   Adenovirus NOT DETECTED   Final   Influenza A H1 NOT DETECTED   Final   Influenza A H3 NOT DETECTED   Final   Comment: (NOTE)           Normal Reference Range for each Analyte: NOT DETECTED     Testing performed using the Luminex xTAG Respiratory Viral Panel test     kit.     The analytical performance characteristics of this assay have been     determined by [facility].  The modifications have not been     cleared or approved by the FDA. This assay has  been validated pursuant     to the CLIA regulations and is used for clinical purposes.     Performed at [place], BLOOD (ROUTINE X 2)     Status: None   Collection Time    11/25/13  9:01 PM      Result Value Ref Range Status   Specimen Description BLOOD RIGHT HAND   Final   Special Requests     Final   Value: BOTTLES DRAWN AEROBIC AND ANAEROBIC RED 4 CC BLUE 10 CC   Culture  Setup Time     Final   Value: 11/26/2013  00:36     Performed at [facility]   Culture     Final   Value:        BLOOD CULTURE RECEIVED NO GROWTH TO DATE CULTURE WILL BE HELD FOR 5 DAYS BEFORE ISSUING A FINAL NEGATIVE REPORT     Performed at [facility]   Report Status PENDING   Incomplete  CULTURE, BLOOD (ROUTINE X 2)     Status: None   Collection Time    11/25/13  9:08 PM      Result Value Ref Range Status   Specimen Description BLOOD LEFT HAND   Final   Special Requests BOTTLES DRAWN AEROBIC ONLY 5 CC   Final   Culture  Setup Time     Final   Value: 11/26/2013 00:36     Performed at [facility]   Culture     Final   Value:        BLOOD CULTURE RECEIVED NO GROWTH TO DATE CULTURE WILL BE HELD FOR 5 DAYS BEFORE ISSUING A FINAL NEGATIVE REPORT     Performed at [facility]   Report Status PENDING   Incomplete  URINE CULTURE     Status: None   Collection Time    11/25/13  9:30 PM      Result Value Ref Range Status   Specimen Description URINE, CATHETERIZED   Final   Special Requests NONE   Final   Culture  Setup Time     Final   Value: 11/25/2013 22:13     Performed at [facility]     Final   Value: NO GROWTH     Performed at [facility]   Culture     Final   Value: NO GROWTH     Performed at [facility]   Report Status 11/26/2013 FINAL   Final     Studies:  Recent x-ray studies have been reviewed in detail by the Attending Physician  Scheduled Meds:  Scheduled Meds: . antiseptic oral rinse  15 mL Mouth Rinse BID  . aspirin  300 mg Rectal Daily  . [START ON 12/01/2013] cloNIDine  0.6 mg Transdermal Weekly  . diazepam  2.5 mg Intravenous QHS  . heparin subcutaneous  5,000 Units Subcutaneous 3 times per day  . hydrALAZINE  40 mg Intravenous Q6H  . insulin aspart  0-15 Units Subcutaneous 6 times per day  . insulin glargine  30 Units Subcutaneous Daily  . metoprolol  10 mg Intravenous 6 times per day  . oxyCODONE  5 mg Sublingual Q12H   . pregabalin  25 mg Oral QHS    Time spent on care of this patient: 35 mins  Cherene Altes, MD [facility] For Consults/Admissions - Flow Manager - [phone] Office  [phone] Pager [phone]  On-Call/Text Page:  [email or web address]      password [email or web address]  11/30/2013, 5:29 PM   LOS: 10 days

## 2013-11-30 NOTE — Procedures (Signed)
Interventional Radiology Procedure Note  Procedure: Placement of percutaneous 24 F pull-through gastrostomy tube. Complications: None Recommendations: - NPO except for sips and chips remainder of today and overnight - Maintain G-tube to LWS until tomorrow morning  - May advance diet as tolerated and begin using tube tomorrow morning  Signed,  Sterling BigHeath K. Evi Mccomb, MD [facility]

## 2013-11-30 NOTE — Significant Event (Signed)
CRITICAL VALUE ALERT  Critical value received:  Potassium 2.9, CO2 43  Date of notification:  11/30/13  Time of notification: 0342  Critical value read back: yes  Nurse who received alert:  Jeanmarie PlantStormie Caden Fukushima RN  MD notified (1st page): Tama GanderKatherine Schorr NP  Time of first page:  [phone]  Responding MD:  Tama GanderKatherine Schorr  Time MD responded:  [phone]

## 2013-11-30 NOTE — Progress Notes (Signed)
SLP Cancellation Note  Patient Details Name: Amado Nashatricia Pommier MRN: [id number] DOB: 1946/03/28   Cancelled treatment:       Reason Eval/Treat Not Completed: Patient at procedure or test/unavailable. To have PEG placed today   Paityn Balsam, Riley NearingBonnie Caroline 11/30/2013, 9:02 AM

## 2013-11-30 NOTE — Progress Notes (Signed)
Inpatient Diabetes Program Recommendations  AACE/ADA: New Consensus Statement on Inpatient Glycemic Control (2013)  Target Ranges:  Prepandial:   less than 140 mg/dL      Peak postprandial:   less than 180 mg/dL (1-2 hours)      Critically ill patients:  140 - 180 mg/dL   Results for Allison Washington, Allison Washington (MRN [id number]) as of 11/30/2013 08:57  Ref. Range 11/29/2013 08:29 11/29/2013 12:49 11/29/2013 16:13 11/29/2013 20:13 11/29/2013 23:45 11/30/2013 04:12 11/30/2013 04:55 11/30/2013 07:24  Glucose-Capillary Latest Range: 70-99 mg/dL 782102 (H) 956134 (H) 213108 (H) 84 92 66 (L) 151 (H) 112 (H)   Current orders for Inpatient glycemic control: Lantus 30 units daily, Novolog 0-15 units Q4H  Inpatient Diabetes Program Recommendations Insulin - Basal: Please consider decreasing Lantus to 27 units daily.  Thanks, Orlando PennerMarie Carsen Machi, RN, MSN, CCRN Diabetes Coordinator Inpatient Diabetes Program [phone] (Team Pager) [phone] (AP office) [phone] [facility] office)

## 2013-11-30 NOTE — Progress Notes (Signed)
Pt oxygen saturation noted to be 83% on 3L nasal canula;oxygen increased to 6L [place] with oxygen saturations at 85; Pt placed on 100% non-rebreather and is arousable to voice, with a RASS -2; April White, RT, at bedside. Junious SilkAllison Ellis, NP, aware. Stat ABG ordered. 11/30/2013 11:44 AM Eileen Stanfordarter, Mariah Gerstenberger

## 2013-11-30 NOTE — Progress Notes (Signed)
Chaplain responded to spiritual care consult. Patient was awake and visiting with a friend. She was able to answer questions/community through nodding her head. Patient had no emotional/spiritual needs at this time. Chaplain provided emotional support and pastoral presence. Consulted with pt's RN and requested that chaplain services be offered when family is present. Please page for further support.   Maurene CapesHillary D Irusta [phone]

## 2013-12-01 DIAGNOSIS — E873 Alkalosis: Secondary | ICD-10-CM

## 2013-12-01 LAB — BLOOD GAS, ARTERIAL
ACID-BASE EXCESS: 21.7 mmol/L — AB (ref 0.0–2.0)
BICARBONATE: 46.7 meq/L — AB (ref 20.0–24.0)
DRAWN BY: 33100
O2 Content: 2 L/min
O2 Saturation: 97.5 %
PCO2 ART: 55.4 mmHg — AB (ref 35.0–45.0)
Patient temperature: 98.6
TCO2: 48.4 mmol/L (ref 0–100)
pH, Arterial: 7.535 — ABNORMAL HIGH (ref 7.350–7.450)
pO2, Arterial: 88.7 mmHg (ref 80.0–100.0)

## 2013-12-01 LAB — BASIC METABOLIC PANEL
ANION GAP: 14 (ref 5–15)
Anion gap: 12 (ref 5–15)
BUN: 41 mg/dL — ABNORMAL HIGH (ref 6–23)
BUN: 41 mg/dL — ABNORMAL HIGH (ref 6–23)
CALCIUM: 8.2 mg/dL — AB (ref 8.4–10.5)
CALCIUM: 8.3 mg/dL — AB (ref 8.4–10.5)
CO2: 42 mEq/L (ref 19–32)
CO2: 45 mEq/L (ref 19–32)
CREATININE: 2.18 mg/dL — AB (ref 0.50–1.10)
CREATININE: 2.49 mg/dL — AB (ref 0.50–1.10)
Chloride: 89 mEq/L — ABNORMAL LOW (ref 96–112)
Chloride: 86 mEq/L — ABNORMAL LOW (ref 96–112)
GFR calc Af Amer: 22 mL/min — ABNORMAL LOW (ref >90)
GFR calc non Af Amer: 23 mL/min — ABNORMAL LOW (ref >90)
GFR calc non Af Amer: 19 mL/min — ABNORMAL LOW (ref >90)
GFR, EST AFRICAN AMERICAN: 26 mL/min — AB (ref >90)
GLUCOSE: 123 mg/dL — AB (ref 70–99)
Glucose, Bld: 105 mg/dL — ABNORMAL HIGH (ref 70–99)
Potassium: 3 mEq/L — ABNORMAL LOW (ref 3.7–5.3)
Potassium: 3.4 mEq/L — ABNORMAL LOW (ref 3.7–5.3)
SODIUM: 145 meq/L (ref 137–147)
Sodium: 143 mEq/L (ref 137–147)

## 2013-12-01 LAB — CBC
HCT: 31.4 % — ABNORMAL LOW (ref 36.0–46.0)
Hemoglobin: 9.4 g/dL — ABNORMAL LOW (ref 12.0–15.0)
MCH: 27.9 pg (ref 26.0–34.0)
MCHC: 29.9 g/dL — AB (ref 30.0–36.0)
MCV: 93.2 fL (ref 78.0–100.0)
PLATELETS: 281 10*3/uL (ref 150–400)
RBC: 3.37 MIL/uL — ABNORMAL LOW (ref 3.87–5.11)
RDW: 15.5 % (ref 11.5–15.5)
WBC: 12.6 10*3/uL — ABNORMAL HIGH (ref 4.0–10.5)

## 2013-12-01 LAB — GLUCOSE, CAPILLARY
GLUCOSE-CAPILLARY: 187 mg/dL — AB (ref 70–99)
Glucose-Capillary: 105 mg/dL — ABNORMAL HIGH (ref 70–99)
Glucose-Capillary: 107 mg/dL — ABNORMAL HIGH (ref 70–99)
Glucose-Capillary: 115 mg/dL — ABNORMAL HIGH (ref 70–99)
Glucose-Capillary: 133 mg/dL — ABNORMAL HIGH (ref 70–99)
Glucose-Capillary: 163 mg/dL — ABNORMAL HIGH (ref 70–99)

## 2013-12-01 LAB — POTASSIUM: POTASSIUM: 3.9 meq/L (ref 3.7–5.3)

## 2013-12-01 LAB — MAGNESIUM: MAGNESIUM: 2.2 mg/dL (ref 1.5–2.5)

## 2013-12-01 MED ORDER — POTASSIUM CHLORIDE 20 MEQ/15ML (10%) PO LIQD
40.0000 meq | Freq: Once | ORAL | Status: AC
  Administered 2013-12-01: 40 meq
  Filled 2013-12-01: qty 30

## 2013-12-01 MED ORDER — MORPHINE SULFATE 2 MG/ML IJ SOLN
1.0000 mg | Freq: Once | INTRAMUSCULAR | Status: AC
  Administered 2013-12-01: 1 mg via INTRAVENOUS
  Filled 2013-12-01: qty 1

## 2013-12-01 MED ORDER — PRO-STAT SUGAR FREE PO LIQD
30.0000 mL | Freq: Every day | ORAL | Status: DC
  Administered 2013-12-01: 15:00:00
  Administered 2013-12-02: 30 mL
  Filled 2013-12-01 (×2): qty 30

## 2013-12-01 MED ORDER — GLUCERNA 1.2 CAL PO LIQD
1000.0000 mL | ORAL | Status: DC
  Administered 2013-12-01: 1000 mL
  Filled 2013-12-01 (×6): qty 1000

## 2013-12-01 MED ORDER — OXYCODONE HCL 20 MG/ML PO CONC
5.0000 mg | Freq: Three times a day (TID) | ORAL | Status: DC
  Administered 2013-12-01 – 2013-12-02 (×3): 5 mg
  Filled 2013-12-01 (×3): qty 1

## 2013-12-01 NOTE — Progress Notes (Signed)
CRITICAL VALUE ALERT  Critical value received: CO2 45  Date of notification:  12/01/13  Time of notification: 1940  Critical value read back: yes  Nurse who received alert:  Nickolas MadridLetitia Jacquelinne Speak, RN  MD notified: Dr. Valentina Lucks. Woods, MD  Time of first page:  Verbally notified MD while on unit

## 2013-12-01 NOTE — Progress Notes (Signed)
Orthopedic Tech Progress Note Patient Details:  Allison Nashatricia [place] 1945-06-26 [id number]  Ortho Devices Type of Ortho Device: Abdominal binder Ortho Device/Splint Interventions: Application   Shawnie PonsCammer, Karisha Marlin Carol 12/01/2013, 12:41 PM

## 2013-12-01 NOTE — Progress Notes (Signed)
Subjective: Patient s/p perc G-tube 8/3  Objective: Physical Exam: BP 139/93  Pulse 86  Temp(Src) 99.2 F (37.3 C) (Oral)  Resp 21  Ht 5\' 3"  (1.6 m)  Wt 178 lb 9.2 oz (81 kg)  BMI 31.64 kg/m2  SpO2 96%  General: NAD Abd: Soft, ND, NT, (+) HypoBS, perc G-tube with dark blood in tubing connected to medium suction cannister.   Labs: CBC  Recent Labs  11/30/13 0220 12/01/13 0304  WBC 11.4* 12.6*  HGB 9.5* 9.4*  HCT 31.6* 31.4*  PLT 261 281   BMET  Recent Labs  11/30/13 0220 12/01/13 0304  NA 148* 145  K 2.9* 3.0*  CL 93* 89*  CO2 43* 42*  GLUCOSE 68* 105*  BUN 46* 41*  CREATININE 2.37* 2.18*  CALCIUM 8.1* 8.2*   LFT  Recent Labs  11/30/13 0220  PROT 5.7*  ALBUMIN 2.5*  AST 35  ALT 20  ALKPHOS 58  BILITOT 0.3   PT/INR No results found for this basename: LABPROT, INR,  in the last 72 hours   Studies/Results: Ir Gastrostomy Tube Mod Sed  11/30/2013   CLINICAL DATA:  68 year old female with history of multiple cerebrovascular accidents, dysphagia and aspiration. Percutaneous gastrostomy tube placement is warranted for enteral nutrition to decrease aspiration risk.  EXAM: PERC PLACEMENT GASTROSTOMY  Date: 11/30/2013  PROCEDURE: 1. Fluoroscopically guided placement of percutaneous pull-through gastrostomy tube. Interventional Radiologist:  Sterling Big, MD  ANESTHESIA/SEDATION: Moderate (conscious) sedation was used. 0.5 mg Versed, 25 mcg Fentanyl were administered intravenously. The patient's vital signs were monitored continuously by radiology nursing throughout the procedure.  Sedation Time: 6 minutes  FLUOROSCOPY TIME:  1 min 54 seconds  CONTRAST:  20mL OMNIPAQUE IOHEXOL 300 MG/ML  SOLN  MEDICATIONS: Two Ancef was administered intravenously within 1 hour of skin incision. 1 mg glucagon administered intravenously prior to skin incision.  TECHNIQUE: Informed consent was obtained from the patient following explanation of the procedure, risks, benefits and  alternatives. The patient understands, agrees and consents for the procedure. All questions were addressed. A time out was performed.  Maximal barrier sterile technique utilized including caps, mask, sterile gowns, sterile gloves, large sterile drape, hand hygiene, and chlorhexadine skin prep.  An angled catheter was advanced over a wire under fluoroscopic guidance through the nose, down the esophagus and into the body of the stomach. The stomach was then insufflated with several 100 ml of air. Fluoroscopy confirmed location of the gastric bubble, as well as inferior displacement of the barium stained colon. Under direct fluoroscopic guidance, a single T-tack was placed, and the anterior gastric wall drawn up against the anterior abdominal wall. Percutaneous access was then obtained into the mid gastric body with an 18 gauge sheath needle. Aspiration of air, and injection of contrast material under fluoroscopy confirmed needle placement.  An Amplatz wire was advanced in the gastric body and the access needle exchanged for a 9-French vascular sheath. A snare device was advanced through the vascular sheath and an Amplatz wire advanced through the angled catheter. The Amplatz wire was successfully snared and this was pulled up through the esophagus and out the mouth. A 24-French Burnell Blanks MIC-PEG tube was then connected to the snare and pulled through the mouth, down the esophagus, into the stomach and out to the anterior abdominal wall. Hand injection of contrast material confirmed intragastric location. The T-tack retention suture was then cut. The pull through peg tube was then secured with the external bumper and capped.  The  patient will be observed for several hours with the newly placed tube on low wall suction to evaluate for any post procedure complication. The patient tolerated the procedure well, there is no immediate complication.  IMPRESSION: Successful placement of a 24 French pull through  gastrostomy tube.  Signed,  Sterling BigHeath K. McCullough, MD  [facility]  [facility]   Electronically Signed   By: Malachy MoanHeath  McCullough M.D.   On: 11/30/2013 11:31    Assessment/Plan: History of multiple CVA Dysphagia S/p 24 F pull through perc gastrostomy tube 8/3 recommended LWS, however upon exam patient was on medium suction overnight.  Dark blood in suction cannister, abdomen soft, no pain to palpation, d/w Dr. Lowella DandyHenn discontinue suction and may begin tube feeds, will follow CBC.    LOS: 11 days    Cloretta NedMORGAN, Trisha Morandi D PA-C 12/01/2013 8:42 AM

## 2013-12-01 NOTE — Progress Notes (Signed)
Palliative Medicine Team Progress Note  [name] is much more alert today, she continues to have cough which her son says is normal for her. She denies pain. She seems to be doing well with the symptom management meds restarted in much smaller doses from her SNF med list prior to admission. Would continue current regimen but change to per tube now that PEG has been placed.  1. Valium 5mg  scheduled Per tube QHS at SNF, and a 5mg  breakthrough dose q4 PRN 2. Lyrica 25mg  per tube QHS 3. Oxycodone 5mg  per tube TID scheduled at SNF with a 10mg  dose q4 PRN for breakthrough pain. 4. Continue transdermal clonidine  Ok for patient to have small sips of water and ice chips for comfort.  Patient is mostly back to her baseline. Her goals remain full scope treatment- her son and daughter have not been able to reach consensus. Patient is able to answer YES/NO to some questions but full capacity evaluation is difficult. Plan is for her to return to SNF, would recommend palliative consultation at SNF for ongoing goals of care and symptom management.  Palliative Medicine Team will sign off at this time. Please re-consult if patient's goals change or there are new symptom management needs. She remains very high risk for recurrent aspiration. Son and daughter refuse to meet in same room together (per son) so advance planning is extremely difficult.  Please call [phone] if we can be of further assistance in Ms. [name]'s care.  Time: 10:20-10:35 (15 minutes) Anderson MaltaElizabeth Chasiti Waddington, DO Palliative Medicine

## 2013-12-01 NOTE — Clinical Social Work Note (Signed)
[facility] with [facility] coordinator [facility] prior to coming to the hospital[facility]  [facility]. Family did not put a bed-hold on the patients bed.  [facility].  Merlyn LotJenna Holoman, LCSWA Clinical Social Worker [phone]

## 2013-12-01 NOTE — Progress Notes (Signed)
CRITICAL VALUE ALERT  Critical value received:  CO2 42  Date of notification:  12/01/13  Time of notification:  0542  Critical value read back: yes  Nurse who received alert: Nickolas MadridLetitia Roshard Rezabek, RN  MD notified (1st page):  M. Burnadette PeterLynch, NP  Time of first page: [phone]  No new orders given

## 2013-12-01 NOTE — Progress Notes (Signed)
SLP Cancellation Note  Patient Details Name: Allison Washington MRN: [id number] DOB: [birth year]   Cancelled treatment:       Reason Eval/Treat Not Completed: Other (comment). Given that pt now has a G-tube, is still a full code and has ongoing risk of aspiration with all POs, will await MD discussion with family prior to providing further attempts at PO intake. If family wishes pt to attempt POs for pleasure with known risk of aspiration, SLP will assist with best diet and feeding methods.    Natisha Trzcinski, Riley NearingBonnie Caroline 12/01/2013, 10:38 AM

## 2013-12-01 NOTE — Progress Notes (Signed)
NUTRITION CONSULT/FOLLOW UP  DOCUMENTATION CODES Per approved criteria  -Obesity Unspecified   INTERVENTION:  Initiate Glucerna 1.2 formula at 20 ml/hr and increase by 10 ml every 4 hours to goal rate of 60 ml/hr with Prostat liquid protein 30 ml daily via tube to provide 1828 kcals, 101 gm protein, 1159 ml of free water RD to follow for nutrition care plan  NUTRITION DIAGNOSIS: Inadequate oral intake related to non-functional swallow as evidenced by NPO status, ongoing  Goal: Pt to meet >/= 90% of their estimated nutrition needs, currently unmet   Monitor:  TF regimen & tolerance, PO diet advancement, weight, labs, I/O's  ASSESSMENT: 68 y.o. year old female with multiple medical problems including HTN, CVA, chronic pain, IDDM, seizure d/o presenting with encephalopathy, sepsis. Family reports that pt has an overall fairly low functional level at baseline. Family is unaware of any recent falls, infections, episodes of nausea, vomiting, diarrhea, increased urinary frequency. Blood sugar was low on EMS arrival. Was given d50 with blood sugars increasing into 200s. There was reported multiple episodes of vomiting in transport to ER.   7/28:  RD unable to obtain nutrition hx from patient.  Non-verbal.  Pt s/p MBSS 7/27 -- pt swallow ability unsafe and non-functional -- SLP recommending NPO status.    Palliative Care Team following.  Patient pulled Panda tube out this AM; refused replacement at this time.    8/4:  Patient s/p G-tube placement per IR 8/3.  Ready to use.  RD consulted for TF initiation & management.  Height: Ht Readings from Last 1 Encounters:  11/20/13 5\' 3"  (1.6 m)    Weight: Wt Readings from Last 1 Encounters:  12/01/13 178 lb 9.2 oz (81 kg)    BMI:  Body mass index is 31.64 kg/(m^2).  Estimated Nutritional Needs: Kcal: 1700-1900 Protein: 90-100 gm Fluid: 1.7-1.9 L  Skin: Stage II pressure ulcer to sacrum  Diet Order: NPO   Intake/Output Summary (Last  24 hours) at 12/01/13 1117 Last data filed at 12/01/13 0900  Gross per 24 hour  Intake 1217.09 ml  Output   2220 ml  Net -1002.91 ml    Labs:   Recent Labs Lab 11/28/13 0240 11/29/13 0241 11/30/13 0220 12/01/13 0304  NA 143 143 148* 145  K 3.2* 4.1 2.9* 3.0*  CL 102 97 93* 89*  CO2 28 33* 43* 42*  BUN 48* 45* 46* 41*  CREATININE 2.09* 2.22* 2.37* 2.18*  CALCIUM 8.0* 7.9* 8.1* 8.2*  MG 2.5 2.4 2.4  --   GLUCOSE 239* 92 68* 105*    CBG (last 3)   Recent Labs  12/01/13 0004 12/01/13 0418 12/01/13 0730  GLUCAP 107* 115* 133*    Scheduled Meds: . antiseptic oral rinse  15 mL Mouth Rinse BID  . aspirin  300 mg Rectal Daily  . cloNIDine  0.6 mg Transdermal Weekly  . diazepam  2.5 mg Intravenous QHS  . heparin subcutaneous  5,000 Units Subcutaneous 3 times per day  . hydrALAZINE  20 mg Intravenous Q6H  . insulin aspart  0-15 Units Subcutaneous 6 times per day  . insulin glargine  30 Units Subcutaneous Daily  . metoprolol  10 mg Intravenous 6 times per day  . oxyCODONE  5 mg Per Tube 3 times per day  . pregabalin  25 mg Oral QHS    Continuous Infusions: . dextrose 75 mL/hr at 11/30/13 2222  . nitroGLYCERIN 10 mcg/min (11/28/13 1442)    Past Medical History  Diagnosis Date  .  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 Diabetes mellitus     Prone to hypoglycemia  . Aphagia     Status post numerous  . Dysphagia   . Peripheral vascular disease, unspecified   . Arterial disease   . Depressive disorder   . Hypothyroid   . Seizures   . MRSA infection     History of MRSA in the left leg wound and sacrum in the past-has been seen by vascular surgery and declined any type of intervention including arteriogram  . Diverticulosis     GI bleed in 2007-s/p endoscopy and colonoscopy in 2007 s/p massive GIB consittent with  Diveerticulosis  . DVT (deep venous thrombosis)     Not on Coumadin  . Anemia     Chronic  . Non-smoker   . Stroke   . Hypertension   . History of DVT (deep vein thrombosis) 03/06/2011  . Nursing home acquired MRSA infection 03/06/2011    History of MRSA in the left leg wound and sacrum in the past-has been seen by vascular surgery and declined any type of intervention including arteriogram   . SEIZURE DISORDER 12/17/2006    Qualifier: Diagnosis of  By: Clent Ridges MD, Tera Mater     Past Surgical History  Procedure Laterality Date  . Above knee leg amputation    . Femoral-popliteal bypass graft      Maureen Chatters, RD, LDN Pager #: [phone] After-Hours Pager #: [phone]

## 2013-12-01 NOTE — Progress Notes (Signed)
[place] TEAM 1 - Stepdown/ICU TEAM Progress Note  Allison Washington ZOX:[id number] DOB: Nov 18, 1945 DOA: 11/20/2013 PCP: [name], MD  Admit HPI / Brief Narrative: 68 y.o. BF PMHx Depression, hemiplegia and hemiparesis, Hx CVA, HTN, CVA x5, chronic pain (AKA), IDDM, Seizure d/o. Presenting with encephalopathy, sepsis. Pt is a resident at local SNF. Per report, pt was unresponsive earlier today despite vigorous arousal. Family reports that pt has an overall fairly low functional level at baseline. Family is unaware of any recent falls, infections, episodes of nausea, vomiting, diarrhea, increased urinary frequency. Blood sugar was low on EMS arrival. Was given d50 with blood sugars increasing into 200s. There was reported multiple episodes of vomiting in transport to ER.  On arrival to ER, temp 97.6, HR 70s-100s, BP 811B-147W systolic, Satting >29% on non rebreather. WBC 16.7, Hgb 12.5, K 3.5, Cr 2.8 (baselin 1.9), Glu 182. Lactate 2.3. Trop WNL. UA pending. Head CT with no acute findings. CXR with chronic lung changes with superimposed edema without infiltrate. Pt with fentanyl patch in place on arrival. This was removed. Pt was given small dose of narcan with subsequent improvement in mentation, albeit transient. I have asked EDP to start pt on narcan gtt.      HPI/Subjective: 8/4 S/P. G-tube placement on 8/3. patient alert nodes her head yes and no to simple questions. Nodes she understands that nutrition will stop by today to initiate therapy States understanding Nodes not having  CP/SOB/abdominal pain.  Assessment/Plan: NSTEMI: -Suffered a bradycardic arrest in the setting of respiratory distress. Her troponin is increased most likely related to demand ischemia superimposed on underlying coronary disease.  -Per [name] not a candidate for aggressive cardiac measures. -Continue medical therapy; aspirin.  -Increase metoprolol IV 10 mg q4 hrs  -Increase Hydralazine 40 mg q  6hr -Continue Clonidine patch to 0.6 mg  -Continue labetalol PRN -Will add statin if/when patient passes swallow evaluation -8./2 EKG obtained which when compared to EKG from 7/24 show the following changes;   1. in lead II, III T-wave now flat/normalized; flipped T-wave in lead V1, V2, V3; prolonged QT  interval  -Patient clinically asymptomatic.Changes most likely multifactorial; normal changes following NSTEMI, BP now under significantly improved control. However will obtain troponin x3, BNP and if elevated will consult cardiology.  Diastolic CHF -See NSTEMI -See HTN  Hypertension:  -Continue metoprolol IV 10 mg q4 hrs  -Continue Hydralazine 40 mg q 6hr -Continue clonidine patch to 0. 6 mg   Acute on chronic stage III renal failure: - Stable, follow renal function.  -Control BP  Metabolic Encephalopathy:  -Patient back to baseline able to nod yes and no to simple questions   Mixed respiratory/metabolic alkalosis? -Patient asymptomatic sitting in bed communicating with myself and daughter appropriate therefore will only observe. -Consult PCCM/nephrology. in a.m. if continues.   Aspiration PNA: -Patient high-risk of aspiration, continue antibiotics for 7 day course  -Continue patient n.p.o.; patient failed swallow study on 7/28  -Failed modified barium swallow 7/30 -Interventional radiology to place PEG/gastric tube on 8/3  Hypernatremia -Continue D5W to 15m/hr  Hypokalemia -Potassium 40 mEq per G-tube -Recheck K. at 1500 -Potassium goal> 4  Hypomagnesemia  -Resolved  -Magnesium goal > 2   Abdominal pain -Resolved per patient -Continue NG tube for decompression.  G-tube feedings  -Consult placed for nutrition  Leukocytosis -Continued leukocytosis high, stable, afebrile -Continue to hold all antibiotics, if patient spikes fever or leukocytosis trends up will panculture  Diabetes type 2 uncontrolled  -  7/24 hemoglobin A1c= 9.5 -Continue Lantus to 30 units  daily -Continue moderate SSI -Continue D5W to 18m/hr  GOALS OF CARE -Patient appeared to be of sound mind and body and understood discussion today concerning of care power of attorney. Indicated that she wished for her daughter Allison Washington be her healthcare power of attorney -Been contacted by NCM to initiate paperwork    Code Status: FULL Family Communication: no family present at time of exam [facility]    Consultants: Dr. CWallie Char(neurology) Dr. DLoralie Champagne(cardiology) [facility] Dr. HLucky Rathkeconsult (interventional radiology) Dr. HJacqulynn Cadet(interventional radiology)    Procedure/Significant Events: 7/25 EEG; abnormal with moderate generalized continuous nonspecific slowing of cerebral activity. This pattern of slowing can be seen with a wide variety of encephalopathies, including metabolic and toxic encephalopathies, as well as degenerative CNS disorders. No evidence of an epileptic disorder  7/26 echocardiogram - Left ventricle: moderate LVH. LVEF= 65%-to 70%. - (grade 1 diastolic dysfunction). 7/26 PCXR Improved pulmonary aeration at the LEFT lung base, with mild residual airspace disease or atelectasis at the LEFT base. 7/27 modified barium swallow Severe oral phase dysphagia;Mild pharyngeal phase dysphagia;Moderate pharyngeal phase dysphagia -Pt.'s swallow ability is unsafe and non functional at present time  7/27 portable abdominal x-ray;Weighted feeding tube terminates in the proximal duodenum 7/29 abdominal series 2 view; Moderate air in the stomach and colon without evidence of obstruction. 7/30 CT Abd/Pelvis wo Contrast; Age advanced coronary artery atherosclerosis. - No abscess or bowel obstruction.  - Air within the urinary bladder urinary tract infection?  -Nasogastric tube in the stomach. 8/3  Placement of percutaneous 24 F pull-through gastrostomy  tube.        Culture Blood cultures x 2 7/24, NGTD  Urine culture 7/24 NGTD  Resp Viral Panel (ordered 7/25)  Sputum Culture (7/25)  -Respiratory Syncytial Virus A negative -Respiratory Syncytial Virus B negative -Influenza A. and B. Negative -Parainfluenza 1/2/3 negative -Metapneumovirus negative -Rhinovirus negative -Adenovirus negative Influenza A. H1 negative Influenza AH3 negative 7/29 blood right/left hand NGTD 7/29 urine negative    Antibiotics: Zosyn 7/24>> - stopped 7/29 Vanc 7/24>> -stopped 7/29 Levofloxacin 7/29>> stopped 8/1   DVT prophylaxis: Heparin subcutaneous   Devices NA   LINES / TUBES:  7/24 20ga right forearm 7/27 22ga right hand     Continuous Infusions: . dextrose 75 mL/hr at 11/30/13 2222  . nitroGLYCERIN 10 mcg/min (11/28/13 1442)    Objective: VITAL SIGNS: Temp: 99.2 F (37.3 C) (08/04 0726) Temp src: Oral (08/04 0726) BP: 139/93 mmHg (08/04 0726) Pulse Rate: 86 (08/04 0726) SPO2; 100% on 2 L via [place] on room air FIO2:   Intake/Output Summary (Last 24 hours) at 12/01/13 0900 Last data filed at 12/01/13 0732  Gross per 24 hour  Intake 1217.09 ml  Output   2370 ml  Net -1152.91 ml     Exam: General: A./O. x4, response to simple questions with nods of her head yes or no, smiles and jokes with medical staff and daughter/son in law. When asked who she would like to have asked her POA points to her daughter Allison Gauland. No acute respiratory distress Lungs: Diffuse rhonchi  Lt > Rt, negative wheezes or crackles Cardiovascular: Regular rate and rhythm without murmur gallop or rub normal S1 and S2 Abdomen: Nontender, nondistended, soft, bowel sounds positive, no rebound, no ascites, no appreciable mass Extremities: No significant cyanosis, clubbing, or edema bilateral lower extremity  Data Reviewed:  Basic Metabolic Panel:  Recent Labs Lab 11/26/13 0401 11/27/13 0345 11/28/13 0240 11/29/13 0241  11/30/13 0220 12/01/13 0304  NA 149* 151* 143 143 148* 145  K 3.3* 2.8* 3.2* 4.1 2.9* 3.0*  CL 110 112 102 97 93* 89*  CO2 _0 33* 43* 42*  GLUCOSE 178* 97 239* 92 68* 105*  BUN 40* 46* 48* 45* 46* 41*  CREATININE 1.97* 1.98* 2.09* 2.22* 2.37* 2.18*  CALCIUM 8.7 8.6 8.0* 7.9* 8.1* 8.2*  MG 1.9  --  2.5 2.4 2.4  --    Liver Function Tests:  Recent Labs Lab 11/26/13 0401 11/27/13 0345 11/28/13 0240 11/29/13 0241 11/30/13 0220  AST _1 41* 35  ALT _2 ALKPHOS 66 64 61 54 58  BILITOT 0.2* 0.3 0.3 0.3 0.3  PROT 6.9 6.6 5.8* 6.0 5.7*  ALBUMIN 2.7* 2.6* 2.3* 2.5* 2.5*   No results found for this basename: LIPASE, AMYLASE,  in the last 168 hours No results found for this basename: AMMONIA,  in the last 168 hours CBC:  Recent Labs Lab 11/25/13 2101  11/27/13 1130 11/28/13 0240 11/29/13 0241 11/30/13 0220 12/01/13 0304  WBC 10.2  < > 16.3* 13.0* 12.7* 11.4* 12.6*  NEUTROABS 7.2  --  12.0* 9.1* 7.9* 7.2  --   HGB 12.9  < > 11.8* 10.2* 9.8* 9.5* 9.4*  HCT 42.0  < > 37.8 33.0* 32.4* 31.6* 31.4*  MCV 89.0  < > 89.2 90.2 89.5 91.3 93.2  PLT 256  < > 275 237 283 261 281  < > = values in this interval not displayed. Cardiac Enzymes:  Recent Labs Lab 11/29/13 0820 11/29/13 1414 11/29/13 1945  TROPONINI <0.30 <0.30 <0.30   BNP (last 3 results)  Recent Labs  11/20/13 1705 11/29/13 0820  PROBNP 832.1* 3701.0*   CBG:  Recent Labs Lab 11/30/13 1616 11/30/13 2043 12/01/13 0004 12/01/13 0418 12/01/13 0730  GLUCAP 155* 113* 107* 115* 133*    Recent Results (from the past 240 hour(s))  RESPIRATORY VIRUS PANEL     Status: None   Collection Time    11/22/13  7:58 PM      Result Value Ref Range Status   Source - RVPAN NASAL SWAB   Corrected   Comment: CORRECTED ON 07/27 AT 2040: PREVIOUSLY REPORTED AS NASAL SWAB   Respiratory Syncytial Virus A NOT DETECTED   Final   Respiratory Syncytial Virus B NOT DETECTED   Final   Influenza A NOT  DETECTED   Final   Influenza B NOT DETECTED   Final   Parainfluenza 1 NOT DETECTED   Final   Parainfluenza 2 NOT DETECTED   Final   Parainfluenza 3 NOT DETECTED   Final   Metapneumovirus NOT DETECTED   Final   Rhinovirus NOT DETECTED   Final   Adenovirus NOT DETECTED   Final   Influenza A H1 NOT DETECTED   Final   Influenza A H3 NOT DETECTED   Final   Comment: (NOTE)           Normal Reference Range for each Analyte: NOT DETECTED     Testing performed using the Lumine[employer] xTAG Respiratory Viral Panel test     kit.     The analytical performance characteristics of this assay have been     determined by [facility].  The modifications have not been     cleared or approved by the FDA. This assay has been  validated pursuant     to the CLIA regulations and is used for clinical purposes.     Performed at [facility], BLOOD (ROUTINE X 2)     Status: None   Collection Time    11/25/13  9:01 PM      Result Value Ref Range Status   Specimen Description BLOOD RIGHT HAND   Final   Special Requests     Final   Value: BOTTLES DRAWN AEROBIC AND ANAEROBIC RED 4 CC BLUE 10 CC   Culture  Setup Time     Final   Value: 11/26/2013 00:36     Performed at [facility]   Culture     Final   Value:        BLOOD CULTURE RECEIVED NO GROWTH TO DATE CULTURE WILL BE HELD FOR 5 DAYS BEFORE ISSUING A FINAL NEGATIVE REPORT     Performed at [facility]   Report Status PENDING   Incomplete  CULTURE, BLOOD (ROUTINE X 2)     Status: None   Collection Time    11/25/13  9:08 PM      Result Value Ref Range Status   Specimen Description BLOOD LEFT HAND   Final   Special Requests BOTTLES DRAWN AEROBIC ONLY 5 CC   Final   Culture  Setup Time     Final   Value: 11/26/2013 00:36     Performed at [facility]   Culture     Final   Value:        BLOOD CULTURE RECEIVED NO GROWTH TO DATE CULTURE WILL BE HELD FOR 5 DAYS BEFORE ISSUING A FINAL NEGATIVE REPORT     Performed  at [facility]   Report Status PENDING   Incomplete  URINE CULTURE     Status: None   Collection Time    11/25/13  9:30 PM      Result Value Ref Range Status   Specimen Description URINE, CATHETERIZED   Final   Special Requests NONE   Final   Culture  Setup Time     Final   Value: 11/25/2013 22:13     Performed at [facility] Count     Final   Value: NO GROWTH     Performed at [facility]   Culture     Final   Value: NO GROWTH     Performed at [facility]   Report Status 11/26/2013 FINAL   Final     Studies:  Recent x-ray studies have been reviewed in detail by the Attending Physician  Scheduled Meds:  Scheduled Meds: . antiseptic oral rinse  15 mL Mouth Rinse BID  . aspirin  300 mg Rectal Daily  . cloNIDine  0.6 mg Transdermal Weekly  . diazepam  2.5 mg Intravenous QHS  . heparin subcutaneous  5,000 Units Subcutaneous 3 times per day  . hydrALAZINE  20 mg Intravenous Q6H  . insulin aspart  0-15 Units Subcutaneous 6 times per day  . insulin glargine  30 Units Subcutaneous Daily  . metoprolol  10 mg Intravenous 6 times per day  . oxyCODONE  5 mg Sublingual Q12H  . potassium chloride  40 mEq Per Tube Once  . pregabalin  25 mg Oral QHS    Time spent on care of this patient: 40 mins   Allie Bossier , MD   [facility] Office  [phone] Pager - [phone]  On-Call/Text Page:      [email or web address]      password [email or web address]  If 7PM-7AM, please contact night-coverage [email or web address] Password [email or web address] 12/01/2013, 9:00 AM   LOS: 11 days

## 2013-12-02 DIAGNOSIS — K560 Paralytic ileus: Secondary | ICD-10-CM

## 2013-12-02 DIAGNOSIS — K929 Disease of digestive system, unspecified: Secondary | ICD-10-CM

## 2013-12-02 LAB — BASIC METABOLIC PANEL
ANION GAP: 14 (ref 5–15)
BUN: 44 mg/dL — ABNORMAL HIGH (ref 6–23)
CO2: 41 mEq/L (ref 19–32)
CREATININE: 2.49 mg/dL — AB (ref 0.50–1.10)
Calcium: 8 mg/dL — ABNORMAL LOW (ref 8.4–10.5)
Chloride: 86 mEq/L — ABNORMAL LOW (ref 96–112)
GFR calc non Af Amer: 19 mL/min — ABNORMAL LOW (ref >90)
GFR, EST AFRICAN AMERICAN: 22 mL/min — AB (ref >90)
Glucose, Bld: 220 mg/dL — ABNORMAL HIGH (ref 70–99)
Potassium: 3.2 mEq/L — ABNORMAL LOW (ref 3.7–5.3)
Sodium: 141 mEq/L (ref 137–147)

## 2013-12-02 LAB — CBC
HEMATOCRIT: 30.4 % — AB (ref 36.0–46.0)
HEMOGLOBIN: 9.2 g/dL — AB (ref 12.0–15.0)
MCH: 28 pg (ref 26.0–34.0)
MCHC: 30.3 g/dL (ref 30.0–36.0)
MCV: 92.7 fL (ref 78.0–100.0)
Platelets: 302 10*3/uL (ref 150–400)
RBC: 3.28 MIL/uL — ABNORMAL LOW (ref 3.87–5.11)
RDW: 15.8 % — AB (ref 11.5–15.5)
WBC: 15.5 10*3/uL — ABNORMAL HIGH (ref 4.0–10.5)

## 2013-12-02 LAB — MAGNESIUM: Magnesium: 2.1 mg/dL (ref 1.5–2.5)

## 2013-12-02 LAB — CULTURE, BLOOD (ROUTINE X 2)
Culture: NO GROWTH
Culture: NO GROWTH

## 2013-12-02 LAB — GLUCOSE, CAPILLARY
GLUCOSE-CAPILLARY: 176 mg/dL — AB (ref 70–99)
GLUCOSE-CAPILLARY: 178 mg/dL — AB (ref 70–99)
GLUCOSE-CAPILLARY: 188 mg/dL — AB (ref 70–99)
Glucose-Capillary: 235 mg/dL — ABNORMAL HIGH (ref 70–99)
Glucose-Capillary: 174 mg/dL — ABNORMAL HIGH (ref 70–99)

## 2013-12-02 LAB — BLOOD GAS, ARTERIAL
ACID-BASE EXCESS: 20.1 mmol/L — AB (ref 0.0–2.0)
Bicarbonate: 45.2 mEq/L — ABNORMAL HIGH (ref 20.0–24.0)
DRAWN BY: 252031
O2 Content: 2 L/min
O2 SAT: 98.3 %
PCO2 ART: 56.9 mmHg — AB (ref 35.0–45.0)
Patient temperature: 98.6
TCO2: 46.9 mmol/L (ref 0–100)
pH, Arterial: 7.511 — ABNORMAL HIGH (ref 7.350–7.450)
pO2, Arterial: 102 mmHg — ABNORMAL HIGH (ref 80.0–100.0)

## 2013-12-02 MED ORDER — LEVOTHYROXINE SODIUM 88 MCG PO TABS: 88.0000 ug | ORAL_TABLET | Freq: Every day | ORAL | Status: AC

## 2013-12-02 MED ORDER — GEMFIBROZIL 600 MG PO TABS: 600.0000 mg | ORAL_TABLET | Freq: Two times a day (BID) | ORAL | Status: AC

## 2013-12-02 MED ORDER — INSULIN GLARGINE 100 UNIT/ML ~~LOC~~ SOLN: 30.0000 [IU] | Freq: Every day | SUBCUTANEOUS | Status: AC

## 2013-12-02 MED ORDER — ROSUVASTATIN CALCIUM 40 MG PO TABS: 40.0000 mg | ORAL_TABLET | Freq: Every day | ORAL | Status: AC

## 2013-12-02 MED ORDER — PRO-STAT SUGAR FREE PO LIQD: 30.0000 mL | Freq: Every day | ORAL | Status: AC

## 2013-12-02 MED ORDER — METOPROLOL TARTRATE 25 MG PO TABS: 25.0000 mg | ORAL_TABLET | Freq: Two times a day (BID) | ORAL | Status: AC

## 2013-12-02 MED ORDER — ASPIRIN 325 MG PO TABS
325.0000 mg | ORAL_TABLET | Freq: Every day | ORAL | Status: DC
  Filled 2013-12-02: qty 1

## 2013-12-02 MED ORDER — ACETAMINOPHEN 325 MG PO TABS: 650.0000 mg | ORAL_TABLET | Freq: Four times a day (QID) | ORAL | Status: AC | PRN

## 2013-12-02 MED ORDER — SODIUM CHLORIDE 0.9 % IV SOLN
INTRAVENOUS | Status: DC
  Administered 2013-12-02: 11:00:00 via INTRAVENOUS

## 2013-12-02 MED ORDER — PREGABALIN 25 MG PO CAPS: 25.0000 mg | ORAL_CAPSULE | Freq: Every day | ORAL | Status: DC

## 2013-12-02 MED ORDER — GLUCERNA 1.2 CAL PO LIQD: 1000.0000 mL | ORAL | Status: AC

## 2013-12-02 MED ORDER — OMEGA-3 FATTY ACIDS 1000 MG PO CAPS: 1.0000 g | ORAL_CAPSULE | Freq: Two times a day (BID) | ORAL | Status: AC

## 2013-12-02 MED ORDER — GEMFIBROZIL 600 MG PO TABS
600.0000 mg | ORAL_TABLET | Freq: Two times a day (BID) | ORAL | Status: DC
  Administered 2013-12-02: 600 mg
  Filled 2013-12-02 (×2): qty 1

## 2013-12-02 MED ORDER — METOPROLOL TARTRATE 25 MG/10 ML ORAL SUSPENSION
25.0000 mg | Freq: Two times a day (BID) | ORAL | Status: DC
  Administered 2013-12-02: 25 mg
  Filled 2013-12-02 (×2): qty 10

## 2013-12-02 MED ORDER — INSULIN ASPART 100 UNIT/ML ~~LOC~~ SOLN: 0.0000 [IU] | SUBCUTANEOUS | Status: AC

## 2013-12-02 MED ORDER — OXYCODONE HCL 20 MG/ML PO CONC: 6.0000 mg | Freq: Four times a day (QID) | ORAL | Status: DC | PRN

## 2013-12-02 MED ORDER — ASPIRIN 81 MG PO TABS: 324.0000 mg | ORAL_TABLET | Freq: Every morning | ORAL | Status: AC

## 2013-12-02 MED ORDER — HYDRALAZINE HCL 25 MG PO TABS
25.0000 mg | ORAL_TABLET | Freq: Four times a day (QID) | ORAL | Status: DC
  Administered 2013-12-02: 25 mg
  Filled 2013-12-02 (×3): qty 1

## 2013-12-02 MED ORDER — OXYCODONE HCL 20 MG/ML PO CONC
5.0000 mg | Freq: Four times a day (QID) | ORAL | Status: DC | PRN
Start: 2013-12-02 — End: 2013-12-02

## 2013-12-02 MED ORDER — LEVOTHYROXINE SODIUM 88 MCG PO TABS
88.0000 ug | ORAL_TABLET | Freq: Every day | ORAL | Status: DC
  Administered 2013-12-02: 88 ug
  Filled 2013-12-02: qty 1

## 2013-12-02 MED ORDER — ACETAMINOPHEN 160 MG/5ML PO SOLN: 650.0000 mg | Freq: Four times a day (QID) | ORAL | Status: DC | PRN

## 2013-12-02 MED ORDER — HYDRALAZINE HCL 10 MG PO TABS: 20.0000 mg | ORAL_TABLET | Freq: Four times a day (QID) | ORAL | Status: AC

## 2013-12-02 MED ORDER — LORAZEPAM 0.5 MG PO TABS: 0.5000 mg | ORAL_TABLET | Freq: Three times a day (TID) | ORAL | Status: DC | PRN

## 2013-12-02 MED ORDER — CLONIDINE HCL 0.3 MG/24HR TD PTWK: 0.6000 mg | MEDICATED_PATCH | TRANSDERMAL | Status: AC

## 2013-12-02 NOTE — Discharge Summary (Addendum)
DISCHARGE SUMMARY  Allison Washington  MR#: [id number]  DOB:Nov 12, 1945  Date of Admission: 11/20/2013 Date of Discharge: 12/02/2013  Attending Physician:Willis Holquin T  Patient's WUJ:[id number], [name], MD  Consults: Dr. Noel Christmas (neurology)  Dr. Marca Ancona (cardiology)  Dr. Rory Percy (PCCM)  Dr. Bonnielee Haff (interventional radiology)  Disposition: D/C to SNF / Return to SNF   Follow-up Appts:     Follow-up Information   Follow up with SNF facility MD of record.   Contact information:   Your ongoing care will be provided by the MD of record at you SNF of choice.      Tests Needing Follow-up: BMET is suggested in 2 days to recheck her serum bicarbonate/metabolic alkalosis as well as K+ level   Discharge Diagnoses: NSTEMI  Diastolic CHF  Metabolic alkalosis Severely uncontrolled Hypertension  Acute on chronic stage III renal failure Metabolic Encephalopathy Aspiration PNA Hypernatremia  Hypokalemia  Hypomagnesemia  Abdominal pain  Diabetes type 2 uncontrolled   Initial presentation: 68 y.o. F w/ Hx Depression, CVA x5, hemiplegia and hemiparesis, HTN, chronic pain (AKA), IDDM, & Seizure d/o who presented with encephalopathy. Pt is a resident at a local SNF. Per report, pt was unresponsive despite vigorous arousal. Family reported pt has an overall fairly low functional level at baseline. Blood sugar was low on EMS arrival. Was given D50 with blood sugars increasing into 200s. There was reported multiple episodes of vomiting in transport to ER.  On arrival to ER, temp 97.6, HR 70s-100s, BP 150s-230s systolic, Satting >95% on non rebreather. WBC 16.7, Hgb 12.5, K 3.5, Cr 2.8 (baselin 1.9), Glu 182. Lactate 2.3. Trop WNL. Head CT with no acute findings. CXR with chronic lung changes with superimposed edema without infiltrate. Pt with fentanyl patch in place on arrival. This was removed. Pt was given small dose of narcan with subsequent improvement in mentation.    Hospital Course:  NSTEMI  -Suffered a bradycardic arrest in the setting of respiratory distress  -troponin increase likely related to demand ischemia  -Per Dr. Jens Som not a candidate for aggressive cardiac measures  -Continue medical therapy w/ ASA + BB + statin -well compensated at time of d/c   Diastolic CHF  -appears euvolemic at time of d/c - BP control is key   Metabolic alkalosis -etiology unclear - no active diarrhea or vomiting/gasric suction - does appear somewhat clinically dry so may be component of contraction - by [facility] equation, does appear to be a simple metabolic alkalosis - will need to be rechecked in outpt setting - is improving at time of d/c, with bicarb 45 >> 41 over last 24hrs   Severely uncontrolled Hypertension  -BP now well controlled   Acute on chronic stage III renal failure  -crt stable but not yet back to baseline - follow renal function as outpt - baseline crt appears to be ~1.6-1.8 - new baseline may be 2.5  Metabolic Encephalopathy:  -Patient back to baseline (able to nod yes and no to simple questions) as supported by discission between Dr. Joseph Art and family on 12/01/2013  Aspiration PNA:  -Patient high-risk of aspiration, completed antibiotics for 7 day course  -Continue patient n.p.o.; patient failed swallow study on 7/28  -Failed modified barium swallow 7/30  -Interventional radiology placed PEG/gastric tube 8/3 - tolerating tube feeds w/o signif complications at this time   Hypernatremia  -resolved w/ free water (initially via IV, now via PEG)  Hypokalemia  -correction ongoing - will need to be monitored in SNF setting  Hypomagnesemia  -Resolved - Mg 2.1 at time of d/c   Abdominal pain  -Resolved per patient   Diabetes type 2 uncontrolled  -7/24 hemoglobin A1c= 9.5  -ongoing monitoring of CBG suggested in her SNF     Medication List    STOP taking these medications       amLODipine 10 MG tablet  Commonly known as:  NORVASC      cloNIDine 0.2 mg/24hr patch  Commonly known as:  CATAPRES - Dosed in mg/24 hr  Replaced by:  cloNIDine 0.3 mg/24hr patch     fentaNYL 50 MCG/HR  Commonly known as:  DURAGESIC - dosed mcg/hr     furosemide 40 MG tablet  Commonly known as:  LASIX     linagliptin 5 MG Tabs tablet  Commonly known as:  TRADJENTA     losartan 25 MG tablet  Commonly known as:  COZAAR     MYRBETRIQ 50 MG Tb24 tablet  Generic drug:  mirabegron ER     nitroGLYCERIN 0.4 MG SL tablet  Commonly known as:  NITROSTAT     NOVOLOG FLEXPEN 100 UNIT/ML FlexPen  Generic drug:  insulin aspart  Replaced by:  insulin aspart 100 UNIT/ML injection     oxyCODONE 5 MG immediate release tablet  Commonly known as:  Oxy IR/ROXICODONE  Replaced by:  oxyCODONE 20 MG/ML concentrated solution     polyethylene glycol packet  Commonly known as:  MIRALAX / GLYCOLAX     sennosides-docusate sodium 8.6-50 MG tablet  Commonly known as:  SENOKOT-S     zolpidem 5 MG tablet  Commonly known as:  AMBIEN      TAKE these medications       acetaminophen 325 MG tablet  Commonly known as:  TYLENOL  Place 2 tablets (650 mg total) into feeding tube every 6 (six) hours as needed for fever (pain).     aspirin 81 MG tablet  Place 4 tablets (324 mg total) into feeding tube every morning.     cloNIDine 0.3 mg/24hr patch  Commonly known as:  CATAPRES - Dosed in mg/24 hr  Place 2 patches (0.6 mg total) onto the skin once a week.     feeding supplement (GLUCERNA 1.2 CAL) Liqd  Place 1,000 mLs into feeding tube continuous.     feeding supplement (PRO-STAT SUGAR FREE 64) Liqd  Place 30 mLs into feeding tube daily at 3 pm.     fish oil-omega-3 fatty acids 1000 MG capsule  Place 1 capsule (1 g total) into feeding tube 2 (two) times daily.     gemfibrozil 600 MG tablet  Commonly known as:  LOPID  Place 1 tablet (600 mg total) into feeding tube 2 (two) times daily before a meal.     hydrALAZINE 10 MG tablet  Commonly known as:   APRESOLINE  Place 2 tablets (20 mg total) into feeding tube 4 (four) times daily.     insulin aspart 100 UNIT/ML injection  Commonly known as:  novoLOG  Inject 0-15 Units into the skin every 4 (four) hours.     insulin glargine 100 UNIT/ML injection  Commonly known as:  LANTUS  Inject 0.3 mLs (30 Units total) into the skin daily.     levothyroxine 88 MCG tablet  Commonly known as:  SYNTHROID, LEVOTHROID  Place 1 tablet (88 mcg total) into feeding tube daily.     LORazepam 0.5 MG tablet  Commonly known as:  ATIVAN  Place 1 tablet (0.5 mg total) into feeding tube  every 8 (eight) hours as needed for anxiety.     metoprolol tartrate 25 MG tablet  Commonly known as:  LOPRESSOR  Place 1 tablet (25 mg total) into feeding tube 2 (two) times daily.     oxyCODONE 20 MG/ML concentrated solution  Commonly known as:  ROXICODONE INTENSOL  Place 0.3 mLs (6 mg total) into feeding tube every 6 (six) hours as needed for severe pain.     pregabalin 25 MG capsule  Commonly known as:  LYRICA  Place 1 capsule (25 mg total) into feeding tube at bedtime.     rosuvastatin 40 MG tablet  Commonly known as:  CRESTOR  Place 1 tablet (40 mg total) into feeding tube at bedtime.       Day of Discharge BP 112/41  Pulse 72  Temp(Src) 100.1 F (37.8 C) (Axillary)  Resp 20  Ht 5\' 3"  (1.6 m)  Wt 81 kg (178 lb 9.2 oz)  BMI 31.64 kg/m2  SpO2 100%  Physical Exam: General: No acute respiratory distress - alert and nods yes or no responses to questions  Lungs: Clear to auscultation bilaterally without wheezes or crackles Cardiovascular: Regular rate and rhythm without murmur gallop or rub normal S1 and S2 Abdomen: Nontender, nondistended, soft, bowel sounds positive, no rebound, no ascites, no appreciable mass - PEG insertion site clean and dry under abdom binder  Extremities: No significant cyanosis, clubbing, or edema bilateral lower extremities  Results for orders placed during the hospital encounter  of 11/20/13 (from the past 24 hour(s))  POTASSIUM     Status: None   Collection Time    12/01/13  2:28 PM      Result Value Ref Range   Potassium 3.9  3.7 - 5.3 mEq/L  GLUCOSE, CAPILLARY     Status: Abnormal   Collection Time    12/01/13  3:32 PM      Result Value Ref Range   Glucose-Capillary 163 (*) 70 - 99 mg/dL   Comment 1 Documented in Chart    BLOOD GAS, ARTERIAL     Status: Abnormal   Collection Time    12/01/13  6:25 PM      Result Value Ref Range   O2 Content 2.0     Delivery systems NASAL CANNULA     pH, Arterial 7.535 (*) 7.350 - 7.450   pCO2 arterial 55.4 (*) 35.0 - 45.0 mmHg   pO2, Arterial 88.7  80.0 - 100.0 mmHg   Bicarbonate 46.7 (*) 20.0 - 24.0 mEq/L   TCO2 48.4  0 - 100 mmol/L   Acid-Base Excess 21.7 (*) 0.0 - 2.0 mmol/L   O2 Saturation 97.5     Patient temperature 98.6     Collection site LEFT RADIAL     Drawn by [id number]     Sample type ARTERIAL DRAW     Allens test (pass/fail) PASS  PASS  BASIC METABOLIC PANEL     Status: Abnormal   Collection Time    12/01/13  6:50 PM      Result Value Ref Range   Sodium 143  137 - 147 mEq/L   Potassium 3.4 (*) 3.7 - 5.3 mEq/L   Chloride 86 (*) 96 - 112 mEq/L   CO2 45 (*) 19 - 32 mEq/L   Glucose, Bld 123 (*) 70 - 99 mg/dL   BUN 41 (*) 6 - 23 mg/dL   Creatinine, Ser 4.78 (*) 0.50 - 1.10 mg/dL   Calcium 8.3 (*) 8.4 - 10.5 mg/dL  GFR calc non Af Amer 19 (*) >90 mL/min   GFR calc Af Amer 22 (*) >90 mL/min   Anion gap 12  5 - 15  MAGNESIUM     Status: None   Collection Time    12/01/13  6:50 PM      Result Value Ref Range   Magnesium 2.2  1.5 - 2.5 mg/dL  GLUCOSE, CAPILLARY     Status: Abnormal   Collection Time    12/01/13  7:26 PM      Result Value Ref Range   Glucose-Capillary 105 (*) 70 - 99 mg/dL  GLUCOSE, CAPILLARY     Status: Abnormal   Collection Time    12/02/13 12:10 AM      Result Value Ref Range   Glucose-Capillary 178 (*) 70 - 99 mg/dL  CBC     Status: Abnormal   Collection Time    12/02/13   2:55 AM      Result Value Ref Range   WBC 15.5 (*) 4.0 - 10.5 K/uL   RBC 3.28 (*) 3.87 - 5.11 MIL/uL   Hemoglobin 9.2 (*) 12.0 - 15.0 g/dL   HCT 16.1 (*) 09.6 - 04.5 %   MCV 92.7  78.0 - 100.0 fL   MCH 28.0  26.0 - 34.0 pg   MCHC 30.3  30.0 - 36.0 g/dL   RDW 40.9 (*) 81.1 - 91.4 %   Platelets 302  150 - 400 K/uL  MAGNESIUM     Status: None   Collection Time    12/02/13  2:55 AM      Result Value Ref Range   Magnesium 2.1  1.5 - 2.5 mg/dL  BASIC METABOLIC PANEL     Status: Abnormal   Collection Time    12/02/13  2:55 AM      Result Value Ref Range   Sodium 141  137 - 147 mEq/L   Potassium 3.2 (*) 3.7 - 5.3 mEq/L   Chloride 86 (*) 96 - 112 mEq/L   CO2 41 (*) 19 - 32 mEq/L   Glucose, Bld 220 (*) 70 - 99 mg/dL   BUN 44 (*) 6 - 23 mg/dL   Creatinine, Ser 7.82 (*) 0.50 - 1.10 mg/dL   Calcium 8.0 (*) 8.4 - 10.5 mg/dL   GFR calc non Af Amer 19 (*) >90 mL/min   GFR calc Af Amer 22 (*) >90 mL/min   Anion gap 14  5 - 15  GLUCOSE, CAPILLARY     Status: Abnormal   Collection Time    12/02/13  4:04 AM      Result Value Ref Range   Glucose-Capillary 235 (*) 70 - 99 mg/dL   Comment 1 Notify RN    BLOOD GAS, ARTERIAL     Status: Abnormal   Collection Time    12/02/13  4:47 AM      Result Value Ref Range   O2 Content 2.0     pH, Arterial 7.511 (*) 7.350 - 7.450   pCO2 arterial 56.9 (*) 35.0 - 45.0 mmHg   pO2, Arterial 102.0 (*) 80.0 - 100.0 mmHg   Bicarbonate 45.2 (*) 20.0 - 24.0 mEq/L   TCO2 46.9  0 - 100 mmol/L   Acid-Base Excess 20.1 (*) 0.0 - 2.0 mmol/L   O2 Saturation 98.3     Patient temperature 98.6     Collection site RIGHT RADIAL     Drawn by [id number]     Sample type ARTERIAL     Allens  test (pass/fail) PASS  PASS  GLUCOSE, CAPILLARY     Status: Abnormal   Collection Time    12/02/13  8:25 AM      Result Value Ref Range   Glucose-Capillary 174 (*) 70 - 99 mg/dL  GLUCOSE, CAPILLARY     Status: Abnormal   Collection Time    12/02/13 12:30 PM      Result Value Ref  Range   Glucose-Capillary 188 (*) 70 - 99 mg/dL    Time spent in discharge (includes decision making & examination of pt): >35 minutes  12/02/2013, 2:19 PM   Lonia BloodJeffrey T. Madline Oesterling, MD [facility] Office  [phone] Pager [phone]  On-Call/Text Page:      [email or web address]      password [email or web address]

## 2013-12-02 NOTE — Progress Notes (Signed)
CRITICAL VALUE ALERT  Critical value received:  CO2 41  Date of notification:  12-02-13  Time of notification:  0451  Critical value read back: yes  Nurse who received alert:  Nickolas MadridLetitia Cruzito Standre, RN  MD notified (1st page):  Adalberto IllM. York, PA  Time of first page:  [phone]

## 2013-12-02 NOTE — Clinical Social Work Note (Signed)
CSW has faxed discharge summary to [facility]. Discharge packet complete and placed on pt's shadow chart. Pt's daughter, Cala BradfordKimberly, updated regarding discharge. Transportation to be arranged via EMS ([facility]).  RN to please call report to [facility] at [phone] -RN please call pt's daughter, Cala BradfordKimberly, when EMS arrives on unit.  Marcelline DeistEmily Kapono Luhn, MSW, [facility] CenterCSWA Licensed Clinical Social Worker [phone] and [phone] [phone]

## 2013-12-03 ENCOUNTER — Non-Acute Institutional Stay (SKILLED_NURSING_FACILITY): Payer: Medicare Other | Admitting: Internal Medicine

## 2013-12-03 ENCOUNTER — Other Ambulatory Visit: Payer: Self-pay | Admitting: *Deleted

## 2013-12-03 DIAGNOSIS — K7581 Nonalcoholic steatohepatitis (NASH): Secondary | ICD-10-CM

## 2013-12-03 DIAGNOSIS — E872 Acidosis, unspecified: Secondary | ICD-10-CM

## 2013-12-03 DIAGNOSIS — I5033 Acute on chronic diastolic (congestive) heart failure: Secondary | ICD-10-CM

## 2013-12-03 DIAGNOSIS — E1149 Type 2 diabetes mellitus with other diabetic neurological complication: Secondary | ICD-10-CM

## 2013-12-03 DIAGNOSIS — IMO0002 Reserved for concepts with insufficient information to code with codable children: Secondary | ICD-10-CM

## 2013-12-03 DIAGNOSIS — N183 Chronic kidney disease, stage 3 unspecified: Secondary | ICD-10-CM

## 2013-12-03 DIAGNOSIS — G9340 Encephalopathy, unspecified: Secondary | ICD-10-CM

## 2013-12-03 DIAGNOSIS — E870 Hyperosmolality and hypernatremia: Secondary | ICD-10-CM

## 2013-12-03 DIAGNOSIS — K7689 Other specified diseases of liver: Secondary | ICD-10-CM

## 2013-12-03 DIAGNOSIS — E1165 Type 2 diabetes mellitus with hyperglycemia: Secondary | ICD-10-CM

## 2013-12-03 DIAGNOSIS — E876 Hypokalemia: Secondary | ICD-10-CM

## 2013-12-03 DIAGNOSIS — J690 Pneumonitis due to inhalation of food and vomit: Secondary | ICD-10-CM

## 2013-12-03 DIAGNOSIS — N179 Acute kidney failure, unspecified: Secondary | ICD-10-CM

## 2013-12-03 DIAGNOSIS — I10 Essential (primary) hypertension: Secondary | ICD-10-CM

## 2013-12-03 DIAGNOSIS — I214 Non-ST elevation (NSTEMI) myocardial infarction: Secondary | ICD-10-CM

## 2013-12-03 DIAGNOSIS — D638 Anemia in other chronic diseases classified elsewhere: Secondary | ICD-10-CM

## 2013-12-03 DIAGNOSIS — Z931 Gastrostomy status: Secondary | ICD-10-CM

## 2013-12-03 MED ORDER — OXYCODONE HCL 20 MG/ML PO CONC: 6.0000 mg | Freq: Four times a day (QID) | ORAL | Status: AC | PRN

## 2013-12-03 MED ORDER — PREGABALIN 25 MG PO CAPS: 25.0000 mg | ORAL_CAPSULE | Freq: Every day | ORAL | Status: AC

## 2013-12-03 MED ORDER — LORAZEPAM 0.5 MG PO TABS: 0.5000 mg | ORAL_TABLET | Freq: Three times a day (TID) | ORAL | Status: AC | PRN

## 2013-12-03 NOTE — Progress Notes (Signed)
MRN: [id number] Name: Allison Washington  Sex: female Age: 68 y.o. DOB: 02-17-1946  PSC #: [name] Facility/Room: 105 Level Of Care: SNF Provider: Merrilee Seashore D Emergency Contacts: Extended Emergency Contact Information Primary Emergency Contact: Morris,Kimberly Address: [address]          [place], [place] [address] D[place] of [place] Home Phone: [phone] Relation: Daughter Secondary Emergency Contact: Fransisco Hertz Address: [address]          [place], [place] [address] D[place] of [place] Home Phone: [phone] Relation: Son  Code Status: FULL, against the advice of pallative and every other discipline  Allergies: Review of patient's allergies indicates no known allergies.  Chief Complaint  Patient presents with  . nursing home admission    HPI: Patient is 68 y.o. female who was  s/p CVA X 5 who was hospitalized with a NSTEMI, aspiration PNA plus many other problems admitted now to SNF in severely decreased capacity, on tube feedings.  Past Medical History  Diagnosis Date  . 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 Diabetes mellitus     Prone to hypoglycemia  . Aphagia     Status post numerous  . Dysphagia   . Peripheral vascular disease, unspecified   . Arterial disease   . Depressive disorder   . Hypothyroid   . Seizures   . MRSA infection     History of MRSA in the left leg wound and sacrum in the past-has been seen by vascular surgery and declined any type of intervention including arteriogram  . Diverticulosis     GI bleed in 2007-s/p endoscopy and colonoscopy in 2007 s/p massive GIB consittent with Diveerticulosis  . DVT (deep venous thrombosis)     Not on Coumadin  . Anemia     Chronic  . Non-smoker   . Stroke   . Hypertension   . History of DVT (deep vein thrombosis) 03/06/2011  .  Nursing home acquired MRSA infection 03/06/2011    History of MRSA in the left leg wound and sacrum in the past-has been seen by vascular surgery and declined any type of intervention including arteriogram   . SEIZURE DISORDER 12/17/2006    Qualifier: Diagnosis of  By: Clent Ridges MD, Tera Mater     Past Surgical History  Procedure Laterality Date  . Above knee leg amputation    . Femoral-popliteal bypass graft        Medication List       This list is accurate as of: 12/03/13 11:59 PM.  Always use your most recent med list.               acetaminophen 325 MG tablet  Commonly known as:  TYLENOL  Place 2 tablets (650 mg total) into feeding tube every 6 (six) hours as needed for fever (pain).     aspirin 81 MG tablet  Place 4 tablets (324 mg total) into feeding tube every morning.     cloNIDine 0.3 mg/24hr patch  Commonly known as:  CATAPRES - Dosed in mg/24 hr  Place 2 patches (0.6 mg total) onto the skin once a week.     feeding supplement (GLUCERNA 1.2 CAL) Liqd  Place 1,000 mLs into feeding tube continuous.     feeding supplement (PRO-STAT SUGAR FREE 64) Liqd  Place 30 mLs into feeding tube daily at  3 pm.     fish oil-omega-3 fatty acids 1000 MG capsule  Place 1 capsule (1 g total) into feeding tube 2 (two) times daily.     gemfibrozil 600 MG tablet  Commonly known as:  LOPID  Place 1 tablet (600 mg total) into feeding tube 2 (two) times daily before a meal.     hydrALAZINE 10 MG tablet  Commonly known as:  APRESOLINE  Place 2 tablets (20 mg total) into feeding tube 4 (four) times daily.     insulin aspart 100 UNIT/ML injection  Commonly known as:  novoLOG  Inject 0-15 Units into the skin every 4 (four) hours.     insulin glargine 100 UNIT/ML injection  Commonly known as:  LANTUS  Inject 0.3 mLs (30 Units total) into the skin daily.     levothyroxine 88 MCG tablet  Commonly known as:  SYNTHROID, LEVOTHROID  Place 1 tablet (88 mcg total) into feeding tube daily.      LORazepam 0.5 MG tablet  Commonly known as:  ATIVAN  Place 1 tablet (0.5 mg total) into feeding tube every 8 (eight) hours as needed for anxiety.     metoprolol tartrate 25 MG tablet  Commonly known as:  LOPRESSOR  Place 1 tablet (25 mg total) into feeding tube 2 (two) times daily.     oxyCODONE 20 MG/ML concentrated solution  Commonly known as:  ROXICODONE INTENSOL  Place 0.3 mLs (6 mg total) into feeding tube every 6 (six) hours as needed for severe pain.     pregabalin 25 MG capsule  Commonly known as:  LYRICA  Place 1 capsule (25 mg total) into feeding tube at bedtime.     rosuvastatin 40 MG tablet  Commonly known as:  CRESTOR  Place 1 tablet (40 mg total) into feeding tube at bedtime.        No orders of the defined types were placed in this encounter.    Immunization History  Administered Date(s) Administered  . Influenza-Unspecified 01/29/2013  . Pneumococcal-Unspecified 03/02/2011    History  Substance Use Topics  . Smoking status: Never Smoker   . Smokeless tobacco: Not on file  . Alcohol Use: No    Family history is noncontributory    Review of Systems  UTO from pt  Wound nurse reports 2 small decubitis     Filed Vitals:   12/03/13 2055  BP: 139/56  Pulse: 86  Temp: 98.7 F (37.1 C)  Resp: 20    Physical Exam  GENERAL APPEARANCE: sleeping. No acute distress.  SKIN: No diaphoresis rash HEENT -unremarkable MOUTH/THROAT: Lips w/o lesions.   RESPIRATORY: Breathing is even, unlabored. Lung sounds are clear   CARDIOVASCULAR: Heart RRR no murmurs, rubs or gallops. No peripheral edema.  GASTROINTESTINAL: Abdomen is soft, non-tender, not distended w/ normal bowel sounds;PEG in place GENITOURINARY: Bladder non tender, not distended  MUSCULOSKELETAL: AKA NEUROLOGIC:  Cranial nerves 2-12 grossly intact PSYCHIATRIC:  no behavioral issues  Patient Active Problem List   Diagnosis Date Noted  . NSTEMI (non-ST elevated myocardial infarction) 12/06/2013   . Metabolic acidosis 12/06/2013  . Acute encephalopathy 12/06/2013  . Aspiration pneumonia 12/06/2013  . Hypernatremia 12/06/2013  . Hypokalemia 12/06/2013  . PEG (percutaneous endoscopic gastrostomy) status 12/06/2013  . Acute renal failure superimposed on stage 3 chronic kidney disease 11/21/2013  . NASH (nonalcoholic steatohepatitis) 11/21/2013  . Anemia of chronic disease 11/21/2013  . Acute on chronic diastolic heart failure 11/21/2013  . Unspecified hypothyroidism 04/07/2013  . Neuropathy 02/02/2013  .  Other and unspecified hyperlipidemia 10/20/2012  . CHF (congestive heart failure) 02/09/2012  . CVA (cerebral vascular accident) 03/06/2011  . S/P AKA (above knee amputation) unilateral 03/06/2011  . Diabetes mellitus with neurological manifestations, uncontrolled 12/17/2006  . HYPERTENSION 12/17/2006  . SEIZURE DISORDER 12/17/2006  . URINARY INCONTINENCE 12/17/2006    CBC    Component Value Date/Time   WBC 15.5* 12/02/2013 0255   RBC 3.28* 12/02/2013 0255   HGB 9.2* 12/02/2013 0255   HCT 30.4* 12/02/2013 0255   PLT 302 12/02/2013 0255   MCV 92.7 12/02/2013 0255   LYMPHSABS 2.9 11/30/2013 0220   MONOABS 1.1* 11/30/2013 0220   EOSABS 0.2 11/30/2013 0220   BASOSABS 0.0 11/30/2013 0220    CMP     Component Value Date/Time   NA 141 12/02/2013 0255   K 3.2* 12/02/2013 0255   CL 86* 12/02/2013 0255   CO2 41* 12/02/2013 0255   GLUCOSE 220* 12/02/2013 0255   BUN 44* 12/02/2013 0255   CREATININE 2.49* 12/02/2013 0255   CALCIUM 8.0* 12/02/2013 0255   CALCIUM 9.4 11/17/2007 1530   PROT 5.7* 11/30/2013 0220   ALBUMIN 2.5* 11/30/2013 0220   AST 35 11/30/2013 0220   ALT 20 11/30/2013 0220   ALKPHOS 58 11/30/2013 0220   BILITOT 0.3 11/30/2013 0220   GFRNONAA 19* 12/02/2013 0255   GFRAA 22* 12/02/2013 0255    Assessment and Plan  NSTEMI (non-ST elevated myocardial infarction) -Suffered a bradycardic arrest in the setting of respiratory distress  -troponin increase likely related to demand ischemia  -Per Dr.  Jens Som not a candidate for aggressive cardiac measures  -Continue medical therapy w/ ASA + BB + statin  -well compensated at time of d/c   Acute on chronic diastolic heart failure appears euvolemic at time of d/c - BP control is key    Metabolic acidosis -etiology unclear - no active diarrhea or vomiting/gasric suction - does appear somewhat clinically dry so may be component of contraction - by [facility] equation, does appear to be a simple metabolic alkalosis - will need to be rechecked in outpt setting - is improving at time of d/c, with bicarb 45 >> 41 over last 24hrs    Acute renal failure superimposed on stage 3 chronic kidney disease -crt stable but not yet back to baseline - follow renal function as outpt - baseline crt appears to be ~1.6-1.8 - new baseline may be 2.5   HYPERTENSION Out of control at presentation to hospital; now on clonidine 0.6mg , hydralazine 20 mg and metoprolol 25 BID  Acute encephalopathy Patient back to baseline (able to nod yes and no to simple questions) as supported by discission between Dr. Joseph Art and family on 12/01/2013   Aspiration pneumonia Patient high-risk of aspiration, completed antibiotics for 7 day course  -Continue patient n.p.o.; patient failed swallow study on 7/28  -Failed modified barium swallow 7/30  -Interventional radiology placed PEG/gastric tube 8/3 - tolerating tube feeds w/o signif complications at this time    Hypernatremia -resolved w/ free water (initially via IV, now via PEG   Hypokalemia Corrected and ongoing problem  NASH (nonalcoholic steatohepatitis) Cause of abd pain/ pain resolved  Diabetes mellitus with neurological manifestations, uncontrolled Prior pt very non compliant with diet;7/24 Hba1c 9.4; obviously not a problem going on now yet  Anemia of chronic disease HB stable at 9.2    Margit Hanks, MD

## 2013-12-03 NOTE — Telephone Encounter (Signed)
[facility]  

## 2013-12-06 ENCOUNTER — Encounter: Payer: Self-pay | Admitting: Internal Medicine

## 2013-12-06 DIAGNOSIS — I214 Non-ST elevation (NSTEMI) myocardial infarction: Secondary | ICD-10-CM | POA: Insufficient documentation

## 2013-12-06 DIAGNOSIS — E872 Acidosis, unspecified: Secondary | ICD-10-CM | POA: Insufficient documentation

## 2013-12-06 DIAGNOSIS — E870 Hyperosmolality and hypernatremia: Secondary | ICD-10-CM | POA: Insufficient documentation

## 2013-12-06 DIAGNOSIS — J690 Pneumonitis due to inhalation of food and vomit: Secondary | ICD-10-CM | POA: Insufficient documentation

## 2013-12-06 DIAGNOSIS — E876 Hypokalemia: Secondary | ICD-10-CM | POA: Insufficient documentation

## 2013-12-06 DIAGNOSIS — G9340 Encephalopathy, unspecified: Secondary | ICD-10-CM | POA: Insufficient documentation

## 2013-12-06 DIAGNOSIS — Z931 Gastrostomy status: Secondary | ICD-10-CM | POA: Insufficient documentation

## 2013-12-06 NOTE — Assessment & Plan Note (Signed)
-  etiology unclear - no active diarrhea or vomiting/gasric suction - does appear somewhat clinically dry so may be component of contraction - [facility] equation, does appear to be a simple metabolic alkalosis - will need to be rechecked in outpt setting - is improving at time of d/c, with bicarb 45 >> 41 over last 24hrs

## 2013-12-06 NOTE — Assessment & Plan Note (Signed)
-  Suffered a bradycardic arrest in the setting of respiratory distress  -troponin increase likely related to demand ischemia  -Per Dr. Jens Somrenshaw not a candidate for aggressive cardiac measures  -Continue medical therapy w/ ASA + BB + statin  -well compensated at time of d/c

## 2013-12-06 NOTE — Assessment & Plan Note (Signed)
Patient back to baseline (able to nod yes and no to simple questions) as supported by discission between Dr. Joseph ArtWoods and family on 12/01/2013

## 2013-12-06 NOTE — Assessment & Plan Note (Signed)
Corrected and ongoing problem

## 2013-12-06 NOTE — Assessment & Plan Note (Signed)
-  resolved w/ free water (initially via IV, now via PEG

## 2013-12-06 NOTE — Assessment & Plan Note (Signed)
appears euvolemic at time of d/c - BP control is key

## 2013-12-06 NOTE — Assessment & Plan Note (Signed)
Patient high-risk of aspiration, completed antibiotics for 7 day course  -Continue patient n.p.o.; patient failed swallow study on 7/28  -Failed modified barium swallow 7/30  -Interventional radiology placed PEG/gastric tube 8/3 - tolerating tube feeds w/o signif complications at this time

## 2013-12-06 NOTE — Assessment & Plan Note (Signed)
-  crt stable but not yet back to baseline - follow renal function as outpt - baseline crt appears to be ~1.6-1.8 - new baseline may be 2.5

## 2013-12-06 NOTE — Assessment & Plan Note (Signed)
Out of control at presentation to hospital; now on clonidine 0.6mg , hydralazine 20 mg and metoprolol 25 BID

## 2013-12-06 NOTE — Assessment & Plan Note (Signed)
HB stable at 9.2

## 2013-12-06 NOTE — Assessment & Plan Note (Signed)
Cause of abd pain/ pain resolved

## 2013-12-06 NOTE — Assessment & Plan Note (Signed)
Prior pt very non compliant with diet;7/24 Hba1c 9.4; obviously not a problem going on now yet

## 2013-12-07 ENCOUNTER — Non-Acute Institutional Stay (SKILLED_NURSING_FACILITY): Payer: Medicare Other | Admitting: Internal Medicine

## 2013-12-07 DIAGNOSIS — Z515 Encounter for palliative care: Secondary | ICD-10-CM

## 2013-12-07 NOTE — Progress Notes (Signed)
MRN: [id number] Name: Allison Washington  Sex: female Age: 68 y.o. DOB: [birth year]  [facility]A Level Of Care: SNF Provider: Merrilee SeashoreALEXANDER, Ladeidra Borys D Emergency Contacts: Extended Emergency Contact Information Primary Emergency Contact: Morris,Kimberly Address: [address]          [place], [place] [id number] [place] Home Phone: [phone] Relation: Daughter Secondary Emergency Contact: Fransisco HertzWalters,David Address: [address]          [place], [place] [id number] [place] Home Phone: [phone] Relation: Son  Code Status: DNR at SNF currently  Allergies: Review of patient's allergies indicates no known allergies.  Chief Complaint  Patient presents with  . Medical Management of Chronic Issues    HPI: Patient is 68 y.o. female who was seen today with Cordelia PenSherry and ADON regarding end of life issues. Also pt spiked a fever of 102 last pm with no other sx. No fever today.  Past Medical History  Diagnosis Date  . 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 Diabetes mellitus     Prone to hypoglycemia  . Aphagia     Status post numerous  . Dysphagia   . Peripheral vascular disease, unspecified   . Arterial disease   . Depressive disorder   . Hypothyroid   . Seizures   . MRSA infection     History of MRSA in the left leg wound and sacrum in the past-has been seen by vascular surgery and declined any type of intervention including arteriogram  . Diverticulosis     GI bleed in 2007-s/p endoscopy and colonoscopy in 2007 s/p massive GIB consittent with Diveerticulosis  . DVT (deep venous thrombosis)     Not on Coumadin  . Anemia     Chronic  . Non-smoker   . Stroke   . Hypertension   . History of DVT (deep vein thrombosis) 03/06/2011  . Nursing home acquired MRSA infection 03/06/2011     History of MRSA in the left leg wound and sacrum in the past-has been seen by vascular surgery and declined any type of intervention including arteriogram   . SEIZURE DISORDER 12/17/2006    Qualifier: Diagnosis of  By: Clent RidgesFry MD, Tera MaterStephen A     Past Surgical History  Procedure Laterality Date  . Above knee leg amputation    . Femoral-popliteal bypass graft        Medication List       This list is accurate as of: 12/07/13 11:59 PM.  Always use your most recent med list.               acetaminophen 325 MG tablet  Commonly known as:  TYLENOL  Place 2 tablets (650 mg total) into feeding tube every 6 (six) hours as needed for fever (pain).     aspirin 81 MG tablet  Place 4 tablets (324 mg total) into feeding tube every morning.     cloNIDine 0.3 mg/24hr patch  Commonly known as:  CATAPRES - Dosed in mg/24 hr  Place 2 patches (0.6 mg total) onto the skin once a week.     feeding supplement (GLUCERNA 1.2 CAL) Liqd  Place 1,000 mLs into feeding tube continuous.     feeding supplement (PRO-STAT SUGAR FREE 64) Liqd  Place 30 mLs into feeding tube daily at 3 pm.  fish oil-omega-3 fatty acids 1000 MG capsule  Place 1 capsule (1 g total) into feeding tube 2 (two) times daily.     gemfibrozil 600 MG tablet  Commonly known as:  LOPID  Place 1 tablet (600 mg total) into feeding tube 2 (two) times daily before a meal.     hydrALAZINE 10 MG tablet  Commonly known as:  APRESOLINE  Place 2 tablets (20 mg total) into feeding tube 4 (four) times daily.     insulin aspart 100 UNIT/ML injection  Commonly known as:  novoLOG  Inject 0-15 Units into the skin every 4 (four) hours.     insulin glargine 100 UNIT/ML injection  Commonly known as:  LANTUS  Inject 0.3 mLs (30 Units total) into the skin daily.     levothyroxine 88 MCG tablet  Commonly known as:  SYNTHROID, LEVOTHROID  Place 1 tablet (88 mcg total) into feeding tube daily.     LORazepam 0.5 MG tablet  Commonly known as:   ATIVAN  Place 1 tablet (0.5 mg total) into feeding tube every 8 (eight) hours as needed for anxiety.     metoprolol tartrate 25 MG tablet  Commonly known as:  LOPRESSOR  Place 1 tablet (25 mg total) into feeding tube 2 (two) times daily.     oxyCODONE 20 MG/ML concentrated solution  Commonly known as:  ROXICODONE INTENSOL  Place 0.3 mLs (6 mg total) into feeding tube every 6 (six) hours as needed for severe pain.     pregabalin 25 MG capsule  Commonly known as:  LYRICA  Place 1 capsule (25 mg total) into feeding tube at bedtime.     rosuvastatin 40 MG tablet  Commonly known as:  CRESTOR  Place 1 tablet (40 mg total) into feeding tube at bedtime.        No orders of the defined types were placed in this encounter.    Immunization History  Administered Date(s) Administered  . Influenza-Unspecified 01/29/2013  . Pneumococcal-Unspecified 03/02/2011    History  Substance Use Topics  . Smoking status: Never Smoker   . Smokeless tobacco: Not on file  . Alcohol Use: No    Review of Systems  DATA OBTAINED: from patient with head nods so limited GENERAL: + fatigue, not hungry SKIN: No itching, rash HEENT: No complaint RESPIRATORY: No cough, wheezing, SOB CARDIAC: No chest pain, palpitations  GI: no sx  GU: no sx MUSCULOSKELETAL: No unrelieved bone/joint pain NEUROLOGIC; generalized weakness  Filed Vitals:   12/14/13 2144  BP: 137/77  Pulse: 76  Temp: 97.1 F (36.2 C)  Resp: 19    Physical Exam  GENERAL APPEARANCE: pt looked completely miserable  SKIN: No diaphoresis rash HEENT: Unremarkable RESPIRATORY: Breathing is even, unlabored. Lung sounds are clear   CARDIOVASCULAR: Heart RRR no murmurs, rubs or gallops. No peripheral edema  GASTROINTESTINAL: Abdomen is soft, non-tender, not distended w/ normal bowel soundsPEG  GENITOURINARY: Bladder non tender, not distended  MUSCULOSKELETAL: s/p CVA; s/p AKA NEUROLOGIC: Cranial nerves 2-12 grossly  intact PSYCHIATRIC: appeared miserable  Patient Active Problem List   Diagnosis Date Noted  . Encounter for end of life care 12/14/2013  . NSTEMI (non-ST elevated myocardial infarction) 12/06/2013  . Metabolic acidosis 12/06/2013  . Acute encephalopathy 12/06/2013  . Aspiration pneumonia 12/06/2013  . Hypernatremia 12/06/2013  . Hypokalemia 12/06/2013  . PEG (percutaneous endoscopic gastrostomy) status 12/06/2013  . Acute renal failure superimposed on stage 3 chronic kidney disease 11/21/2013  . NASH (nonalcoholic steatohepatitis) 11/21/2013  .  Anemia of chronic disease 11/21/2013  . Acute on chronic diastolic heart failure 11/21/2013  . Unspecified hypothyroidism 04/07/2013  . Neuropathy 02/02/2013  . Other and unspecified hyperlipidemia 10/20/2012  . CHF (congestive heart failure) 02/09/2012  . CVA (cerebral vascular accident) 03/06/2011  . S/P AKA (above knee amputation) unilateral 03/06/2011  . Diabetes mellitus with neurological manifestations, uncontrolled 12/17/2006  . HYPERTENSION 12/17/2006  . SEIZURE DISORDER 12/17/2006  . URINARY INCONTINENCE 12/17/2006    CBC  8/7  WBC 10.5, 7.8/24.4  PLT 239    Component Value Date/Time   WBC 15.5* 12/02/2013 0255   RBC 3.28* 12/02/2013 0255   HGB 9.2* 12/02/2013 0255   HCT 30.4* 12/02/2013 0255   PLT 302 12/02/2013 0255   MCV 92.7 12/02/2013 0255   LYMPHSABS 2.9 11/30/2013 0220   MONOABS 1.1* 11/30/2013 0220   EOSABS 0.2 11/30/2013 0220   BASOSABS 0.0 11/30/2013 0220    CMP 8/7  136,3.6, 85, 40, 119, 56/2.67, Ca 8.0   LDL 59, HDL 41, TG 198    Component Value Date/Time   NA 141 12/02/2013 0255   K 3.2* 12/02/2013 0255   CL 86* 12/02/2013 0255   CO2 41* 12/02/2013 0255   GLUCOSE 220* 12/02/2013 0255   BUN 44* 12/02/2013 0255   CREATININE 2.49* 12/02/2013 0255   CALCIUM 8.0* 12/02/2013 0255   CALCIUM 9.4 11/17/2007 1530   PROT 5.7* 11/30/2013 0220   ALBUMIN 2.5* 11/30/2013 0220   AST 35 11/30/2013 0220   ALT 20 11/30/2013 0220   ALKPHOS 58 11/30/2013 0220    BILITOT 0.3 11/30/2013 0220   GFRNONAA 19* 12/02/2013 0255   GFRAA 22* 12/02/2013 0255    Assessment and Plan  Encounter for end of life care Tamera Reason and I spoke with patient in her room. I had read in EPIC that pt was a FULL code and that there had been some alluded to disagreements between son and daughter and pt over PEG tube. Cordelia Pen had in her possession a DNR filled out 8/4 at the hospital after the PEG was placed and making the daughter POA. This was obviously a huge discrepancy so we went to speak to patient herself. Sherry asked her if she wanted the PEG tube and she nodded NO. Cordelia Pen asked her if she understand that if she did not get feeding through the tube that she would die. And she nodded YES. I asked her if she was ready to [facility] (staff had told me that she had intimated that she was ready) and she nodded YES. Cordelia Pen asked her if she knew that she had been made a full code at the hospital and she drew back and looked horrified that this had happened. 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 She looked so sad and unhappy, unlike her pre-hospital self.Cordelia Pen called patient's daughter and they made an appt to meet the next day at 40 to discuss this again together with the patient. The DNR Cordelia Pen had in her possession would stand until the meeting the next day.   Time spent researching in EPIC, speaking with staff and speaking with pt >> 45 minutes Margit Hanks, MD

## 2013-12-14 DIAGNOSIS — Z515 Encounter for palliative care: Secondary | ICD-10-CM | POA: Insufficient documentation

## 2013-12-14 NOTE — Assessment & Plan Note (Addendum)
Tamera ReasonSherry, Mary Porter and I spoke with patient in her room. I had read in EPIC that pt was a FULL code and that there had been some alluded to disagreements between son and daughter and pt over PEG tube. Cordelia PenSherry had in her possession a DNR filled out 8/4 at the hospital after the PEG was placed and making the daughter POA. This was obviously a huge discrepancy so we went to speak to patient herself. Sherry asked her if she wanted the PEG tube and she nodded NO. Cordelia PenSherry asked her if she understand that if she did not get feeding through the tube that she would die. And she nodded YES. I asked her if she was ready to go [facility] (staff had told me that she had intimated that she was ready) and she nodded YES. Cordelia PenSherry asked her if she knew that she had been made a full code at the hospital and she drew back and looked horrified that this had happened. 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 She looked so sad and unhappy, unlike her pre-hospital self.Cordelia PenSherry called patient's daughter and they made an appt to meet the next day at [id number] to discuss this again together with the patient. The DNR Cordelia PenSherry had in her possession would stand until the meeting the next day.

## 2013-12-17 ENCOUNTER — Encounter: Payer: Self-pay | Admitting: Internal Medicine

## 2013-12-29 DEATH — deceased

## 2014-10-14 IMAGING — CT CT HEAD W/O CM
1 series · 16 of 30 positions shown, 20 images · non-contrast
Comparison: 07/13/2007

CLINICAL DATA: Loss of consciousness, history of stroke

EXAM:
CT HEAD WITHOUT CONTRAST
TECHNIQUE: Contiguous axial images were obtained from the base of the skull
through the vertex without intravenous contrast.

[Series 2: head 5.0 h30s · axial · 0.47mm/px · z∈[-107,+43]mm · 16 of 34 slices shown, 20 images]
[im 2/34  brain]
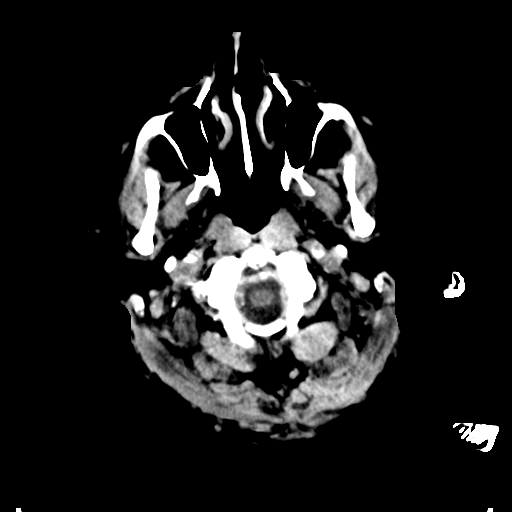
[im 2/34  bone]
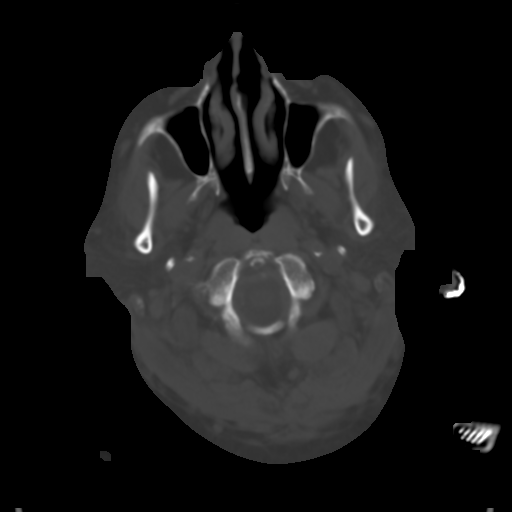
[im 4/34  brain]
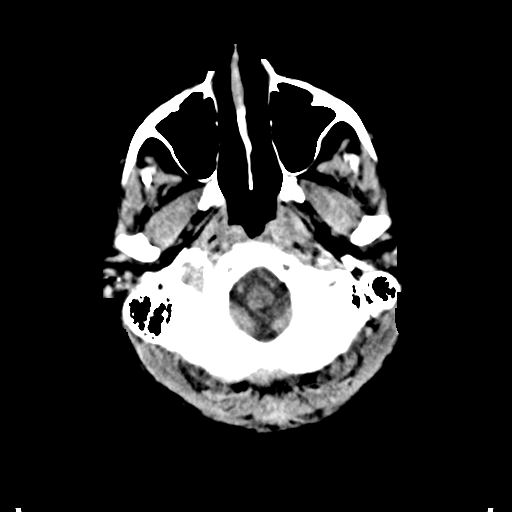
[im 6/34  brain]
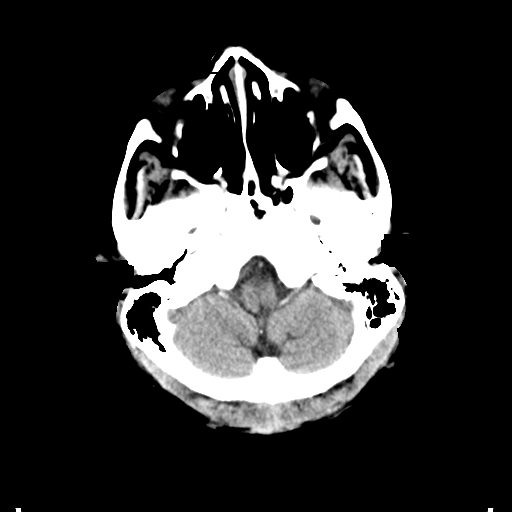
[im 8/34  brain]
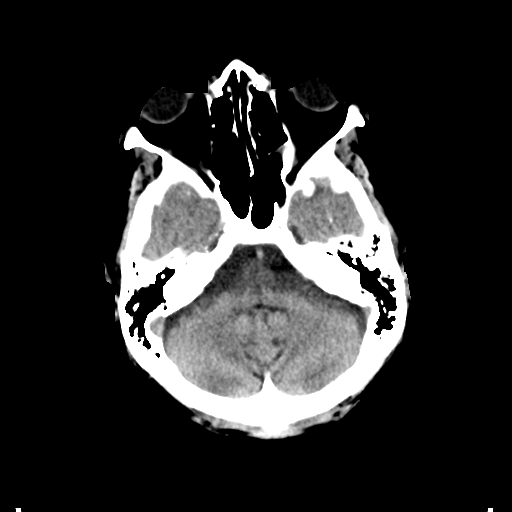
[im 10/34  brain]
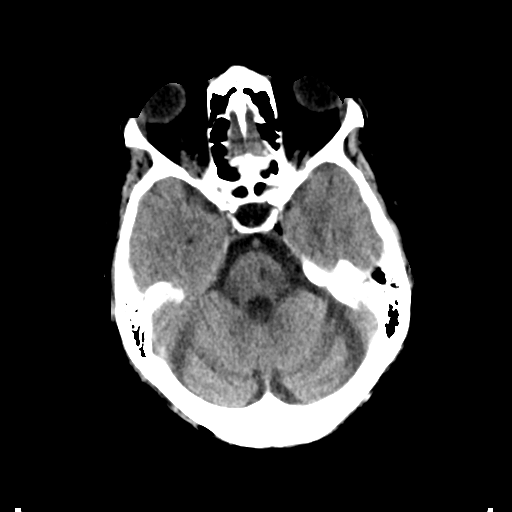
[im 10/34  bone]
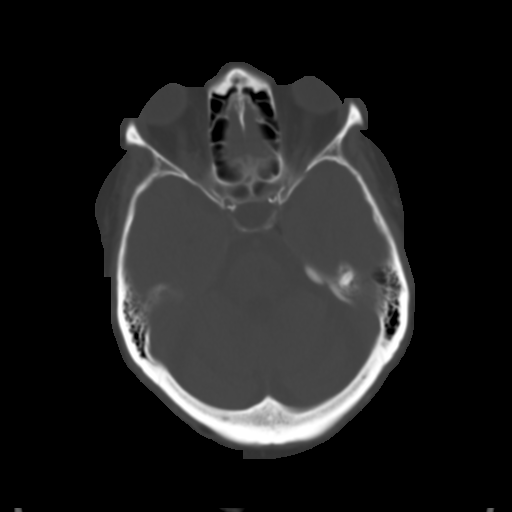
[im 12/34  brain]
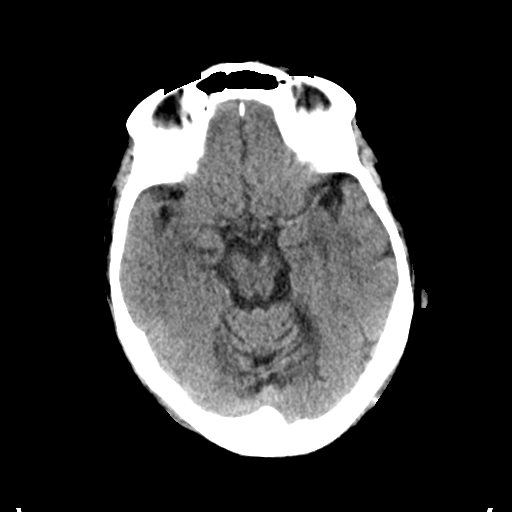
[im 14/34  brain]
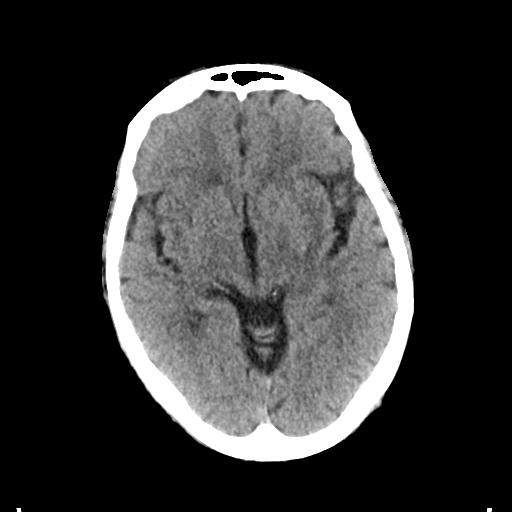
[im 16/34  brain]
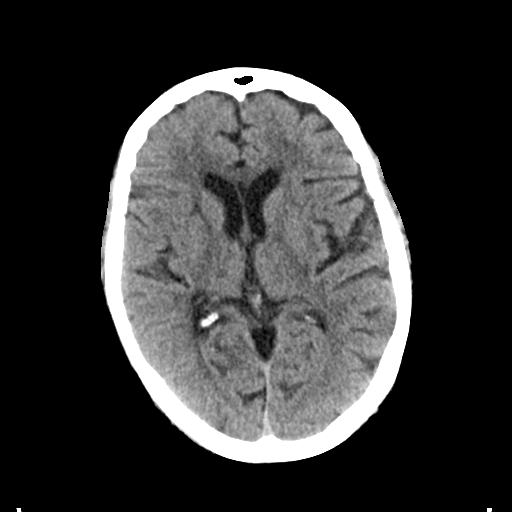
[im 18/34  brain]
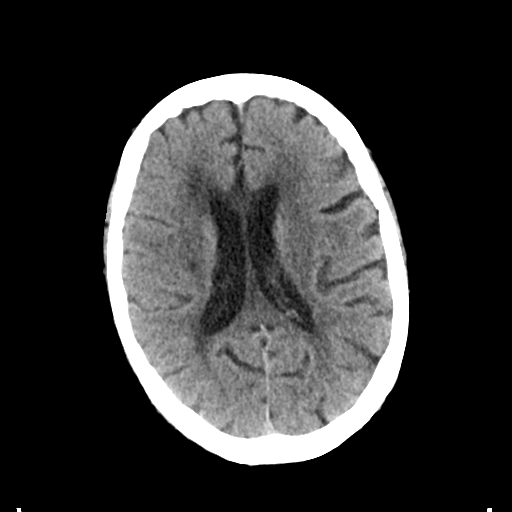
[im 18/34  bone]
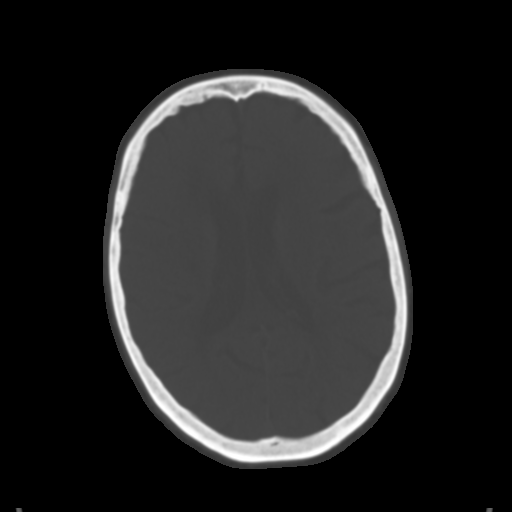
[im 20/34  brain]
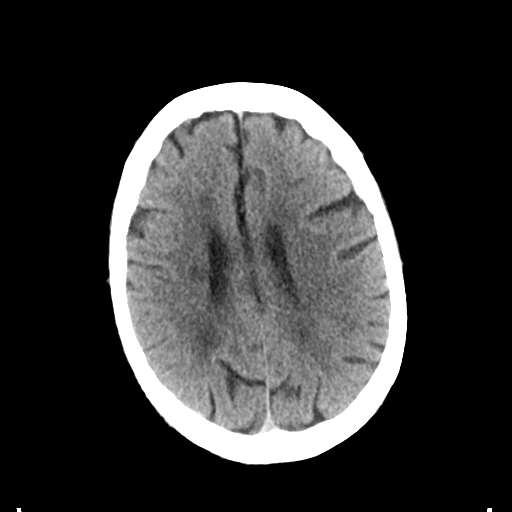
[im 22/34  brain]
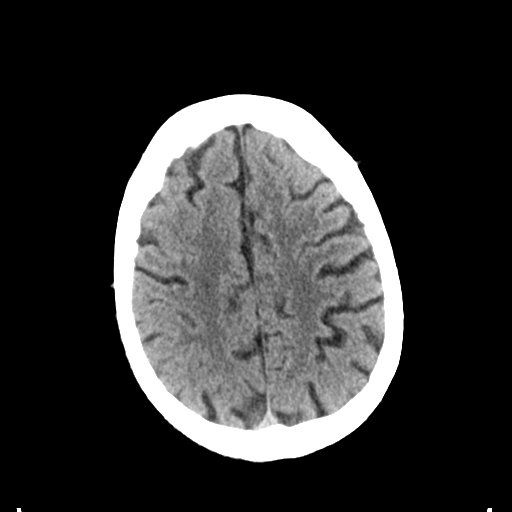
[im 24/34  brain]
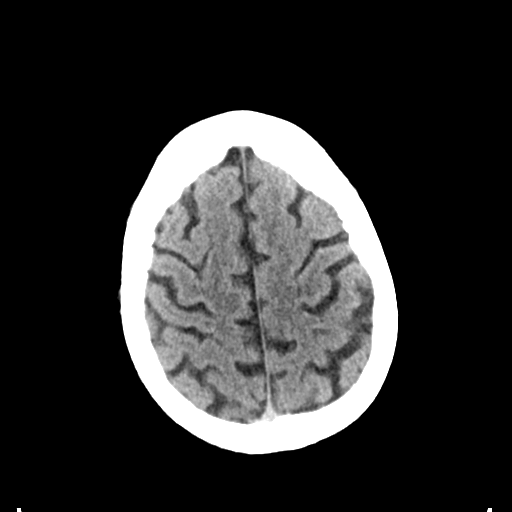
[im 26/34  brain]
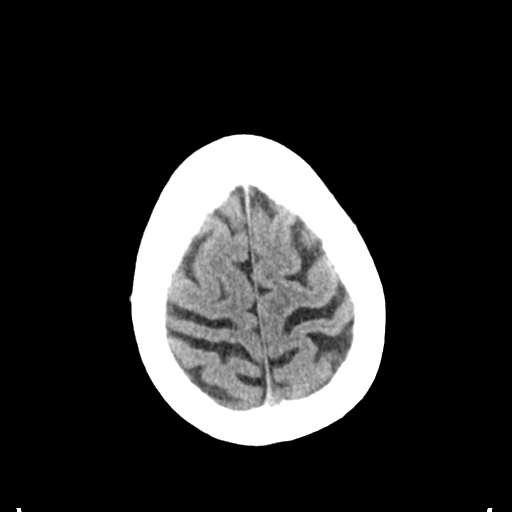
[im 26/34  bone]
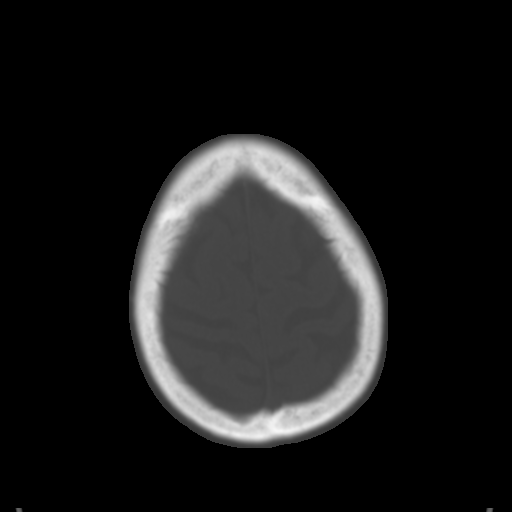
[im 28/34  brain]
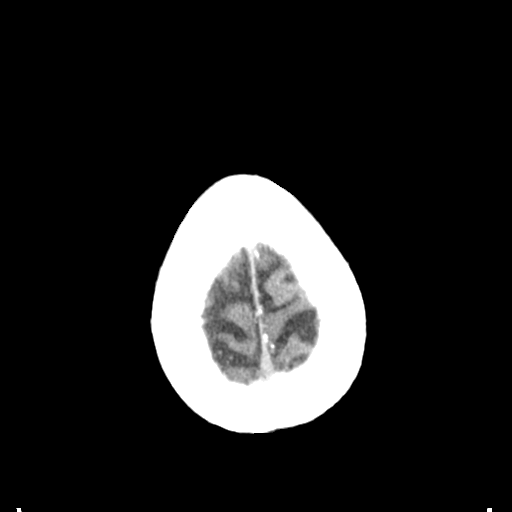
[im 30/34  brain]
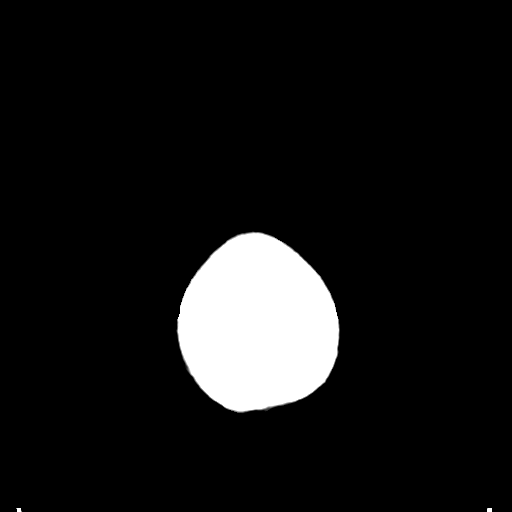
[im 32/34  brain]
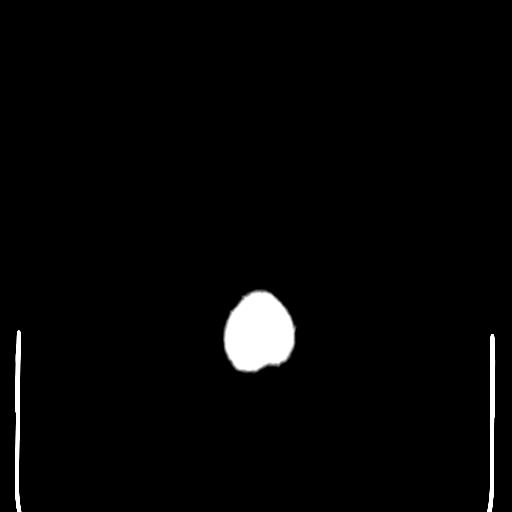

[16 of 30 positions shown; findings below may reference images not displayed]

FINDINGS: Mild to moderate diffuse atrophy. Moderate low attenuation in the
deep white matter. Evidence of stable chronic lacunar infarct in the
region of the bilateral basal ganglia. Tiny lacunar infarct deep
periventricular white matter image number 18, also stable. Stable
lacunar infarct in the pons. No evidence of vascular territory
infarct. No evidence of hemorrhage or extra-axial fluid. No
hydrocephalus. Calvarium is intact.
IMPRESSION: Chronic involutional change and chronic lacunar infarcts, stable
from prior study. No acute findings.

## 2014-10-15 IMAGING — CR DG CHEST 1V PORT
1 series · 1 of 1 positions shown · non-contrast
Comparison: November 21, 2013

CLINICAL DATA: Fluid overload

EXAM:
PORTABLE CHEST - 1 VIEW

[AP]
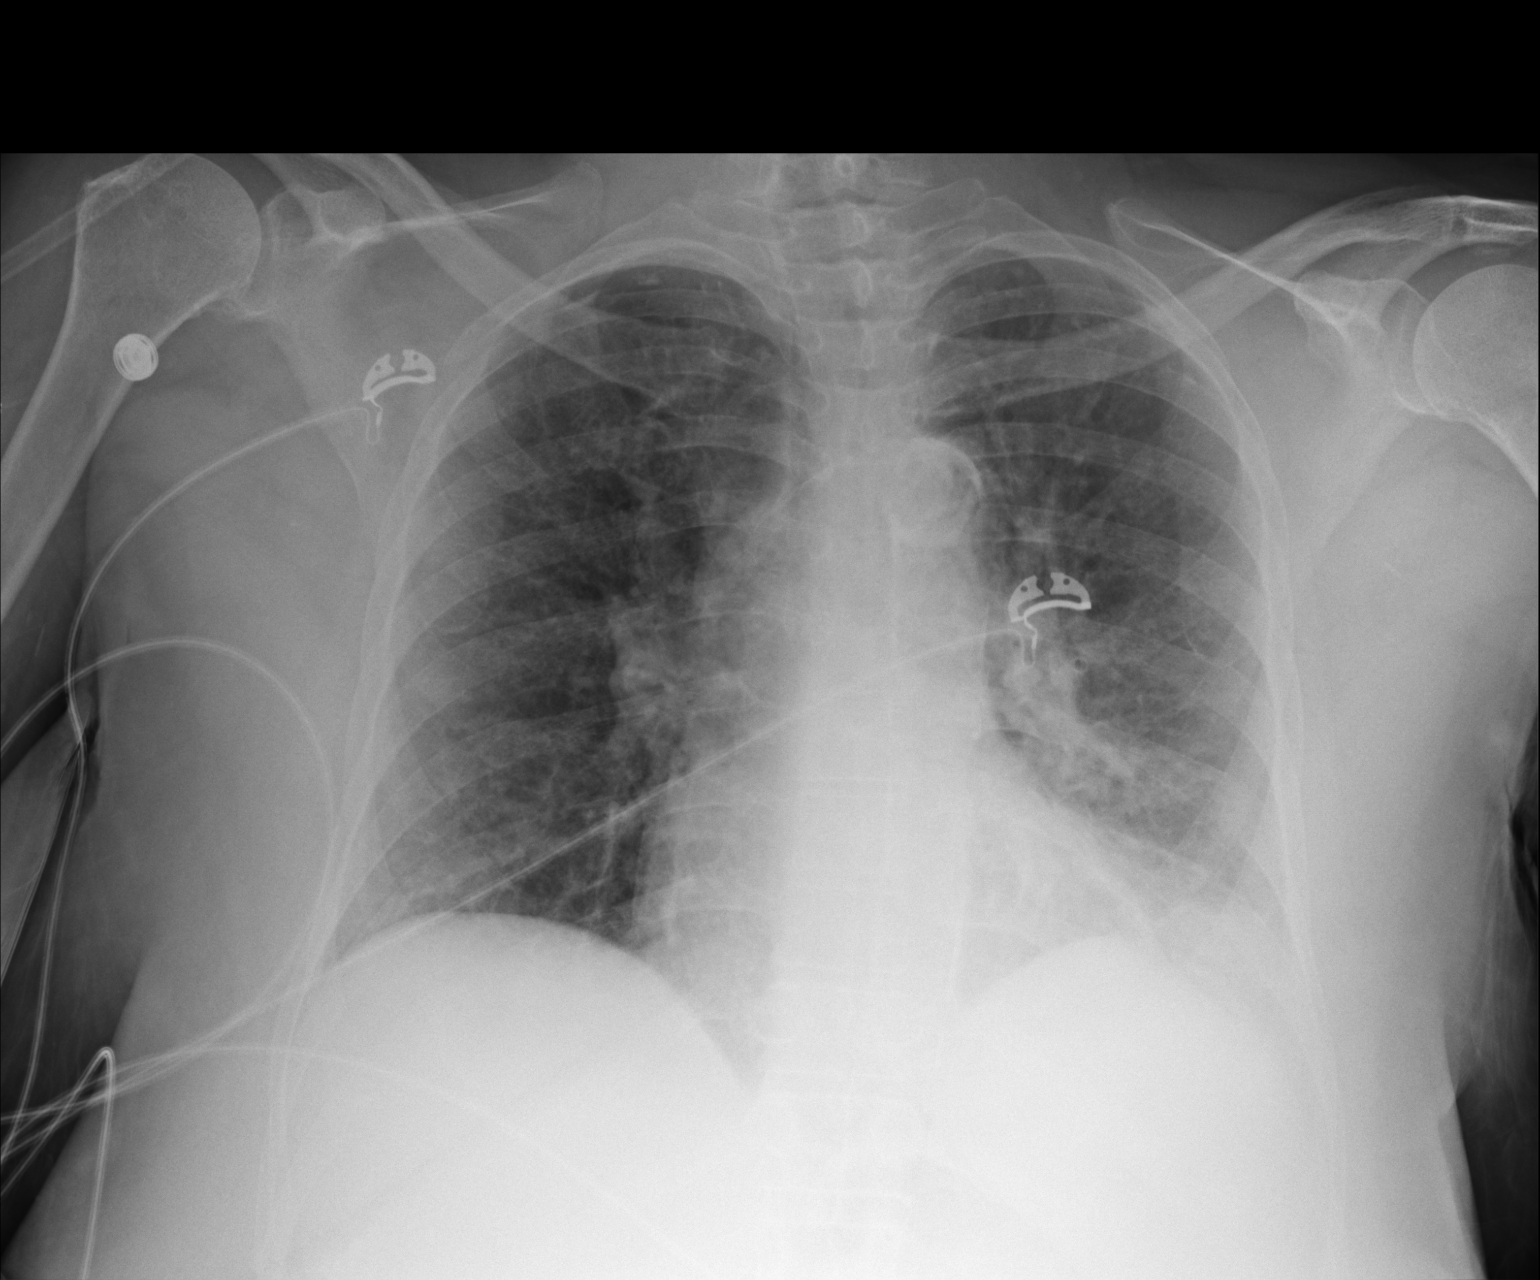

[1 of 1 positions shown; findings below may reference images not displayed]

FINDINGS: There is patchy infiltrate in the left base. Elsewhere lungs are
clear. Heart size and pulmonary vascularity are normal. No
adenopathy. There is atherosclerotic change in the aorta.
IMPRESSION: Patchy infiltrate left base.

## 2014-10-17 IMAGING — CR DG ABD PORTABLE 1V
1 series · 1 of 1 positions shown · non-contrast
Comparison: CT abdomen pelvis dated 11/21/2013

CLINICAL DATA: Panda placement

EXAM:
PORTABLE ABDOMEN - 1 VIEW

[AP]
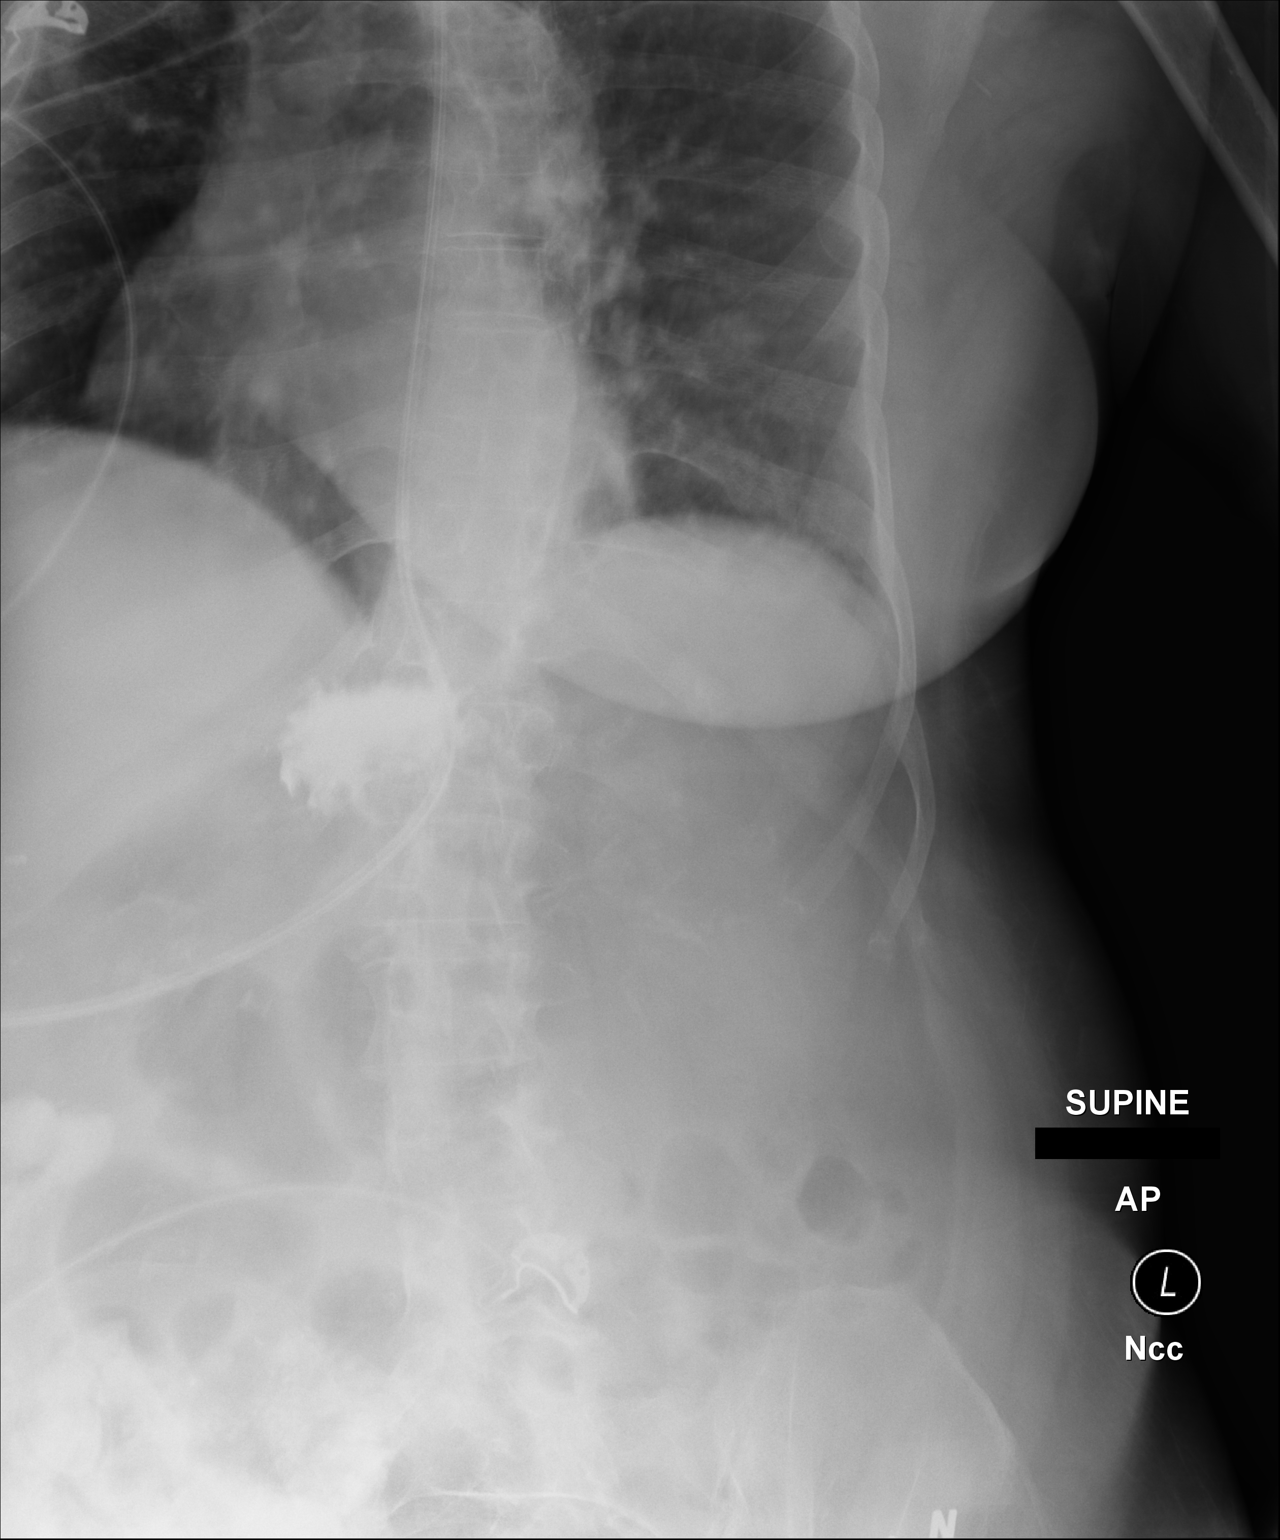

[1 of 1 positions shown; findings below may reference images not displayed]

FINDINGS: Weighted feeding tube terminates in the proximal duodenum.

Contrast in the gastric cardia.

Nonspecific bowel gas pattern, without disproportionate small bowel
dilatation to suggest small bowel obstruction.
IMPRESSION: Weighted feeding tube terminates in the proximal duodenum.

## 2014-10-17 IMAGING — CR DG CHEST 1V PORT
1 series · 1 of 1 positions shown · non-contrast
Comparison: 11/21/2013.

CLINICAL DATA: Wrist drawer E failure.  Followup chest radiograph.

EXAM:
PORTABLE CHEST - 1 VIEW

[AP]
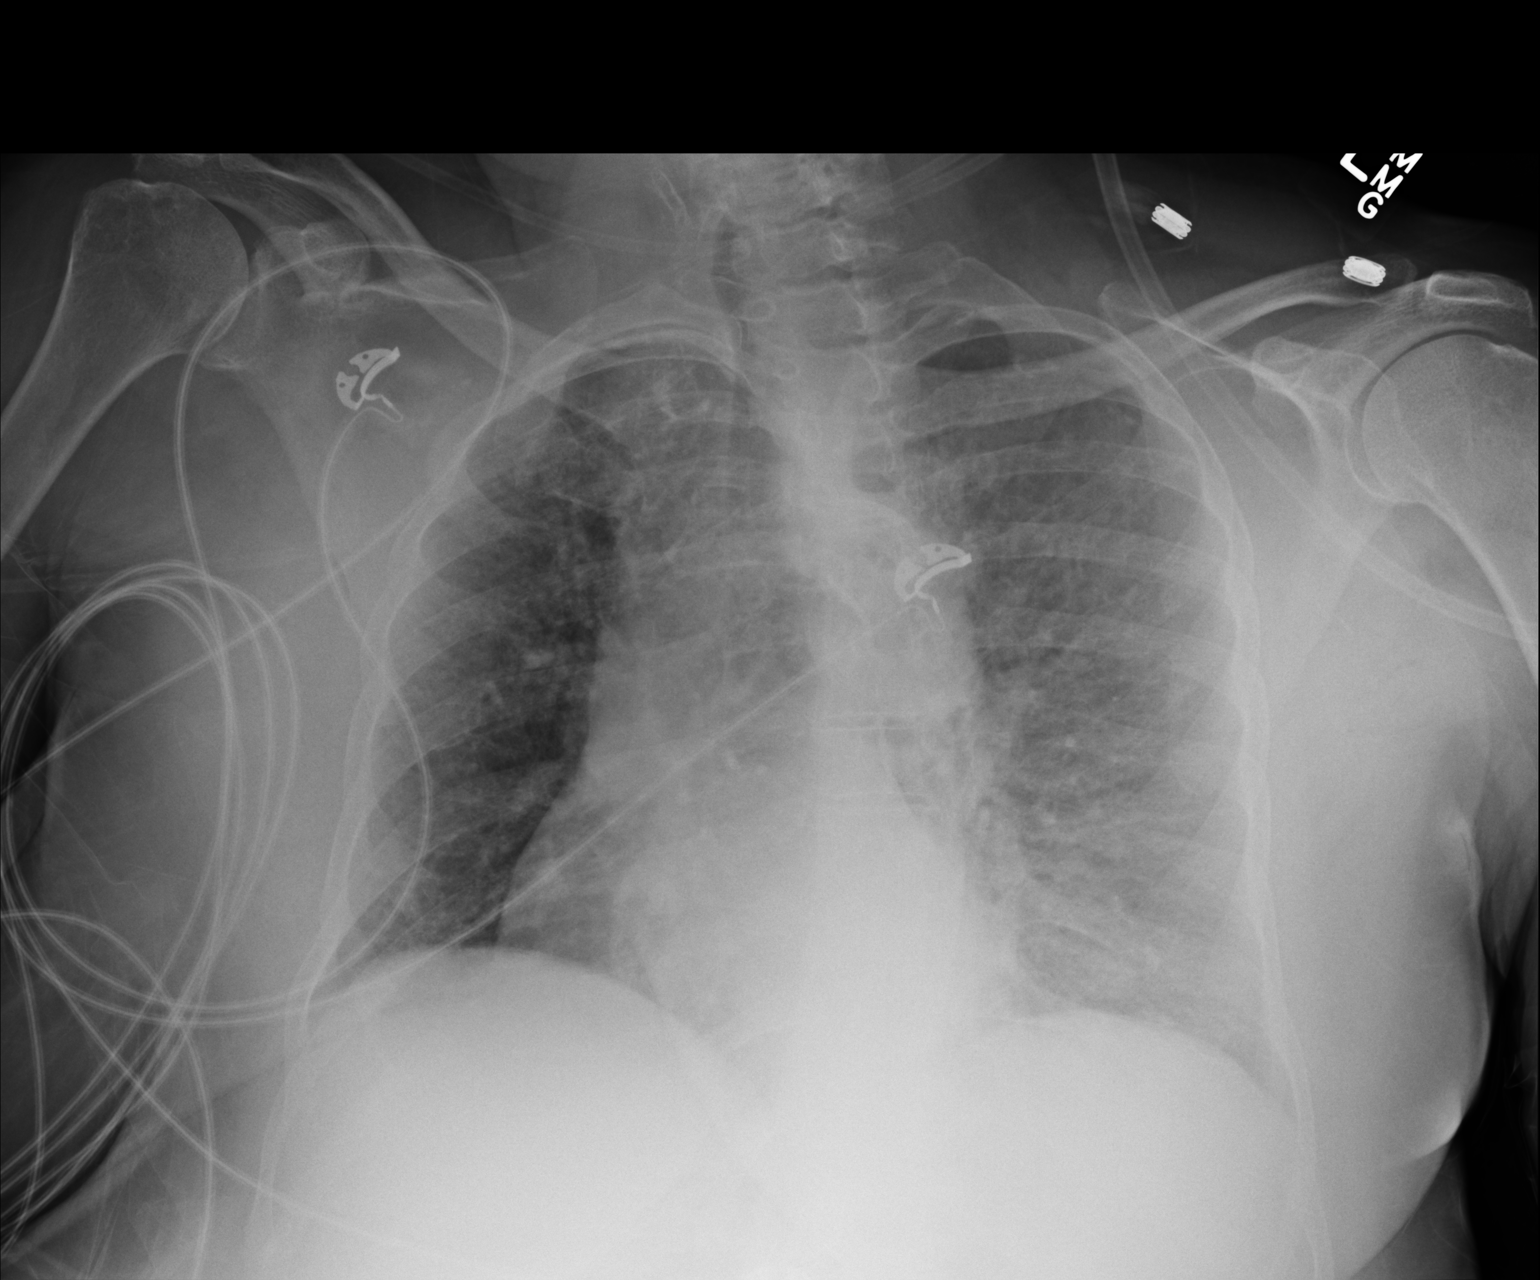

[1 of 1 positions shown; findings below may reference images not displayed]

FINDINGS: Support apparatus: Monitoring leads project over the chest.

Cardiomediastinal Silhouette: Unchanged. Patient is rotated to the
RIGHT.

Lungs: Improved aeration at the LEFT lung base with mild residual
LEFT basilar airspace disease or atelectasis. No pneumothorax.

Effusions:  None.

Other:  None.
IMPRESSION: Improved pulmonary aeration at the LEFT lung base, with mild
residual airspace disease or atelectasis at the LEFT base.
